# Patient Record
Sex: Female | Born: 1950 | Race: Black or African American | Hispanic: No | Marital: Single | Smoking: Never smoker
Health system: Southern US, Community
[De-identification: ages and names within clinical notes are randomized; demographics above are authoritative.]

## PROBLEM LIST (undated history)

## (undated) DIAGNOSIS — N289 Disorder of kidney and ureter, unspecified: Secondary | ICD-10-CM

## (undated) DIAGNOSIS — J45909 Unspecified asthma, uncomplicated: Secondary | ICD-10-CM

## (undated) DIAGNOSIS — E119 Type 2 diabetes mellitus without complications: Secondary | ICD-10-CM

## (undated) DIAGNOSIS — I10 Essential (primary) hypertension: Secondary | ICD-10-CM

## (undated) DIAGNOSIS — H548 Legal blindness, as defined in USA: Secondary | ICD-10-CM

## (undated) HISTORY — PX: HYSTERECTOMY ABDOMINAL WITH SALPINGECTOMY: SHX6725

## (undated) HISTORY — PX: LEG SURGERY: SHX1003

## (undated) HISTORY — PX: ABDOMINAL HYSTERECTOMY: SHX81

## (undated) SURGERY — A/V SHUNTOGRAM/FISTULAGRAM
Anesthesia: Moderate Sedation | Laterality: Left

---

## 1999-08-12 HISTORY — PX: OTHER SURGICAL HISTORY: SHX169

## 2014-12-21 ENCOUNTER — Other Ambulatory Visit
Admission: RE | Admit: 2014-12-21 | Discharge: 2014-12-21 | Disposition: A | Discharge status: Home / Self Care | Payer: Medicare Other | Attending: Nephrology | Admitting: Nephrology

## 2014-12-21 DIAGNOSIS — D689 Coagulation defect, unspecified: Secondary | ICD-10-CM | POA: Diagnosis present

## 2014-12-21 DIAGNOSIS — N186 End stage renal disease: Secondary | ICD-10-CM | POA: Insufficient documentation

## 2014-12-21 LAB — PROTIME-INR
INR: 2.01
Prothrombin Time: 22.9 seconds — ABNORMAL HIGH (ref 11.4–15.0)

## 2015-01-08 ENCOUNTER — Emergency Department
Admission: EM | Admit: 2015-01-08 | Discharge: 2015-01-08 | Disposition: A | Discharge status: Home / Self Care | Payer: Medicare Other | Attending: Emergency Medicine | Admitting: Emergency Medicine

## 2015-01-08 ENCOUNTER — Encounter: Payer: Self-pay | Admitting: Emergency Medicine

## 2015-01-08 DIAGNOSIS — N186 End stage renal disease: Secondary | ICD-10-CM | POA: Diagnosis not present

## 2015-01-08 DIAGNOSIS — Z992 Dependence on renal dialysis: Secondary | ICD-10-CM | POA: Insufficient documentation

## 2015-01-08 DIAGNOSIS — T82591A Other mechanical complication of surgically created arteriovenous shunt, initial encounter: Secondary | ICD-10-CM

## 2015-01-08 DIAGNOSIS — I120 Hypertensive chronic kidney disease with stage 5 chronic kidney disease or end stage renal disease: Secondary | ICD-10-CM | POA: Diagnosis not present

## 2015-01-08 DIAGNOSIS — Y841 Kidney dialysis as the cause of abnormal reaction of the patient, or of later complication, without mention of misadventure at the time of the procedure: Secondary | ICD-10-CM | POA: Diagnosis not present

## 2015-01-08 DIAGNOSIS — T8242XA Displacement of vascular dialysis catheter, initial encounter: Secondary | ICD-10-CM | POA: Diagnosis present

## 2015-01-08 DIAGNOSIS — E119 Type 2 diabetes mellitus without complications: Secondary | ICD-10-CM | POA: Insufficient documentation

## 2015-01-08 HISTORY — DX: Type 2 diabetes mellitus without complications: E11.9

## 2015-01-08 HISTORY — DX: Legal blindness, as defined in USA: H54.8

## 2015-01-08 HISTORY — DX: Disorder of kidney and ureter, unspecified: N28.9

## 2015-01-08 HISTORY — DX: Essential (primary) hypertension: I10

## 2015-01-08 HISTORY — DX: Unspecified asthma, uncomplicated: J45.909

## 2015-01-08 LAB — CBC WITH DIFFERENTIAL/PLATELET
BASOS ABS: 0 10*3/uL (ref 0–0.1)
BASOS PCT: 1 %
Eosinophils Absolute: 0.3 10*3/uL (ref 0–0.7)
Eosinophils Relative: 5 %
HCT: 36.6 % (ref 35.0–47.0)
Hemoglobin: 11.5 g/dL — ABNORMAL LOW (ref 12.0–16.0)
LYMPHS ABS: 1.1 10*3/uL (ref 1.0–3.6)
LYMPHS PCT: 19 %
MCH: 30.1 pg (ref 26.0–34.0)
MCHC: 31.4 g/dL — ABNORMAL LOW (ref 32.0–36.0)
MCV: 96 fL (ref 80.0–100.0)
Monocytes Absolute: 0.4 10*3/uL (ref 0.2–0.9)
Monocytes Relative: 7 %
Neutro Abs: 4 10*3/uL (ref 1.4–6.5)
Neutrophils Relative %: 68 %
Platelets: 121 10*3/uL — ABNORMAL LOW (ref 150–440)
RBC: 3.82 MIL/uL (ref 3.80–5.20)
RDW: 17.4 % — ABNORMAL HIGH (ref 11.5–14.5)
WBC: 5.8 10*3/uL (ref 3.6–11.0)

## 2015-01-08 LAB — BASIC METABOLIC PANEL
Anion gap: 13 (ref 5–15)
BUN: 64 mg/dL — ABNORMAL HIGH (ref 6–20)
CO2: 22 mmol/L (ref 22–32)
CREATININE: 8.33 mg/dL — AB (ref 0.44–1.00)
Calcium: 7.8 mg/dL — ABNORMAL LOW (ref 8.9–10.3)
Chloride: 91 mmol/L — ABNORMAL LOW (ref 101–111)
GFR calc Af Amer: 5 mL/min — ABNORMAL LOW (ref >60)
GFR calc non Af Amer: 4 mL/min — ABNORMAL LOW (ref >60)
GLUCOSE: 235 mg/dL — AB (ref 65–99)
POTASSIUM: 3.9 mmol/L (ref 3.5–5.1)
SODIUM: 126 mmol/L — AB (ref 135–145)
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
## 2015-01-08 NOTE — ED Notes (Signed)
Respiratory at bedside for blood draw and arterial stick. Family updated.

## 2015-01-08 NOTE — Discharge Instructions (Signed)
Your case was discussed with vascular surgery.  You need to return to the hospital tomorrow so that they can address the issues with the shunt. Tomorrow they can de-clotted the shunt. Return at 8 AM to preprocedure area here at [facility]. If you have any difficulty making at that time you need to call the vascular surgeon's office. The phone number is included in these instructions.  Return to the emergency department if you have any other urgent concerns.  Dialysis Vascular Access Malfunction A vascular access is an entrance to your blood vessels that can be used for dialysis. A vascular access can be made in one of several ways:   Joining an artery to a vein under your skin to make a bigger blood vessel called a fistula.   Joining an artery to a vein under your skin using a soft tube called a graft.   Placing a thin, flexible tube (catheter) in a large vein, usually in your neck.  A vascular access may malfunction or become blocked.  WHAT CAN CAUSE YOUR VASCULAR ACCESS TO MALFUNCTION?  Infection (common).   A blood clot inside a part of the fistula, graft, or catheter. A blood clot can completely or partially block the flow of blood.   A kink in the graft or catheter.   A collection of blood (called a hematoma or bruise) next to the graft or catheter that pushes against it, blocking the flow of blood.  WHAT ARE SIGNS AND SYMPTOMS OF VASCULAR ACCESS MALFUNCTION?  There is a change in the vibration or pulse of your fistula or graft.  The vibration or pulse of your fistula or graft is gone.   There is new or unusual swelling of the area around the access.   There was an unsuccessful puncture of your access by the dialysis team.   The flow of blood through the fistula, graft, or catheter is too slow for effective dialysis.   When routine dialysis is completed and the needle is removed, bleeding lasts for too long a time.  WHAT HAPPENS IF MY VASCULAR ACCESS  MALFUNCTIONS? Your health care provider may order blood work, cultures, or an X-ray test in order to learn what may be wrong with your vascular access. The X-ray test involves the injection of a liquid into the vascular access. The liquid shows up on the X-ray and allows your health care provider to see if there is a blockage in the vascular access.  Treatment varies depending on the cause of the malfunction:   If the vascular access is infected, your health care provider may prescribe antibiotic medicine to control the infection.   If a clot is found in the vascular access, you may need surgery to remove the clot.   If a blockage in the vascular access is due to some other cause (such as a kink in a graft), then you will likely need surgery to unblock or replace the graft.  HOME CARE INSTRUCTIONS: Follow up with your surgeon or other health care provider if you were instructed to do so. This is very important. Any delay in follow-up could cause permanent dysfunction of the vascular access, which may be dangerous.  SEEK MEDICAL CARE IF:   Fever develops.   Swelling and pain around the vascular access gets worse or new pain develops.  Pain, numbness, or an unusual pale skin color develops in the hand on the side of your vascular access. SEEK IMMEDIATE MEDICAL CARE IF: Unusual bleeding develops at the  location of the vascular access. MAKE SURE YOU:  Understand these instructions.  Will watch your condition.  Will get help right away if you are not doing well or get worse. Document Released: 06/30/2006 Document Revised: 12/12/2013 Document Reviewed: 12/30/2012 [facility] Patient Information 2015 [place], [place]. This information is not intended to replace advice given to you by your health care provider. Make sure you discuss any questions you have with your health care provider.

## 2015-01-08 NOTE — ED Provider Notes (Signed)
Recent Results (from the past 2160 hour(s))  Protime-INR     Status: Abnormal   Collection Time: 12/21/14 11:16 AM  Result Value Ref Range   Prothrombin Time 22.9 (H) 11.4 - 15.0 seconds   INR 2.01   Basic metabolic panel     Status: Abnormal   Collection Time: 01/08/15  5:18 PM  Result Value Ref Range   Sodium 126 (L) 135 - 145 mmol/L   Potassium 3.9 3.5 - 5.1 mmol/L   Chloride 91 (L) 101 - 111 mmol/L   CO2 22 22 - 32 mmol/L   Glucose, Bld 235 (H) 65 - 99 mg/dL   BUN 64 (H) 6 - 20 mg/dL   Creatinine, Ser 8.33 (H) 0.44 - 1.00 mg/dL   Calcium 7.8 (L) 8.9 - 10.3 mg/dL   GFR calc non Af Amer 4 (L) >60 mL/min   GFR calc Af Amer 5 (L) >60 mL/min    Comment: (NOTE) The eGFR has been calculated using the CKD EPI equation. This calculation has not been validated in all clinical situations. eGFR's persistently <60 mL/min signify possible Chronic Kidney Disease.    Anion gap 13 5 - 15  Blood gas, arterial     Status: Abnormal (Preliminary result)   Collection Time: 01/08/15  5:40 PM  Result Value Ref Range   FIO2 0.21 %   pH, Arterial 7.44 7.350 - 7.450   pCO2 arterial 37 32.0 - 48.0 mmHg   pO2, Arterial 101 83.0 - 108.0 mmHg   Bicarbonate 25.1 21.0 - 28.0 mEq/L   Acid-Base Excess 1.1 0.0 - 3.0 mmol/L   O2 Saturation 98.0 %   Patient temperature 37.0    Collection site RIGHT RADIAL    Sample type ARTERIAL DRAW    Allens test (pass/fail) POSITIVE (A) PASS   Mechanical Rate PENDING     Patient received in checkout by Dr. Kaminski, labs are stable for her to follow-up in the morning to have her shunt declotted. She is no acute distress and is agreeable with this plan. Vital signs are stable.  Jonathan E Williams, MD 01/08/15 1801 

## 2015-01-09 ENCOUNTER — Encounter: Payer: Self-pay | Admitting: *Deleted

## 2015-01-09 ENCOUNTER — Ambulatory Visit
Admission: RE | Admit: 2015-01-09 | Discharge: 2015-01-09 | Disposition: A | Discharge status: Home / Self Care | Payer: Medicare Other | Source: Ambulatory Visit | Attending: Vascular Surgery | Admitting: Vascular Surgery

## 2015-01-09 DIAGNOSIS — N186 End stage renal disease: Secondary | ICD-10-CM | POA: Insufficient documentation

## 2015-01-09 LAB — BLOOD GAS, ARTERIAL
ACID-BASE EXCESS: 1.1 mmol/L (ref 0.0–3.0)
Allens test (pass/fail): POSITIVE — AB
BICARBONATE: 25.1 meq/L (ref 21.0–28.0)
FIO2: 0.21 %
O2 SAT: 98 %
PATIENT TEMPERATURE: 37
PCO2 ART: 37 mmHg (ref 32.0–48.0)
PH ART: 7.44 (ref 7.350–7.450)
PO2 ART: 101 mmHg (ref 83.0–108.0)

## 2015-01-09 LAB — BASIC METABOLIC PANEL
Anion gap: 15 (ref 5–15)
BUN: 78 mg/dL — ABNORMAL HIGH (ref 6–20)
CALCIUM: 8.6 mg/dL — AB (ref 8.9–10.3)
CO2: 26 mmol/L (ref 22–32)
Chloride: 97 mmol/L — ABNORMAL LOW (ref 101–111)
Creatinine, Ser: 9.66 mg/dL — ABNORMAL HIGH (ref 0.44–1.00)
GFR calc Af Amer: 4 mL/min — ABNORMAL LOW (ref >60)
GFR calc non Af Amer: 4 mL/min — ABNORMAL LOW (ref >60)
Glucose, Bld: 143 mg/dL — ABNORMAL HIGH (ref 65–99)
Potassium: 4.7 mmol/L (ref 3.5–5.1)
Sodium: 138 mmol/L (ref 135–145)

## 2015-01-09 LAB — PROTIME-INR
INR: 1.28
PROTHROMBIN TIME: 16.2 s — AB (ref 11.4–15.0)

## 2015-01-09 MED ORDER — SODIUM CHLORIDE 0.9 % IV SOLN: INTRAVENOUS | Status: DC

## 2015-01-09 MED ORDER — HYDROMORPHONE HCL 1 MG/ML IJ SOLN: 1.0000 mg | INTRAMUSCULAR | Status: DC | PRN

## 2015-01-09 MED ORDER — ONDANSETRON HCL 4 MG/2ML IJ SOLN: 4.0000 mg | INTRAMUSCULAR | Status: DC | PRN

## 2015-01-09 MED ORDER — CEFAZOLIN SODIUM 1-5 GM-% IV SOLN: 1.0000 g | Freq: Once | INTRAVENOUS | Status: DC

## 2015-01-09 MED ORDER — DEXTROSE 50 % IV SOLN: 0.5000 | Freq: Once | INTRAVENOUS | Status: DC | PRN

## 2015-01-10 ENCOUNTER — Encounter: Payer: Self-pay | Admitting: *Deleted

## 2015-01-10 ENCOUNTER — Ambulatory Visit
Admission: RE | Admit: 2015-01-10 | Discharge: 2015-01-10 | Disposition: A | Discharge status: Home / Self Care | Payer: Medicare Other | Source: Ambulatory Visit | Attending: Vascular Surgery | Admitting: Vascular Surgery

## 2015-01-10 ENCOUNTER — Encounter
Admission: RE | Disposition: A | Discharge status: Home / Self Care | Payer: Self-pay | Source: Ambulatory Visit | Attending: Vascular Surgery

## 2015-01-10 DIAGNOSIS — I120 Hypertensive chronic kidney disease with stage 5 chronic kidney disease or end stage renal disease: Secondary | ICD-10-CM | POA: Diagnosis not present

## 2015-01-10 DIAGNOSIS — Z794 Long term (current) use of insulin: Secondary | ICD-10-CM | POA: Diagnosis not present

## 2015-01-10 DIAGNOSIS — Z79899 Other long term (current) drug therapy: Secondary | ICD-10-CM | POA: Diagnosis not present

## 2015-01-10 DIAGNOSIS — T82898A Other specified complication of vascular prosthetic devices, implants and grafts, initial encounter: Secondary | ICD-10-CM | POA: Insufficient documentation

## 2015-01-10 DIAGNOSIS — N186 End stage renal disease: Secondary | ICD-10-CM

## 2015-01-10 DIAGNOSIS — E119 Type 2 diabetes mellitus without complications: Secondary | ICD-10-CM | POA: Diagnosis not present

## 2015-01-10 DIAGNOSIS — H548 Legal blindness, as defined in USA: Secondary | ICD-10-CM | POA: Insufficient documentation

## 2015-01-10 DIAGNOSIS — Z992 Dependence on renal dialysis: Secondary | ICD-10-CM | POA: Insufficient documentation

## 2015-01-10 DIAGNOSIS — J45909 Unspecified asthma, uncomplicated: Secondary | ICD-10-CM | POA: Insufficient documentation

## 2015-01-10 HISTORY — PX: PERIPHERAL VASCULAR CATHETERIZATION: SHX172C

## 2015-01-10 LAB — GLUCOSE, CAPILLARY: Glucose-Capillary: 90 mg/dL (ref 65–99)

## 2015-01-10 LAB — PROTIME-INR
INR: 1.17
Prothrombin Time: 15.1 seconds — ABNORMAL HIGH (ref 11.4–15.0)

## 2015-01-10 LAB — POTASSIUM (ARMC VASCULAR LAB ONLY): Potassium (ARMC vascular lab): 4.9

## 2015-01-10 SURGERY — A/V SHUNTOGRAM/FISTULAGRAM
Anesthesia: Moderate Sedation

## 2015-01-10 MED ORDER — GUAIFENESIN-DM 100-10 MG/5ML PO SYRP: 15.0000 mL | ORAL_SOLUTION | ORAL | Status: DC | PRN

## 2015-01-10 MED ORDER — FENTANYL CITRATE (PF) 100 MCG/2ML IJ SOLN
INTRAMUSCULAR | Status: AC
  Filled 2015-01-10: qty 2

## 2015-01-10 MED ORDER — CLINDAMYCIN PHOSPHATE 300 MG/50ML IV SOLN: 300.0000 mg | Freq: Once | INTRAVENOUS | Status: DC

## 2015-01-10 MED ORDER — ALUM & MAG HYDROXIDE-SIMETH 200-200-20 MG/5ML PO SUSP: 15.0000 mL | ORAL | Status: DC | PRN

## 2015-01-10 MED ORDER — STERILE WATER FOR INJECTION IJ SOLN
INTRAMUSCULAR | Status: AC
  Filled 2015-01-10: qty 10

## 2015-01-10 MED ORDER — ONDANSETRON HCL 4 MG/2ML IJ SOLN: 4.0000 mg | Freq: Four times a day (QID) | INTRAMUSCULAR | Status: DC | PRN

## 2015-01-10 MED ORDER — MIDAZOLAM HCL 5 MG/5ML IJ SOLN
INTRAMUSCULAR | Status: AC
  Filled 2015-01-10: qty 5

## 2015-01-10 MED ORDER — BIVALIRUDIN BOLUS VIA INFUSION - CUPID
INTRAVENOUS | Status: DC | PRN
  Administered 2015-01-10: 136.05 mg via INTRAVENOUS

## 2015-01-10 MED ORDER — SODIUM CHLORIDE 0.9 % IJ SOLN
INTRAMUSCULAR | Status: AC
  Filled 2015-01-10: qty 15

## 2015-01-10 MED ORDER — PANTOPRAZOLE SODIUM 40 MG PO TBEC: 40.0000 mg | DELAYED_RELEASE_TABLET | Freq: Every day | ORAL | Status: DC

## 2015-01-10 MED ORDER — CEFAZOLIN SODIUM 1-5 GM-% IV SOLN
INTRAVENOUS | Status: AC
  Filled 2015-01-10: qty 50

## 2015-01-10 MED ORDER — PHENOL 1.4 % MT LIQD: 1.0000 | OROMUCOSAL | Status: DC | PRN

## 2015-01-10 MED ORDER — CLINDAMYCIN PHOSPHATE 300 MG/50ML IV SOLN
300.0000 mg | Freq: Once | INTRAVENOUS | Status: AC
Start: 2015-01-10 — End: 2015-01-10
  Administered 2015-01-10: 300 mg via INTRAVENOUS

## 2015-01-10 MED ORDER — FENTANYL CITRATE (PF) 100 MCG/2ML IJ SOLN
INTRAMUSCULAR | Status: DC | PRN
  Administered 2015-01-10: 100 ug via INTRAVENOUS

## 2015-01-10 MED ORDER — LIDOCAINE HCL (PF) 1 % IJ SOLN
INTRAMUSCULAR | Status: DC | PRN
  Administered 2015-01-10: 10 mL via INTRADERMAL

## 2015-01-10 MED ORDER — DOCUSATE SODIUM 100 MG PO CAPS: 100.0000 mg | ORAL_CAPSULE | Freq: Every day | ORAL | Status: DC

## 2015-01-10 MED ORDER — IOHEXOL 300 MG/ML  SOLN
INTRAMUSCULAR | Status: DC | PRN
  Administered 2015-01-10: 25 mL via INTRAVENOUS

## 2015-01-10 MED ORDER — ACETAMINOPHEN 325 MG PO TABS: 325.0000 mg | ORAL_TABLET | ORAL | Status: DC | PRN

## 2015-01-10 MED ORDER — LIDOCAINE HCL (PF) 1 % IJ SOLN
INTRAMUSCULAR | Status: AC
  Filled 2015-01-10: qty 10

## 2015-01-10 MED ORDER — METOPROLOL TARTRATE 1 MG/ML IV SOLN: 2.0000 mg | INTRAVENOUS | Status: DC | PRN

## 2015-01-10 MED ORDER — HYDRALAZINE HCL 20 MG/ML IJ SOLN: 5.0000 mg | INTRAMUSCULAR | Status: DC | PRN

## 2015-01-10 MED ORDER — ACETAMINOPHEN 325 MG RE SUPP
325.0000 mg | RECTAL | Status: DC | PRN
  Filled 2015-01-10: qty 2

## 2015-01-10 MED ORDER — CLINDAMYCIN PHOSPHATE 300 MG/50ML IV SOLN
INTRAVENOUS | Status: AC
  Administered 2015-01-10: 300 mg
  Filled 2015-01-10: qty 50

## 2015-01-10 MED ORDER — HEPARIN (PORCINE) IN NACL 2-0.9 UNIT/ML-% IJ SOLN
INTRAMUSCULAR | Status: AC
  Filled 2015-01-10: qty 1000

## 2015-01-10 MED ORDER — LABETALOL HCL 5 MG/ML IV SOLN: 10.0000 mg | INTRAVENOUS | Status: DC | PRN

## 2015-01-10 MED ORDER — MIDAZOLAM HCL 2 MG/2ML IJ SOLN
INTRAMUSCULAR | Status: DC | PRN
  Administered 2015-01-10: 4 mg via INTRAVENOUS

## 2015-01-10 MED ORDER — SODIUM CHLORIDE 0.9 % IV SOLN
INTRAVENOUS | Status: DC
  Administered 2015-01-10: 09:00:00 via INTRAVENOUS

## 2015-01-10 MED ORDER — HEPARIN SODIUM (PORCINE) 1000 UNIT/ML IJ SOLN
INTRAMUSCULAR | Status: AC
  Filled 2015-01-10: qty 1

## 2015-01-10 MED ORDER — BIVALIRUDIN 250 MG IV SOLR
INTRAVENOUS | Status: AC
  Filled 2015-01-10: qty 250

## 2015-01-10 SURGICAL SUPPLY — 11 items
BALLN LUTONIX DCB 7X40X130 (BALLOONS) ×4
BALLN ULTRVRSE 8X40X75C (BALLOONS) ×4
BALLOON LUTONIX DCB 7X40X130 (BALLOONS) ×2 IMPLANT
BALLOON ULTRVRSE 8X40X75C (BALLOONS) ×2 IMPLANT
DEVICE PRESTO INFLATION (MISCELLANEOUS) ×4 IMPLANT
DRAPE BRACHIAL (DRAPES) ×4 IMPLANT
PACK ANGIOGRAPHY (CUSTOM PROCEDURE TRAY) ×4 IMPLANT
SET INTRO CAPELLA COAXIAL (SET/KITS/TRAYS/PACK) ×4 IMPLANT
SHEATH BRITE TIP 6FRX5.5 (SHEATH) ×4 IMPLANT
TOWEL OR 17X26 4PK STRL BLUE (TOWEL DISPOSABLE) ×4 IMPLANT
WIRE MAGIC TOR.035 180C (WIRE) ×4 IMPLANT
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
## 2015-12-28 ENCOUNTER — Ambulatory Visit: Payer: Medicare Other | Attending: Neurology

## 2015-12-28 DIAGNOSIS — G4733 Obstructive sleep apnea (adult) (pediatric): Secondary | ICD-10-CM | POA: Insufficient documentation

## 2016-09-15 ENCOUNTER — Emergency Department
Admission: EM | Admit: 2016-09-15 | Discharge: 2016-09-15 | Disposition: A | Discharge status: Home / Self Care | Payer: Medicare Other | Attending: Emergency Medicine | Admitting: Emergency Medicine

## 2016-09-15 ENCOUNTER — Encounter: Payer: Self-pay | Admitting: Emergency Medicine

## 2016-09-15 DIAGNOSIS — I10 Essential (primary) hypertension: Secondary | ICD-10-CM | POA: Diagnosis not present

## 2016-09-15 DIAGNOSIS — E119 Type 2 diabetes mellitus without complications: Secondary | ICD-10-CM | POA: Diagnosis not present

## 2016-09-15 DIAGNOSIS — T8249XA Other complication of vascular dialysis catheter, initial encounter: Secondary | ICD-10-CM | POA: Diagnosis not present

## 2016-09-15 DIAGNOSIS — Y732 Prosthetic and other implants, materials and accessory gastroenterology and urology devices associated with adverse incidents: Secondary | ICD-10-CM | POA: Diagnosis not present

## 2016-09-15 DIAGNOSIS — Z794 Long term (current) use of insulin: Secondary | ICD-10-CM | POA: Insufficient documentation

## 2016-09-15 DIAGNOSIS — Z79899 Other long term (current) drug therapy: Secondary | ICD-10-CM | POA: Diagnosis not present

## 2016-09-15 DIAGNOSIS — Z7901 Long term (current) use of anticoagulants: Secondary | ICD-10-CM | POA: Insufficient documentation

## 2016-09-15 DIAGNOSIS — J45909 Unspecified asthma, uncomplicated: Secondary | ICD-10-CM | POA: Diagnosis not present

## 2016-09-15 DIAGNOSIS — Z789 Other specified health status: Secondary | ICD-10-CM

## 2016-09-15 LAB — CBC WITH DIFFERENTIAL/PLATELET
Basophils Absolute: 0.1 10*3/uL (ref 0–0.1)
Basophils Relative: 1 %
EOS ABS: 0.2 10*3/uL (ref 0–0.7)
Eosinophils Relative: 2 %
HCT: 34.3 % — ABNORMAL LOW (ref 35.0–47.0)
Hemoglobin: 11.3 g/dL — ABNORMAL LOW (ref 12.0–16.0)
Lymphocytes Relative: 11 %
Lymphs Abs: 0.9 10*3/uL — ABNORMAL LOW (ref 1.0–3.6)
MCH: 34 pg (ref 26.0–34.0)
MCHC: 32.8 g/dL (ref 32.0–36.0)
MCV: 103.5 fL — ABNORMAL HIGH (ref 80.0–100.0)
Monocytes Absolute: 0.6 10*3/uL (ref 0.2–0.9)
Monocytes Relative: 8 %
Neutro Abs: 6.2 10*3/uL (ref 1.4–6.5)
Neutrophils Relative %: 78 %
Platelets: 147 10*3/uL — ABNORMAL LOW (ref 150–440)
RBC: 3.31 MIL/uL — ABNORMAL LOW (ref 3.80–5.20)
RDW: 15.3 % — ABNORMAL HIGH (ref 11.5–14.5)
WBC: 7.9 10*3/uL (ref 3.6–11.0)

## 2016-09-15 LAB — GLUCOSE, CAPILLARY: GLUCOSE-CAPILLARY: 71 mg/dL (ref 65–99)

## 2016-09-15 MED ORDER — CLOPIDOGREL BISULFATE 75 MG PO TABS: 300.0000 mg | ORAL_TABLET | Freq: Once | ORAL | Status: DC

## 2016-09-15 NOTE — ED Notes (Addendum)
Dialysis called at this time to d/c fistula

## 2016-09-15 NOTE — Discharge Instructions (Signed)
Suture removal in 7 days. The sutures were established to tie off for a small arterial bleeder remote from your vascular access site. Please continue with the dialysis as scheduled. You may require evaluation by vascular surgeon.
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
## 2016-09-15 NOTE — ED Triage Notes (Addendum)
Pt to ED via EMS from dialysis, [facility], fistula site has been bleeding aprox 1.5hrs , pt received dialysis treatment. Bleeding is controlled by direct pressure at this time

## 2016-09-15 NOTE — ED Notes (Signed)
Bleeding controlled at this time, dressing is clean and dry. Sandwich tray given at this time per MD

## 2016-09-15 NOTE — ED Notes (Signed)
2nd surgoceal placed on wound at this time, gauze and curlex applied per MD orders

## 2016-09-16 ENCOUNTER — Other Ambulatory Visit
Admission: RE | Admit: 2016-09-16 | Discharge: 2016-09-16 | Disposition: A | Discharge status: Home / Self Care | Payer: Medicare Other | Source: Ambulatory Visit | Attending: Nephrology | Admitting: Nephrology

## 2016-09-16 ENCOUNTER — Emergency Department
Admission: EM | Admit: 2016-09-16 | Discharge: 2016-09-16 | Discharge status: Absent without leave | Payer: Medicare Other

## 2016-09-16 DIAGNOSIS — D689 Coagulation defect, unspecified: Secondary | ICD-10-CM | POA: Diagnosis present

## 2016-09-16 LAB — PROTIME-INR
INR: 3
PROTHROMBIN TIME: 31.8 s — AB (ref 11.4–15.2)

## 2016-09-16 NOTE — ED Notes (Signed)
Pt reports her Dialysis MD wanted her to have PT/INR done prior to dialysis. Pt had outpt order and did not need to be seen in ED.

## 2020-02-28 ENCOUNTER — Encounter (HOSPITAL_COMMUNITY): Payer: Self-pay | Admitting: Emergency Medicine

## 2020-02-28 ENCOUNTER — Inpatient Hospital Stay (HOSPITAL_COMMUNITY)
Admission: EM | Admit: 2020-02-28 | Discharge: 2020-03-09 | DRG: 562 | Disposition: A | Discharge status: Skilled Nursing Facility | Payer: Medicare Other | Attending: Family Medicine | Admitting: Family Medicine

## 2020-02-28 ENCOUNTER — Emergency Department (HOSPITAL_COMMUNITY): Payer: Medicare Other

## 2020-02-28 DIAGNOSIS — H548 Legal blindness, as defined in USA: Secondary | ICD-10-CM | POA: Diagnosis present

## 2020-02-28 DIAGNOSIS — E1140 Type 2 diabetes mellitus with diabetic neuropathy, unspecified: Secondary | ICD-10-CM | POA: Diagnosis not present

## 2020-02-28 DIAGNOSIS — S80211D Abrasion, right knee, subsequent encounter: Secondary | ICD-10-CM | POA: Diagnosis not present

## 2020-02-28 DIAGNOSIS — E875 Hyperkalemia: Secondary | ICD-10-CM

## 2020-02-28 DIAGNOSIS — M8580 Other specified disorders of bone density and structure, unspecified site: Secondary | ICD-10-CM | POA: Diagnosis present

## 2020-02-28 DIAGNOSIS — S82231B Displaced oblique fracture of shaft of right tibia, initial encounter for open fracture type I or II: Secondary | ICD-10-CM | POA: Diagnosis present

## 2020-02-28 DIAGNOSIS — E1122 Type 2 diabetes mellitus with diabetic chronic kidney disease: Secondary | ICD-10-CM | POA: Diagnosis not present

## 2020-02-28 DIAGNOSIS — I9589 Other hypotension: Secondary | ICD-10-CM | POA: Diagnosis present

## 2020-02-28 DIAGNOSIS — N250 Renal osteodystrophy: Secondary | ICD-10-CM | POA: Diagnosis not present

## 2020-02-28 DIAGNOSIS — S82409A Unspecified fracture of shaft of unspecified fibula, initial encounter for closed fracture: Secondary | ICD-10-CM | POA: Diagnosis present

## 2020-02-28 DIAGNOSIS — I120 Hypertensive chronic kidney disease with stage 5 chronic kidney disease or end stage renal disease: Secondary | ICD-10-CM | POA: Diagnosis not present

## 2020-02-28 DIAGNOSIS — Z7901 Long term (current) use of anticoagulants: Secondary | ICD-10-CM

## 2020-02-28 DIAGNOSIS — E662 Morbid (severe) obesity with alveolar hypoventilation: Secondary | ICD-10-CM | POA: Diagnosis not present

## 2020-02-28 DIAGNOSIS — Z6841 Body Mass Index (BMI) 40.0 and over, adult: Secondary | ICD-10-CM | POA: Diagnosis not present

## 2020-02-28 DIAGNOSIS — Z23 Encounter for immunization: Secondary | ICD-10-CM | POA: Diagnosis not present

## 2020-02-28 DIAGNOSIS — K5900 Constipation, unspecified: Secondary | ICD-10-CM | POA: Diagnosis not present

## 2020-02-28 DIAGNOSIS — N2581 Secondary hyperparathyroidism of renal origin: Secondary | ICD-10-CM | POA: Diagnosis present

## 2020-02-28 DIAGNOSIS — J45909 Unspecified asthma, uncomplicated: Secondary | ICD-10-CM | POA: Diagnosis not present

## 2020-02-28 DIAGNOSIS — Z79899 Other long term (current) drug therapy: Secondary | ICD-10-CM

## 2020-02-28 DIAGNOSIS — S82101A Unspecified fracture of upper end of right tibia, initial encounter for closed fracture: Secondary | ICD-10-CM

## 2020-02-28 DIAGNOSIS — Z885 Allergy status to narcotic agent status: Secondary | ICD-10-CM | POA: Diagnosis not present

## 2020-02-28 DIAGNOSIS — Z20822 Contact with and (suspected) exposure to covid-19: Secondary | ICD-10-CM | POA: Diagnosis present

## 2020-02-28 DIAGNOSIS — S82191A Other fracture of upper end of right tibia, initial encounter for closed fracture: Secondary | ICD-10-CM | POA: Diagnosis not present

## 2020-02-28 DIAGNOSIS — R0902 Hypoxemia: Secondary | ICD-10-CM | POA: Diagnosis not present

## 2020-02-28 DIAGNOSIS — E11620 Type 2 diabetes mellitus with diabetic dermatitis: Secondary | ICD-10-CM | POA: Diagnosis not present

## 2020-02-28 DIAGNOSIS — I953 Hypotension of hemodialysis: Secondary | ICD-10-CM | POA: Diagnosis present

## 2020-02-28 DIAGNOSIS — S82401A Unspecified fracture of shaft of right fibula, initial encounter for closed fracture: Secondary | ICD-10-CM | POA: Diagnosis not present

## 2020-02-28 DIAGNOSIS — Y92009 Unspecified place in unspecified non-institutional (private) residence as the place of occurrence of the external cause: Secondary | ICD-10-CM | POA: Diagnosis not present

## 2020-02-28 DIAGNOSIS — I2699 Other pulmonary embolism without acute cor pulmonale: Secondary | ICD-10-CM | POA: Diagnosis present

## 2020-02-28 DIAGNOSIS — D631 Anemia in chronic kidney disease: Secondary | ICD-10-CM | POA: Diagnosis present

## 2020-02-28 DIAGNOSIS — S82831A Other fracture of upper and lower end of right fibula, initial encounter for closed fracture: Secondary | ICD-10-CM | POA: Diagnosis not present

## 2020-02-28 DIAGNOSIS — N186 End stage renal disease: Secondary | ICD-10-CM | POA: Diagnosis not present

## 2020-02-28 DIAGNOSIS — Z794 Long term (current) use of insulin: Secondary | ICD-10-CM

## 2020-02-28 DIAGNOSIS — Z90710 Acquired absence of both cervix and uterus: Secondary | ICD-10-CM

## 2020-02-28 DIAGNOSIS — S31119A Laceration without foreign body of abdominal wall, unspecified quadrant without penetration into peritoneal cavity, initial encounter: Secondary | ICD-10-CM | POA: Diagnosis not present

## 2020-02-28 DIAGNOSIS — Z880 Allergy status to penicillin: Secondary | ICD-10-CM | POA: Diagnosis not present

## 2020-02-28 DIAGNOSIS — S82431A Displaced oblique fracture of shaft of right fibula, initial encounter for closed fracture: Secondary | ICD-10-CM | POA: Diagnosis present

## 2020-02-28 DIAGNOSIS — S80211A Abrasion, right knee, initial encounter: Secondary | ICD-10-CM | POA: Diagnosis not present

## 2020-02-28 DIAGNOSIS — Z992 Dependence on renal dialysis: Secondary | ICD-10-CM

## 2020-02-28 DIAGNOSIS — Z888 Allergy status to other drugs, medicaments and biological substances status: Secondary | ICD-10-CM

## 2020-02-28 DIAGNOSIS — E876 Hypokalemia: Secondary | ICD-10-CM | POA: Diagnosis present

## 2020-02-28 DIAGNOSIS — S82201A Unspecified fracture of shaft of right tibia, initial encounter for closed fracture: Secondary | ICD-10-CM | POA: Diagnosis present

## 2020-02-28 DIAGNOSIS — D696 Thrombocytopenia, unspecified: Secondary | ICD-10-CM | POA: Diagnosis not present

## 2020-02-28 DIAGNOSIS — L299 Pruritus, unspecified: Secondary | ICD-10-CM | POA: Diagnosis present

## 2020-02-28 DIAGNOSIS — I2601 Septic pulmonary embolism with acute cor pulmonale: Secondary | ICD-10-CM | POA: Diagnosis not present

## 2020-02-28 DIAGNOSIS — Z86711 Personal history of pulmonary embolism: Secondary | ICD-10-CM

## 2020-02-28 DIAGNOSIS — D539 Nutritional anemia, unspecified: Secondary | ICD-10-CM

## 2020-02-28 DIAGNOSIS — E1165 Type 2 diabetes mellitus with hyperglycemia: Secondary | ICD-10-CM | POA: Diagnosis not present

## 2020-02-28 DIAGNOSIS — E8889 Other specified metabolic disorders: Secondary | ICD-10-CM | POA: Diagnosis present

## 2020-02-28 DIAGNOSIS — E66813 Obesity, class 3: Secondary | ICD-10-CM

## 2020-02-28 DIAGNOSIS — Z993 Dependence on wheelchair: Secondary | ICD-10-CM

## 2020-02-28 DIAGNOSIS — J4520 Mild intermittent asthma, uncomplicated: Secondary | ICD-10-CM | POA: Diagnosis not present

## 2020-02-28 DIAGNOSIS — M818 Other osteoporosis without current pathological fracture: Secondary | ICD-10-CM | POA: Diagnosis present

## 2020-02-28 LAB — BASIC METABOLIC PANEL
Anion gap: 20 — ABNORMAL HIGH (ref 5–15)
BUN: 65 mg/dL — ABNORMAL HIGH (ref 8–23)
CO2: 24 mmol/L (ref 22–32)
Calcium: 9.3 mg/dL (ref 8.9–10.3)
Chloride: 91 mmol/L — ABNORMAL LOW (ref 98–111)
Creatinine, Ser: 9.26 mg/dL — ABNORMAL HIGH (ref 0.44–1.00)
GFR calc Af Amer: 4 mL/min — ABNORMAL LOW (ref >60)
GFR calc non Af Amer: 4 mL/min — ABNORMAL LOW (ref >60)
Glucose, Bld: 262 mg/dL — ABNORMAL HIGH (ref 70–99)
Potassium: 6.2 mmol/L — ABNORMAL HIGH (ref 3.5–5.1)
Sodium: 135 mmol/L (ref 135–145)

## 2020-02-28 LAB — CBC WITH DIFFERENTIAL/PLATELET
Abs Immature Granulocytes: 0.06 10*3/uL (ref 0.00–0.07)
Basophils Absolute: 0.1 10*3/uL (ref 0.0–0.1)
Basophils Relative: 1 %
Eosinophils Absolute: 0.1 10*3/uL (ref 0.0–0.5)
Eosinophils Relative: 1 %
HCT: 32 % — ABNORMAL LOW (ref 36.0–46.0)
Hemoglobin: 9.8 g/dL — ABNORMAL LOW (ref 12.0–15.0)
Immature Granulocytes: 1 %
Lymphocytes Relative: 6 %
Lymphs Abs: 0.6 10*3/uL — ABNORMAL LOW (ref 0.7–4.0)
MCH: 32.2 pg (ref 26.0–34.0)
MCHC: 30.6 g/dL (ref 30.0–36.0)
MCV: 105.3 fL — ABNORMAL HIGH (ref 80.0–100.0)
Monocytes Absolute: 0.8 10*3/uL (ref 0.1–1.0)
Monocytes Relative: 8 %
Neutro Abs: 8.4 10*3/uL — ABNORMAL HIGH (ref 1.7–7.7)
Neutrophils Relative %: 83 %
Platelets: 148 10*3/uL — ABNORMAL LOW (ref 150–400)
RBC: 3.04 MIL/uL — ABNORMAL LOW (ref 3.87–5.11)
RDW: 16.9 % — ABNORMAL HIGH (ref 11.5–15.5)
WBC: 10 10*3/uL (ref 4.0–10.5)
nRBC: 0 % (ref 0.0–0.2)

## 2020-02-28 LAB — CBG MONITORING, ED: Glucose-Capillary: 303 mg/dL — ABNORMAL HIGH (ref 70–99)

## 2020-02-28 MED ORDER — ACETAMINOPHEN 650 MG RE SUPP: 650.0000 mg | Freq: Four times a day (QID) | RECTAL | Status: DC | PRN

## 2020-02-28 MED ORDER — ACETAMINOPHEN 325 MG PO TABS
650.0000 mg | ORAL_TABLET | Freq: Four times a day (QID) | ORAL | Status: DC | PRN
  Administered 2020-02-29 – 2020-03-02 (×2): 650 mg via ORAL
  Filled 2020-02-28: qty 2

## 2020-02-28 MED ORDER — TETANUS-DIPHTH-ACELL PERTUSSIS 5-2.5-18.5 LF-MCG/0.5 IM SUSP
0.5000 mL | Freq: Once | INTRAMUSCULAR | Status: AC
  Administered 2020-02-28: 0.5 mL via INTRAMUSCULAR
  Filled 2020-02-28: qty 0.5

## 2020-02-28 MED ORDER — TRAMADOL HCL 50 MG PO TABS
50.0000 mg | ORAL_TABLET | Freq: Once | ORAL | Status: DC
  Filled 2020-02-28: qty 1

## 2020-02-28 MED ORDER — CLINDAMYCIN PHOSPHATE 900 MG/50ML IV SOLN
900.0000 mg | Freq: Once | INTRAVENOUS | Status: AC
  Administered 2020-02-28: 900 mg via INTRAVENOUS
  Filled 2020-02-28: qty 50

## 2020-02-28 MED ORDER — FENTANYL CITRATE (PF) 100 MCG/2ML IJ SOLN
50.0000 ug | Freq: Once | INTRAMUSCULAR | Status: AC
  Administered 2020-02-28: 50 ug via INTRAVENOUS
  Filled 2020-02-28: qty 2

## 2020-02-28 NOTE — ED Notes (Signed)
Updated Tammy, the pts NA, regarding the pts care. All questions/concerns addressed at this time.
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
## 2020-02-28 NOTE — ED Notes (Signed)
Pt CBG is 303 mg/dL.
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
## 2020-02-29 ENCOUNTER — Encounter (HOSPITAL_COMMUNITY): Payer: Self-pay | Admitting: Internal Medicine

## 2020-02-29 ENCOUNTER — Other Ambulatory Visit: Payer: Self-pay

## 2020-02-29 DIAGNOSIS — S82191A Other fracture of upper end of right tibia, initial encounter for closed fracture: Secondary | ICD-10-CM

## 2020-02-29 DIAGNOSIS — J45909 Unspecified asthma, uncomplicated: Secondary | ICD-10-CM | POA: Diagnosis present

## 2020-02-29 DIAGNOSIS — S82401A Unspecified fracture of shaft of right fibula, initial encounter for closed fracture: Secondary | ICD-10-CM

## 2020-02-29 DIAGNOSIS — M8580 Other specified disorders of bone density and structure, unspecified site: Secondary | ICD-10-CM | POA: Diagnosis present

## 2020-02-29 DIAGNOSIS — S80211D Abrasion, right knee, subsequent encounter: Secondary | ICD-10-CM | POA: Diagnosis not present

## 2020-02-29 DIAGNOSIS — D539 Nutritional anemia, unspecified: Secondary | ICD-10-CM

## 2020-02-29 DIAGNOSIS — Z992 Dependence on renal dialysis: Secondary | ICD-10-CM

## 2020-02-29 DIAGNOSIS — J4520 Mild intermittent asthma, uncomplicated: Secondary | ICD-10-CM | POA: Diagnosis not present

## 2020-02-29 DIAGNOSIS — S82409A Unspecified fracture of shaft of unspecified fibula, initial encounter for closed fracture: Secondary | ICD-10-CM | POA: Diagnosis present

## 2020-02-29 DIAGNOSIS — I2699 Other pulmonary embolism without acute cor pulmonale: Secondary | ICD-10-CM | POA: Diagnosis present

## 2020-02-29 DIAGNOSIS — N186 End stage renal disease: Secondary | ICD-10-CM

## 2020-02-29 DIAGNOSIS — S82231B Displaced oblique fracture of shaft of right tibia, initial encounter for open fracture type I or II: Secondary | ICD-10-CM | POA: Diagnosis not present

## 2020-02-29 DIAGNOSIS — E1165 Type 2 diabetes mellitus with hyperglycemia: Secondary | ICD-10-CM | POA: Diagnosis present

## 2020-02-29 DIAGNOSIS — S80211A Abrasion, right knee, initial encounter: Secondary | ICD-10-CM | POA: Diagnosis present

## 2020-02-29 DIAGNOSIS — I2601 Septic pulmonary embolism with acute cor pulmonale: Secondary | ICD-10-CM

## 2020-02-29 DIAGNOSIS — E875 Hyperkalemia: Secondary | ICD-10-CM

## 2020-02-29 LAB — COMPREHENSIVE METABOLIC PANEL
ALT: 16 U/L (ref 0–44)
AST: 11 U/L — ABNORMAL LOW (ref 15–41)
Albumin: 2.9 g/dL — ABNORMAL LOW (ref 3.5–5.0)
Alkaline Phosphatase: 100 U/L (ref 38–126)
Anion gap: 17 — ABNORMAL HIGH (ref 5–15)
BUN: 64 mg/dL — ABNORMAL HIGH (ref 8–23)
CO2: 24 mmol/L (ref 22–32)
Calcium: 9.4 mg/dL (ref 8.9–10.3)
Chloride: 93 mmol/L — ABNORMAL LOW (ref 98–111)
Creatinine, Ser: 10.14 mg/dL — ABNORMAL HIGH (ref 0.44–1.00)
GFR calc Af Amer: 4 mL/min — ABNORMAL LOW (ref >60)
GFR calc non Af Amer: 3 mL/min — ABNORMAL LOW (ref >60)
Glucose, Bld: 174 mg/dL — ABNORMAL HIGH (ref 70–99)
Potassium: 5 mmol/L (ref 3.5–5.1)
Sodium: 134 mmol/L — ABNORMAL LOW (ref 135–145)
Total Bilirubin: 1.1 mg/dL (ref 0.3–1.2)
Total Protein: 7.9 g/dL (ref 6.5–8.1)

## 2020-02-29 LAB — CBC WITH DIFFERENTIAL/PLATELET
Abs Immature Granulocytes: 0.07 10*3/uL (ref 0.00–0.07)
Basophils Absolute: 0 10*3/uL (ref 0.0–0.1)
Basophils Relative: 1 %
Eosinophils Absolute: 0.2 10*3/uL (ref 0.0–0.5)
Eosinophils Relative: 2 %
HCT: 29.8 % — ABNORMAL LOW (ref 36.0–46.0)
Hemoglobin: 8.8 g/dL — ABNORMAL LOW (ref 12.0–15.0)
Immature Granulocytes: 1 %
Lymphocytes Relative: 10 %
Lymphs Abs: 0.8 10*3/uL (ref 0.7–4.0)
MCH: 30.7 pg (ref 26.0–34.0)
MCHC: 29.5 g/dL — ABNORMAL LOW (ref 30.0–36.0)
MCV: 103.8 fL — ABNORMAL HIGH (ref 80.0–100.0)
Monocytes Absolute: 0.6 10*3/uL (ref 0.1–1.0)
Monocytes Relative: 8 %
Neutro Abs: 6.2 10*3/uL (ref 1.7–7.7)
Neutrophils Relative %: 78 %
Platelets: 129 10*3/uL — ABNORMAL LOW (ref 150–400)
RBC: 2.87 MIL/uL — ABNORMAL LOW (ref 3.87–5.11)
RDW: 16.8 % — ABNORMAL HIGH (ref 11.5–15.5)
WBC: 7.9 10*3/uL (ref 4.0–10.5)
nRBC: 0 % (ref 0.0–0.2)

## 2020-02-29 LAB — GLUCOSE, CAPILLARY
Glucose-Capillary: 179 mg/dL — ABNORMAL HIGH (ref 70–99)
Glucose-Capillary: 200 mg/dL — ABNORMAL HIGH (ref 70–99)
Glucose-Capillary: 241 mg/dL — ABNORMAL HIGH (ref 70–99)

## 2020-02-29 LAB — APTT
aPTT: 92 seconds — ABNORMAL HIGH (ref 24–36)
aPTT: 87 seconds — ABNORMAL HIGH (ref 24–36)

## 2020-02-29 LAB — FOLATE: Folate: 90.1 ng/mL (ref >5.9)

## 2020-02-29 LAB — PROTIME-INR
INR: 1.4 — ABNORMAL HIGH (ref 0.8–1.2)
INR: 1.6 — ABNORMAL HIGH (ref 0.8–1.2)
Prothrombin Time: 16.6 seconds — ABNORMAL HIGH (ref 11.4–15.2)
Prothrombin Time: 18.4 seconds — ABNORMAL HIGH (ref 11.4–15.2)

## 2020-02-29 LAB — VITAMIN B12: Vitamin B-12: 1497 pg/mL — ABNORMAL HIGH (ref 180–914)

## 2020-02-29 LAB — MAGNESIUM: Magnesium: 2.4 mg/dL (ref 1.7–2.4)

## 2020-02-29 LAB — PHOSPHORUS: Phosphorus: 8.3 mg/dL — ABNORMAL HIGH (ref 2.5–4.6)

## 2020-02-29 LAB — HEPARIN LEVEL (UNFRACTIONATED): Heparin Unfractionated: >2.2 IU/mL — ABNORMAL HIGH (ref 0.30–0.70)

## 2020-02-29 LAB — GLUCOSE, RANDOM: Glucose, Bld: 220 mg/dL — ABNORMAL HIGH (ref 70–99)

## 2020-02-29 LAB — SARS CORONAVIRUS 2 BY RT PCR (HOSPITAL ORDER, PERFORMED IN ~~LOC~~ HOSPITAL LAB): SARS Coronavirus 2: NEGATIVE

## 2020-02-29 LAB — VITAMIN D 25 HYDROXY (VIT D DEFICIENCY, FRACTURES): Vit D, 25-Hydroxy: 36.71 ng/mL (ref 30–100)

## 2020-02-29 MED ORDER — ACETAMINOPHEN 325 MG PO TABS
ORAL_TABLET | ORAL | Status: AC
  Filled 2020-02-29: qty 2

## 2020-02-29 MED ORDER — CALCIUM GLUCONATE 10 % IV SOLN
1.0000 g | Freq: Once | INTRAVENOUS | Status: DC
Start: 2020-02-29 — End: 2020-02-29

## 2020-02-29 MED ORDER — CARVEDILOL 12.5 MG PO TABS
12.5000 mg | ORAL_TABLET | Freq: Two times a day (BID) | ORAL | Status: DC
  Administered 2020-03-02 – 2020-03-09 (×6): 12.5 mg via ORAL
  Filled 2020-02-29 (×16): qty 1

## 2020-02-29 MED ORDER — MIDODRINE HCL 5 MG PO TABS
10.0000 mg | ORAL_TABLET | Freq: Two times a day (BID) | ORAL | Status: DC | PRN
  Administered 2020-02-29 (×2): 10 mg via ORAL
  Filled 2020-02-29: qty 2

## 2020-02-29 MED ORDER — CHLORHEXIDINE GLUCONATE CLOTH 2 % EX PADS
6.0000 | MEDICATED_PAD | Freq: Every day | CUTANEOUS | Status: DC
  Administered 2020-02-29 – 2020-03-07 (×6): 6 via TOPICAL

## 2020-02-29 MED ORDER — PROCHLORPERAZINE EDISYLATE 10 MG/2ML IJ SOLN
5.0000 mg | Freq: Four times a day (QID) | INTRAMUSCULAR | Status: DC | PRN
  Administered 2020-02-29: 5 mg via INTRAVENOUS
  Filled 2020-02-29: qty 2

## 2020-02-29 MED ORDER — FLUTICASONE PROPIONATE 50 MCG/ACT NA SUSP: 1.0000 | Freq: Every day | NASAL | Status: DC | PRN

## 2020-02-29 MED ORDER — SODIUM ZIRCONIUM CYCLOSILICATE 10 G PO PACK
10.0000 g | PACK | Freq: Once | ORAL | Status: AC
  Administered 2020-02-29: 10 g via ORAL
  Filled 2020-02-29: qty 1

## 2020-02-29 MED ORDER — SEVELAMER CARBONATE 800 MG PO TABS
800.0000 mg | ORAL_TABLET | Freq: Three times a day (TID) | ORAL | Status: DC
  Administered 2020-02-29 – 2020-03-09 (×30): 800 mg via ORAL
  Filled 2020-02-29 (×29): qty 1

## 2020-02-29 MED ORDER — MORPHINE SULFATE (PF) 2 MG/ML IV SOLN
2.0000 mg | Freq: Once | INTRAVENOUS | Status: AC
  Administered 2020-02-29: 4 mg via INTRAVENOUS
  Filled 2020-02-29: qty 2

## 2020-02-29 MED ORDER — CALCIUM GLUCONATE-NACL 1-0.675 GM/50ML-% IV SOLN
1.0000 g | Freq: Once | INTRAVENOUS | Status: AC
  Administered 2020-02-29: 1000 mg via INTRAVENOUS
  Filled 2020-02-29: qty 50

## 2020-02-29 MED ORDER — DOXERCALCIFEROL 4 MCG/2ML IV SOLN
2.0000 ug | INTRAVENOUS | Status: DC
  Administered 2020-03-05 – 2020-03-09 (×2): 2 ug via INTRAVENOUS
  Filled 2020-02-29 (×3): qty 2

## 2020-02-29 MED ORDER — MIDODRINE HCL 5 MG PO TABS
ORAL_TABLET | ORAL | Status: AC
  Filled 2020-02-29: qty 10

## 2020-02-29 MED ORDER — MONTELUKAST SODIUM 10 MG PO TABS
10.0000 mg | ORAL_TABLET | Freq: Every day | ORAL | Status: DC
  Administered 2020-03-01 – 2020-03-09 (×10): 10 mg via ORAL
  Filled 2020-02-29 (×10): qty 1

## 2020-02-29 MED ORDER — CALCIUM CARBONATE 1250 (500 CA) MG PO TABS
1250.0000 mg | ORAL_TABLET | Freq: Every day | ORAL | Status: DC
  Administered 2020-02-29 – 2020-03-09 (×10): 1250 mg via ORAL
  Filled 2020-02-29 (×11): qty 1

## 2020-02-29 MED ORDER — DOXERCALCIFEROL 4 MCG/2ML IV SOLN
INTRAVENOUS | Status: AC
  Administered 2020-02-29: 2 ug
  Filled 2020-02-29: qty 2

## 2020-02-29 MED ORDER — GABAPENTIN 100 MG PO CAPS
100.0000 mg | ORAL_CAPSULE | Freq: Three times a day (TID) | ORAL | Status: DC
  Administered 2020-02-29 – 2020-03-01 (×6): 100 mg via ORAL
  Filled 2020-02-29 (×6): qty 1

## 2020-02-29 MED ORDER — DIPHENHYDRAMINE HCL 50 MG/ML IJ SOLN
12.5000 mg | Freq: Once | INTRAMUSCULAR | Status: AC | PRN
  Administered 2020-02-29: 12.5 mg via INTRAVENOUS
  Filled 2020-02-29: qty 1

## 2020-02-29 MED ORDER — FENTANYL CITRATE (PF) 100 MCG/2ML IJ SOLN
100.0000 ug | Freq: Once | INTRAMUSCULAR | Status: AC
  Administered 2020-02-29: 100 ug via INTRAVENOUS
  Filled 2020-02-29: qty 2

## 2020-02-29 MED ORDER — FENTANYL CITRATE (PF) 100 MCG/2ML IJ SOLN
INTRAMUSCULAR | Status: AC
  Filled 2020-02-29: qty 2

## 2020-02-29 MED ORDER — INSULIN ASPART 100 UNIT/ML IV SOLN
5.0000 [IU] | Freq: Once | INTRAVENOUS | Status: AC
  Administered 2020-02-29: 5 [IU] via INTRAVENOUS
  Filled 2020-02-29: qty 0.05

## 2020-02-29 MED ORDER — HEPARIN (PORCINE) 25000 UT/250ML-% IV SOLN
1200.0000 [IU]/h | INTRAVENOUS | Status: DC
  Administered 2020-02-29: 1200 [IU]/h via INTRAVENOUS
  Filled 2020-02-29 (×2): qty 250

## 2020-02-29 MED ORDER — FENTANYL CITRATE (PF) 100 MCG/2ML IJ SOLN
12.5000 ug | Freq: Four times a day (QID) | INTRAMUSCULAR | Status: DC | PRN
  Administered 2020-02-29 (×2): 12.5 ug via INTRAVENOUS
  Filled 2020-02-29 (×2): qty 2

## 2020-02-29 MED ORDER — ALBUTEROL SULFATE (2.5 MG/3ML) 0.083% IN NEBU: 2.5000 mg | INHALATION_SOLUTION | RESPIRATORY_TRACT | Status: DC | PRN

## 2020-02-29 MED ORDER — MIDODRINE HCL 5 MG PO TABS
ORAL_TABLET | ORAL | Status: AC
  Filled 2020-02-29: qty 2

## 2020-02-29 MED ORDER — FENTANYL CITRATE (PF) 100 MCG/2ML IJ SOLN
25.0000 ug | Freq: Four times a day (QID) | INTRAMUSCULAR | Status: DC | PRN
  Administered 2020-02-29 – 2020-03-01 (×3): 25 ug via INTRAVENOUS
  Filled 2020-02-29 (×2): qty 2

## 2020-02-29 NOTE — Progress Notes (Signed)
[place] for Heparin Indication: hx PE (on apixaban), hx ESRD/dialysis, possible ortho surgery  Allergies  Allergen Reactions  . Enoxaparin Other (See Comments)    unknown  . 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 Other Other (See Comments)    Uncoded Allergy. Allergen: BLOOD THINNERS, Other Reaction: eye hemorrhage, seafood, (can eat salmon), acid drinks like orange juice,grape fruitt juice  . Ace Inhibitors Other (See Comments)    ESRD ESRD   . Aspirin Other (See Comments)    Eye hemorrhage  . Buprenorphine Hcl Other (See Comments)    unknown  . Codeine Itching  . Duloxetine Other (See Comments)    unknown  . Gentamicin Other (See Comments)    unknown  . Heparin     "depletes something in my system"  . Hydrocodone Itching  . Morphine And Related Other (See Comments)    unknown  . Oxycodone Itching  . Penicillins Other (See Comments)    unknown  . Vancomycin Other (See Comments)    unknown  . Cephalexin Rash  . Cephalosporins Rash  . Tomato Rash   Patient Measurements: Height: 5\' 11"  (180.3 cm) Weight: (!) 154.2 kg (340 lb) IBW/kg (Calculated) : 70.8 Heparin Dosing Weight: 108kg  Vital Signs: Temp: 97.7 F (36.5 C) (07/21 0435) Temp Source: Oral (07/21 0435) BP: 117/52 (07/21 0435) Pulse Rate: 63 (07/21 0435)  Labs: Recent Labs    02/28/20 2138 02/29/20 0210  HGB 9.8*  --   HCT 32.0*  --   PLT 148*  --   LABPROT  --  16.6*  INR  --  1.4*  CREATININE 9.26*  --    Estimated Creatinine Clearance: 9.3 mL/min (A) (by C-G formula based on SCr of 9.26 mg/dL (H)).  Medical History: Past Medical History:  Diagnosis Date  . Asthma   . Diabetes mellitus without complication ([place])   . Hypertension   . Legally blind   . Renal disorder    Medications:  Scheduled:  . calcium carbonate  1,250 mg Oral Q breakfast  . carvedilol  12.5  mg Oral BID WC  . gabapentin  100 mg Oral TID  . montelukast  10 mg Oral QHS  . sevelamer carbonate  800 mg Oral TID WC   Infusions:  . heparin 1,200 Units/hr (02/29/20 0518)    Assessment: 44 yoF with tibial fx, to [facility] ED > transfer to [facility], possible ortho surgery, Dialysis MWF. Pt on apixaban 5mg  po BID PTA for h/o PE - pt has been on this since Feb 2021 (prior to this, she was on coumadin). Pharmacy consulted for heparin dosing while apixaban on hold.  Apixaban likely affecting heparin levels so will utilize PTT monitoring until levels correlating.  Goal of Therapy:  Heparin level 0.3-0.7 units/ml; PTT 66-102 sec Monitor platelets by anticoagulation protocol: Yes   Plan:  Heparin infusion at 1200 units/hr, no bolus (as ordered at [facility]) Check 8 hr Heparin level and PTT Daily CBC, heparin level, and PTT   Sherlon Handing, PharmD, BCPS Please see amion for complete clinical pharmacist phone list 02/29/2020,5:43 AM

## 2020-02-29 NOTE — ED Notes (Signed)
[facility] called for transport to [facility]

## 2020-02-29 NOTE — Evaluation (Addendum)
Physical Therapy Evaluation Patient Details Name: Suzanne Levy MRN: [id number] DOB: 1951/04/12 Today's Date: 02/29/2020   History of Present Illness  Pt is a 69 y.o. F with significant PMH of ESRD on HD, DM2, PE, morbid obesity, asthma, hypertension, osteopneia, who presents to ED with right knee pain after striking it against a railing. Admitted with acute oblique slightly comminuted and slightly displaced fracture extending through the proximal tibial shaft. Plan for conservative treatment.  Clinical Impression  Prior to admission, pt lives alone in an apartment, has a caregiver 7 days/wk, 5 hours/day who assists with bed level ADL's and hoyer lift transfers to her power wheelchair. Pt presents with decreased functional mobility secondary to RLE pain, gross weakness, obesity, decreased ROM. Evaluation limited as pt unable to even perform rolling in bed without sharp increase in pain (RN notified). Placed bed in chair position to promote upright and pt able to perform ADL of brushing teeth with set up assist. RLE elevated at end of session. Education provided regarding IS use (pt achieving ~750) and requested MD order an air mattress bed for additional pressure relief. Recommending SNF at discharge.     Follow Up Recommendations SNF    Equipment Recommendations  None recommended by PT    Recommendations for Other Services       Precautions / Restrictions Precautions Precautions: Fall Precaution Comments: Legally blind Restrictions Weight Bearing Restrictions: Yes RLE Weight Bearing: Non weight bearing      Mobility  Bed Mobility Overal bed mobility: Needs Assistance Bed Mobility: Rolling Rolling: Max assist         General bed mobility comments: Pt requiring maxA for partial roll towards left with cues for use of UE's to reach for rail. Unable to completely go into sidelying position due to sharp increase in pain. Bed adjusted into chair position to allow for participation in  ADL's i.e. brushing teeth.   Transfers                 General transfer comment: unable to hoyer pt secondary to pain.   Ambulation/Gait                Stairs            Wheelchair Mobility    Modified Rankin (Stroke Patients Only)       Balance                                             Pertinent Vitals/Pain Pain Assessment: 0-10 Pain Score: 10-Worst pain ever Pain Location: RLE Pain Descriptors / Indicators: Aching;Stabbing Pain Intervention(s): Limited activity within patient's tolerance;Monitored during session;Patient requesting pain meds-RN notified;Repositioned    Home Living Family/patient expects to be discharged to:: Private residence Living Arrangements: Alone Available Help at Discharge: Available PRN/intermittently;Personal care attendant Type of Home: Apartment Home Access: Level entry     Home Layout: One level Home Equipment: Hospital bed;Wheelchair - power;Other (comment) (hoyer) Additional Comments: Bed level for toileting and bathing.     Prior Function Level of Independence: Needs assistance   Gait / Transfers Assistance Needed: Harrel Lemon for transfers. ModI with mobility in power chair was not amb.   ADL's / Homemaking Assistance Needed: bed level for ADLs. Pt. could assist wtih UE adls and dep for LE adls.   Comments: aide would hoyer her up for hd. Pt. would stay in bed on non  hd days.      Hand Dominance   Dominant Hand: Right    Extremity/Trunk Assessment   Upper Extremity Assessment Upper Extremity Assessment: Generalized weakness    Lower Extremity Assessment Lower Extremity Assessment: RLE deficits/detail;LLE deficits/detail RLE Deficits / Details: Tibia fracture, able to wiggle toes LLE Deficits / Details: Grossly 2/5       Communication   Communication: No difficulties  Cognition Arousal/Alertness: Awake/alert Behavior During Therapy: WFL for tasks assessed/performed Overall  Cognitive Status: Within Functional Limits for tasks assessed                                 General Comments: Pt initially lethargic but able to be aroused and interactive throughout rest of session      General Comments      Exercises     Assessment/Plan    PT Assessment Patient needs continued PT services  PT Problem List Decreased strength;Decreased range of motion;Decreased activity tolerance;Decreased balance;Decreased mobility;Obesity;Pain       PT Treatment Interventions DME instruction;Functional mobility training;Therapeutic activities;Therapeutic exercise;Balance training;Patient/family education    PT Goals (Current goals can be found in the Care Plan section)  Acute Rehab PT Goals Patient Stated Goal: to have less pain PT Goal Formulation: With patient Time For Goal Achievement: 03/14/20 Potential to Achieve Goals: Fair    Frequency Min 2X/week   Barriers to discharge        Co-evaluation PT/OT/SLP Co-Evaluation/Treatment: Yes Reason for Co-Treatment: Complexity of the patient's impairments (multi-system involvement);For patient/therapist safety;To address functional/ADL transfers PT goals addressed during session: Mobility/safety with mobility OT goals addressed during session: ADL's and self-care       AM-PAC PT "6 Clicks" Mobility  Outcome Measure Help needed turning from your back to your side while in a flat bed without using bedrails?: Total Help needed moving from lying on your back to sitting on the side of a flat bed without using bedrails?: Total Help needed moving to and from a bed to a chair (including a wheelchair)?: Total Help needed standing up from a chair using your arms (e.g., wheelchair or bedside chair)?: Total Help needed to walk in hospital room?: Total Help needed climbing 3-5 steps with a railing? : Total 6 Click Score: 6    End of Session   Activity Tolerance: Patient limited by pain Patient left: in bed;with  call bell/phone within reach;with bed alarm set Nurse Communication: Mobility status PT Visit Diagnosis: Pain;Other abnormalities of gait and mobility (R26.89) Pain - Right/Left: Right Pain - part of body: Leg    Time: 9147-8295 PT Time Calculation (min) (ACUTE ONLY): 38 min   Charges:   PT Evaluation $PT Eval Moderate Complexity: 1 Mod PT Treatments $Therapeutic Activity: 8-22 mins          Wyona Almas, PT, DPT Acute Rehabilitation Services Pager [phone] Office [phone]   Deno Etienne 02/29/2020, 10:39 AM

## 2020-02-29 NOTE — Progress Notes (Addendum)
Inpatient Diabetes Program Recommendations  AACE/ADA: New Consensus Statement on Inpatient Glycemic Control (2015)  Target Ranges:  Prepandial:   less than 140 mg/dL      Peak postprandial:   less than 180 mg/dL (1-2 hours)      Critically ill patients:  140 - 180 mg/dL   Lab Results  Component Value Date   GLUCAP 179 (H) 02/29/2020    Review of Glycemic Control Results for Suzanne Levy, Suzanne Levy (MRN [id number]) as of 02/29/2020 10:06  Ref. Range 02/28/2020 19:53 02/29/2020 06:40  Glucose-Capillary Latest Ref Range: 70 - 99 mg/dL 303 (H) 179 (H)   Diabetes history: type 2 DM Outpatient Diabetes medications: Novolog 14 units BID, Lantus 28 units BID Current orders for Inpatient glycemic control: Novolog 5 units x 1, none currently  Inpatient Diabetes Program Recommendations:    Consider adding: -Novolog 0-6 units TID -Lantus 10 units QD   Thanks, Bronson Curb, MSN, RNC-OB Diabetes Coordinator [phone] (8a-5p)

## 2020-02-29 NOTE — Progress Notes (Signed)
[place] for Heparin Indication: hx PE (on apixaban), hx ESRD/dialysis, possible ortho surgery  Allergies  Allergen Reactions  . Enoxaparin Other (See Comments)    unknown  . 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 Other Other (See Comments)    Uncoded Allergy. Allergen: BLOOD THINNERS, Other Reaction: eye hemorrhage, seafood, (can eat salmon), acid drinks like orange juice,grape fruitt juice  . Ace Inhibitors Other (See Comments)    ESRD   . Aspirin Other (See Comments)    Eye hemorrhage  . Buprenorphine Hcl Other (See Comments)    unknown  . Codeine Itching  . Duloxetine Other (See Comments)    unknown  . Gentamicin Other (See Comments)    unknown  . Heparin Other (See Comments)    "depletes something in my system"  . Hydrocodone Itching  . Morphine And Related Other (See Comments)    unknown  . Oxycodone Itching  . Penicillins Other (See Comments)    unknown  . Vancomycin Other (See Comments)    unknown  . Cephalexin Rash  . Cephalosporins Rash  . Tomato Rash   Patient Measurements: Height: 5\' 11"  (180.3 cm) Weight: (!) 165.6 kg (365 lb 1.3 oz) IBW/kg (Calculated) : 70.8 Heparin Dosing Weight: 108kg  Vital Signs: Temp: 98.3 F (36.8 C) (07/21 1916) Temp Source: Oral (07/21 1916) BP: 96/50 (07/21 2300) Pulse Rate: 70 (07/21 2300)  Labs: Recent Labs    02/28/20 2138 02/29/20 0210 02/29/20 0931 02/29/20 1219 02/29/20 2200  HGB 9.8*  --  8.8*  --   --   HCT 32.0*  --  29.8*  --   --   PLT 148*  --  129*  --   --   APTT  --   --   --  92* 87*  LABPROT  --  16.6* 18.4*  --   --   INR  --  1.4* 1.6*  --   --   HEPARINUNFRC  --   --   --  >2.20*  --   CREATININE 9.26*  --  10.14*  --   --    Estimated Creatinine Clearance: 8.9 mL/min (A) (by C-G formula based on SCr of 10.14 mg/dL (H)).  Infusions:  . heparin 1,200 Units/hr  (02/29/20 1513)    Assessment: 8 yoF with tibial fx, to [facility] ED > transfer to [name], possible ortho surgery, Dialysis MWF. Pt on apixaban 5mg  po BID PTA for h/o PE - pt has been on this since Feb 2021 (prior to this, she was on warfarin) - Last PTA dose 7/20 afternoon. Pharmacy consulted for heparin dosing while apixaban on hold.  PTT 87 sec (therapeutic) on gtt at 1200 units/hr. Apixaban still affecting heparin levels so will continue to utilize aPTT monitoring until levels correlate.  Goal of Therapy:  Heparin level 0.3-0.7 units/ml  aPTT 66-102 sec Monitor platelets by anticoagulation protocol: Yes   Plan:  Continue heparin infusion at 1200 units/hr Daily CBC, heparin level, and aPTT (until levels correlate)  Sherlon Handing, PharmD, BCPS Please see amion for complete clinical pharmacist phone list  02/29/2020   11:28 PM

## 2020-02-29 NOTE — Progress Notes (Addendum)
[place] for Heparin Indication: hx PE on Warfarin, hx ESRD/dialysis, possible ortho surgery  Allergies  Allergen Reactions  . Enoxaparin Other (See Comments)    unknown  . 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 Other Other (See Comments)    Uncoded Allergy. Allergen: BLOOD THINNERS, Other Reaction: eye hemorrhage, seafood, (can eat salmon), acid drinks like orange juice,grape fruitt juice  . Ace Inhibitors Other (See Comments)    ESRD ESRD   . Aspirin Other (See Comments)    Eye hemorrhage  . Buprenorphine Hcl Other (See Comments)    unknown  . Codeine Itching  . Duloxetine Other (See Comments)    unknown  . Gentamicin Other (See Comments)    unknown  . Heparin     "depletes something in my system"  . Hydrocodone Itching  . Morphine And Related Other (See Comments)    unknown  . Oxycodone Itching  . Penicillins Other (See Comments)    unknown  . Vancomycin Other (See Comments)    unknown  . Cephalexin Rash  . Cephalosporins Rash  . Tomato Rash   Patient Measurements: Height: 5\' 11"  (180.3 cm) Weight: (!) 154.2 kg (340 lb) IBW/kg (Calculated) : 70.8 Heparin Dosing Weight: 108kg  Vital Signs: Temp: 98.2 F (36.8 C) (07/20 1940) Temp Source: Oral (07/20 1940) BP: 163/66 (07/21 0230) Pulse Rate: 63 (07/21 0230)  Labs: Recent Labs    02/28/20 2138 02/29/20 0210  HGB 9.8*  --   HCT 32.0*  --   PLT 148*  --   LABPROT  --  16.6*  INR  --  1.4*  CREATININE 9.26*  --    Estimated Creatinine Clearance: 9.3 mL/min (A) (by C-G formula based on SCr of 9.26 mg/dL (H)).  Medical History: Past Medical History:  Diagnosis Date  . Asthma   . Diabetes mellitus without complication ([place])   . Hypertension   . Legally blind   . Renal disorder    Medications:  Scheduled:  . calcium carbonate  1,250 mg Oral Q breakfast   Infusions:     Assessment: 56 yoF with tibial fx, to [facility] ED > transfer to [facility], possible ortho surgery, Dialysis MWF PMH: ESRD, DM2, neuropathy, hx PE on Warfarin: 5mg  Tu,Th,Sat, 4mg  other days - last dose unknown  7/21: Baseline INR 1.4  Goal of Therapy:  Heparin level 0.3-0.7 units/ml Monitor platelets by anticoagulation protocol: Yes   Plan:  Begin Heparin infusion at 1200 units/hr, no bolus Check 8 hr Heparin level  Daily CBC, order daily Heparin level at steady state INR on 7/22  Minda Ditto PharmD 02/29/2020,2:52 AM

## 2020-02-29 NOTE — Plan of Care (Signed)
  Problem: Education: Goal: Knowledge of General Education information will improve Description: Including pain rating scale, medication(s)/side effects and non-pharmacologic comfort measures Outcome: Progressing   Problem: Clinical Measurements: Goal: Respiratory complications will improve Outcome: Progressing Note: On room air   Problem: Nutrition: Goal: Adequate nutrition will be maintained Outcome: Progressing   Problem: Coping: Goal: Level of anxiety will decrease Outcome: Progressing   Problem: Safety: Goal: Ability to remain free from injury will improve Outcome: Progressing
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
## 2020-02-29 NOTE — ED Notes (Signed)
[facility] arrives at bedside. Transferred to [facility] via stretcher. RLE splint intact. +PMS. Skin integrity maint. Belongings with pt.

## 2020-02-29 NOTE — ED Notes (Signed)
Attempted to call report to [facility]. Receiving RN unable to take report at this time and states she will call back.
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
## 2020-02-29 NOTE — Progress Notes (Signed)
No surgery planned. Needs foot elevated above heart. Plan bledsoe brace in 2 days if able to fit her leg, if not then she will need long leg cast. Diet ordered. Needs foot above heart to decrease bleeding into compartment from fractures. Full consult to follow  My cell [phone]
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
## 2020-02-29 NOTE — Progress Notes (Signed)
PROGRESS NOTE    Suzanne Levy  WUJ:[id number] DOB: Apr 10, 1951 DOA: 02/28/2020 PCP: System, Pcp Not In     Brief Narrative:  Suzanne Levy is a 69 y.o. 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  Patient was on her motorized wheelchair yesterday when she accidentally struck her right lower leg against the railing near the stairs outside of her apartment building.  She complained of sudden onset of severe right mid shin pain after the incident, with the pain extending to right ankle and right knee.  Pain was constant and worsens with movement.  Patient states that she went to dialysis today (though, dialysis days are MWF as indicated above) because she was told that more fluid needed to be taken off her.  She denies fever, chills, chest pain, shortness of breath, headache.  ED Course:  In the emergency department, she was hemodynamically stable.  Work-up in the ED showed macrocytic anemia, hypokalemia, BUN/creatinine 65/9.26 (no recent labs for comparison with last lab being 5 years ago at 9.66).  Right tibia and fibula x-ray showed acute obliquely slightly comminuted and slightly displaced fracture extending through the proximal tibial shaft.  Additional possible subtle cortical irregularity at the right fibular head/neck, which could reflect an additional subtle nondisplaced fracture was noted.  IV fentanyl was given in the ED due to pain.  Splint was placed on affected area of the leg.  Orthopedic surgeon (Dr. [name]) was consulted and will see patient in the morning once admitted by hospitalist team.  Hospitalist was asked to admit patient for further evaluation  management.  Subjective: Afebrile overnight A/O x4, negative N/V, positive RIGHT lower extremity pain secondary to splint digging into her RIGHT upper thigh secondary to her body habitus    Assessment & Plan: Covid vaccination; positive Covid vaccination   Principal Problem:   Right tibial fracture Active Problems:   Hyperkalemia   Obesity, Class III, BMI 40-49.9 (morbid obesity) (HCC)   Hyperglycemia due to diabetes mellitus (HCC)   Osteopenia   Macrocytic anemia   Abrasion of right knee   Asthma   ESRD (end stage renal disease) ([place])   Pulmonary embolism (HCC)   Fibula fracture   RIGHT Tibial fracture/Possible nondisplaced right fibular head/neck fracture -Per orthopedic surgery note 7/21 NO surgery contemplated -Pain currently uncontrolled, will slowly titrate up pain medication.  Given patient has ESRD will need to be mindful of buildup of narcotic medication. -7/21 PT OT eval; permanent placement to long-term care facility, since surgery is not planning on fixing fracture in this morbidly obese, legally blind patient.  Right knee abrasion -Minimal laceration  ESRD on HD M/W/F -BUN/creatinine 65/9.26 (no recent labs for comparison with last lab being 5 years ago at 9.66).  -7/21 spoke with Dr. Royce Macadamia nephrology patient will be dialyzed today   Hyperkalemia  -K+ 6.2, no peaked T waves noted on ECG -Calcium gluconate and Lokelma given in the ED -Continue telemetry -7/21 potassium now down to 5.0, see ESRD  Macrocytic anemia? -H/H 9.8/32 MCV 105.3 -Anemia panel pending  Osteopenia? -Vitamin D, 25-hydroxy WNL  -Calcium was normal at 9.3  Hx pulmonary embolism -Patient has history of PE  -Continue heparin drip.  Will discuss what agent  to restart patient on as do not believe  ESRD patients can be on DOAC, and patient was on Eliquis.     Asthma -  Obesity, class III (BMI 47.42) -This was determined by height 5\' 11"  and weight 154.2 kg -Per EMR patient  was counseled about diet and exercise when more stable.  However given that patient will be mostly bedbound this is unrealistic  Obstructive sleep apnea not on CPAP Stable    DVT prophylaxis: Heparin drip Code Status: Full Family Communication:  Status is: Inpatient    Dispo: The patient is from: Home              Anticipated d/c is to: Long-term care facility              Anticipated d/c date is: 8/1              Patient currently unstable      Consultants:  Orthopedic surgery  Procedures/Significant Events:    I have personally reviewed and interpreted all radiology studies and my findings are as above.  VENTILATOR SETTINGS:    Cultures   Antimicrobials:    Devices    LINES / TUBES:      Continuous Infusions: . heparin 1,200 Units/hr (02/29/20 0518)     Objective: Vitals:   02/29/20 0100 02/29/20 0230 02/29/20 0435 02/29/20 0742  BP: (!) 180/99 (!) 163/66 (!) 117/52 (!) 107/46  Pulse: 68 63 63 65  Resp: 13 12 16 17   Temp:   97.7 F (36.5 C) (!) 97.5 F (36.4 C)  TempSrc:   Oral Oral  SpO2: 99% 98% 98% 100%  Weight:      Height:        Intake/Output Summary (Last 24 hours) at 02/29/2020 0908 Last data filed at 02/29/2020 0127 Gross per 24 hour  Intake 150 ml  Output --  Net 150 ml   Filed Weights   02/29/20 0012  Weight: (!) 154.2 kg    Examination:  General: A/O x4, No acute respiratory distress Eyes: negative scleral hemorrhage, negative anisocoria, negative icterus.  Legally blind ENT: Negative Runny nose, negative gingival bleeding, Neck:  Negative scars, masses, torticollis, lymphadenopathy, JVD Lungs: Clear to auscultation bilaterally without wheezes or crackles Cardiovascular: Regular rate and rhythm without murmur gallop or rub normal S1 and S2 Abdomen: MORBIDLY OBESE, negative abdominal pain, nondistended, positive soft, bowel sounds, no rebound, no ascites, no appreciable mass Extremities: RIGHT lower extremity in  splint which is digging into the posterior aspect of her RIGHT thigh Skin: Negative rashes, lesions, ulcers Psychiatric:  Negative depression, negative anxiety, negative fatigue, negative mania  Central nervous system:  Cranial nerves II through XII intact, tongue/uvula midline, all extremities muscle strength 5/5, sensation intact throughout,  negative dysarthria, negative expressive aphasia, negative receptive aphasia.  .     Data Reviewed: Care during the described time interval was provided by me .  I have reviewed this patient's available data, including medical history, events of note, physical examination, and all test results as part of my evaluation.  CBC: Recent Labs  Lab 02/28/20 2138  WBC 10.0  NEUTROABS 8.4*  HGB 9.8*  HCT 32.0*  MCV 105.3*  PLT 923*   Basic Metabolic Panel: Recent Labs  Lab 02/28/20 2138 02/29/20 0118  NA 135  --   K 6.2*  --   CL 91*  --   CO2 24  --   GLUCOSE 262* 220*  BUN 65*  --   CREATININE 9.26*  --  CALCIUM 9.3  --    GFR: Estimated Creatinine Clearance: 9.3 mL/min (A) (by C-G formula based on SCr of 9.26 mg/dL (H)). Liver Function Tests: No results for input(s): AST, ALT, ALKPHOS, BILITOT, PROT, ALBUMIN in the last 168 hours. No results for input(s): LIPASE, AMYLASE in the last 168 hours. No results for input(s): AMMONIA in the last 168 hours. Coagulation Profile: Recent Labs  Lab 02/29/20 0210  INR 1.4*   Cardiac Enzymes: No results for input(s): CKTOTAL, CKMB, CKMBINDEX, TROPONINI in the last 168 hours. BNP (last 3 results) No results for input(s): PROBNP in the last 8760 hours. HbA1C: No results for input(s): HGBA1C in the last 72 hours. CBG: Recent Labs  Lab 02/28/20 1953 02/29/20 0640  GLUCAP 303* 179*   Lipid Profile: No results for input(s): CHOL, HDL, LDLCALC, TRIG, CHOLHDL, LDLDIRECT in the last 72 hours. Thyroid Function Tests: No results for input(s): TSH, T4TOTAL, FREET4, T3FREE, THYROIDAB in the last  72 hours. Anemia Panel: Recent Labs    02/29/20 0141  VITAMINB12 1,497*  FOLATE 90.1   Sepsis Labs: No results for input(s): PROCALCITON, LATICACIDVEN in the last 168 hours.  Recent Results (from the past 240 hour(s))  SARS Coronavirus 2 by RT PCR (hospital order, performed in [facility] hospital lab) Nasopharyngeal Nasopharyngeal Swab     Status: None   Collection Time: 02/28/20 11:23 PM   Specimen: Nasopharyngeal Swab  Result Value Ref Range Status   SARS Coronavirus 2 NEGATIVE NEGATIVE Final    Comment: (NOTE) SARS-CoV-2 target nucleic acids are NOT DETECTED.  The SARS-CoV-2 RNA is generally detectable in upper and lower respiratory specimens during the acute phase of infection. The lowest concentration of SARS-CoV-2 viral copies this assay can detect is 250 copies / mL. A negative result does not preclude SARS-CoV-2 infection and should not be used as the sole basis for treatment or other patient management decisions.  A negative result may occur with improper specimen collection / handling, submission of specimen other than nasopharyngeal swab, presence of viral mutation(s) within the areas targeted by this assay, and inadequate number of viral copies (<250 copies / mL). A negative result must be combined with clinical observations, patient history, and epidemiological information.  Fact Sheet for Patients:   StrictlyIdeas.no  Fact Sheet for Healthcare Providers: BankingDealers.co.za  This test is not yet approved or  cleared by the [place] FDA and has been authorized for detection and/or diagnosis of SARS-CoV-2 by FDA under an Emergency Use Authorization (EUA).  This EUA will remain in effect (meaning this test can be used) for the duration of the COVID-19 declaration under Section 564(b)(1) of the Act, 21 U.S.C. section 360bbb-3(b)(1), unless the authorization is terminated or revoked sooner.  Performed at  [facility], [place] [address], [place], [place] [address]          Radiology Studies: DG Tibia/Fibula Right  Result Date: 02/28/2020 CLINICAL DATA:  Initial evaluation for acute pain status post injury. EXAM: RIGHT TIBIA AND FIBULA - 2 VIEW COMPARISON:  None. FINDINGS: Examination technically limited by positioning and underlying severe osteopenia. Single fixation screw traverses the proximal tibial shaft. Just inferiorly, there is an acute oblique slightly comminuted and slightly displaced fracture extending through the proximal tibial shaft. Additional possible subtle cortical irregularity at the right fibular head/neck, which could reflect an additional subtle nondisplaced fracture. No other definite acute osseous abnormality. Advanced osteoarthritic changes noted about the knee and ankle. No visible soft tissue injury. Extensive vascular calcifications noted. IMPRESSION:  1. Acute oblique slightly comminuted and slightly displaced fracture extending through the proximal tibial shaft. 2. Additional possible subtle cortical irregularity at the right fibular head/neck, which could reflect an additional subtle nondisplaced fracture. Correlation with physical exam recommended. 3. Underlying severe osteopenia. Electronically Signed   By: Jeannine Boga M.D.   On: 02/28/2020 21:22   DG Ankle Complete Right  Result Date: 02/28/2020 CLINICAL DATA:  Initial evaluation for acute pain status post injury. EXAM: RIGHT ANKLE - COMPLETE 3+ VIEW COMPARISON:  None. FINDINGS: Examination is technically limited by patient positioning. Additionally, severe osteopenia limits evaluation for possible subtle acute nondisplaced fractures. No definite acute fracture or dislocation. Ankle mortise approximated. No definite abnormality about the visualized foot. No visible soft tissue injury. Extensive vascular calcifications noted. IMPRESSION: Technically limited study due to patient positioning and  severe osteopenia. No definite acute osseous abnormality about the right ankle. Electronically Signed   By: Jeannine Boga M.D.   On: 02/28/2020 21:11   DG Knee Complete 4 Views Right  Result Date: 02/28/2020 CLINICAL DATA:  Initial evaluation for acute pain status post injury. EXAM: RIGHT KNEE - COMPLETE 4+ VIEW COMPARISON:  None. FINDINGS: Examination technically limited by patient positioning and severe osteopenia. Single fixation screw traverses the proximal tibia. There is an acute oblique slightly comminuted and displaced fracture extending through the proximal right tibial shaft. Question additional subtle cortical irregularity at the right fibular head/neck, which could reflect an additional nondisplaced fracture. No other definite acute osseous abnormality. Visualized distal femur intact. Patella not well visualized. Severe osteoarthritic changes present about the knee. No obvious or visible joint effusion. Extensive vascular calcifications seen throughout the visualized soft tissues. IMPRESSION: 1. Acute oblique slightly comminuted and displaced fracture extending through the proximal right tibial shaft. 2. Question additional subtle cortical irregularity at the right fibular head/neck, which could reflect an additional nondisplaced fracture. Correlation with physical exam recommended. 3. Underlying severe osteopenia with advanced osteoarthritic changes about the right knee. Electronically Signed   By: Jeannine Boga M.D.   On: 02/28/2020 21:19        Scheduled Meds: . calcium carbonate  1,250 mg Oral Q breakfast  . carvedilol  12.5 mg Oral BID WC  . gabapentin  100 mg Oral TID  . montelukast  10 mg Oral QHS  . sevelamer carbonate  800 mg Oral TID WC   Continuous Infusions: . heparin 1,200 Units/hr (02/29/20 0518)     LOS: 1 day    Time spent:40 min    Neddie Steedman, Geraldo Docker, MD [facility] Pager [phone]  If 7PM-7AM, please contact  night-coverage [email or web address] Password [email or web address] 02/29/2020, 9:08 AM

## 2020-02-29 NOTE — Progress Notes (Addendum)
[place] for Heparin Indication: hx PE (on apixaban), hx ESRD/dialysis, possible ortho surgery  Allergies  Allergen Reactions  . Enoxaparin Other (See Comments)    unknown  . 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 Other Other (See Comments)    Uncoded Allergy. Allergen: BLOOD THINNERS, Other Reaction: eye hemorrhage, seafood, (can eat salmon), acid drinks like orange juice,grape fruitt juice  . Ace Inhibitors Other (See Comments)    ESRD ESRD   . Aspirin Other (See Comments)    Eye hemorrhage  . Buprenorphine Hcl Other (See Comments)    unknown  . Codeine Itching  . Duloxetine Other (See Comments)    unknown  . Gentamicin Other (See Comments)    unknown  . Heparin     "depletes something in my system"  . Hydrocodone Itching  . Morphine And Related Other (See Comments)    unknown  . Oxycodone Itching  . Penicillins Other (See Comments)    unknown  . Vancomycin Other (See Comments)    unknown  . Cephalexin Rash  . Cephalosporins Rash  . Tomato Rash   Patient Measurements: Height: 5\' 11"  (180.3 cm) Weight: (!) 154.2 kg (340 lb) IBW/kg (Calculated) : 70.8 Heparin Dosing Weight: 108kg  Vital Signs: Temp: 98 F (36.7 C) (07/21 1315) Temp Source: Oral (07/21 1315) BP: 96/43 (07/21 1315) Pulse Rate: 69 (07/21 1315)  Labs: Recent Labs    02/28/20 2138 02/29/20 0210 02/29/20 0931 02/29/20 1219  HGB 9.8*  --  8.8*  --   HCT 32.0*  --  29.8*  --   PLT 148*  --  129*  --   APTT  --   --   --  92*  LABPROT  --  16.6* 18.4*  --   INR  --  1.4* 1.6*  --   HEPARINUNFRC  --   --   --  >2.20*  CREATININE 9.26*  --  10.14*  --    Estimated Creatinine Clearance: 8.5 mL/min (A) (by C-G formula based on SCr of 10.14 mg/dL (H)).  Medical History: Past Medical History:  Diagnosis Date  . Asthma   . Diabetes mellitus without complication ([place])    . Hypertension   . Legally blind   . Renal disorder    Medications:  Scheduled:  . calcium carbonate  1,250 mg Oral Q breakfast  . carvedilol  12.5 mg Oral BID WC  . Chlorhexidine Gluconate Cloth  6 each Topical Q0600  . gabapentin  100 mg Oral TID  . montelukast  10 mg Oral QHS  . sevelamer carbonate  800 mg Oral TID WC   Infusions:  . heparin 1,200 Units/hr (02/29/20 0518)    Assessment: 75 yoF with tibial fx, to [facility] ED > transfer to [facility], possible ortho surgery, Dialysis MWF. Pt on apixaban 5mg  po BID PTA for h/o PE - pt has been on this since Feb 2021 (prior to this, she was on warfarin) - Last PTA dose 7/20 afternoon. Pharmacy consulted for heparin dosing while apixaban on hold.  Today, patient's Hgb and platelets trending down from previous, but no bleeding reported. No issues with the infusion per RN. Apixaban still affecting heparin levels as aPTT within therapeutic range at 92 but heparin level appears falsely elevated. Will continue to utilize aPTT monitoring until levels correlate.  Goal of Therapy:  Heparin level 0.3-0.7 units/ml  aPTT  66-102 sec Monitor platelets by anticoagulation protocol: Yes   Plan:  Continue heparin infusion at 1200 units/hr 8h aPTT Daily CBC, heparin level, and aPTT (until levels correlate) Continue to monitor for s/sx bleeding F/u transition back to oral anticoagulant    Brendolyn Patty, PharmD Clinical Pharmacist  02/29/2020   1:32 PM   Please check AMION for all [place] phone numbers After 10:00 PM, call the [place] [phone]

## 2020-02-29 NOTE — ED Notes (Signed)
Attempted to call report to [facility]. Staff requesting RN to call back in 10-15 minutes.

## 2020-03-01 ENCOUNTER — Encounter (HOSPITAL_COMMUNITY): Payer: Self-pay | Admitting: Internal Medicine

## 2020-03-01 DIAGNOSIS — S82231B Displaced oblique fracture of shaft of right tibia, initial encounter for open fracture type I or II: Secondary | ICD-10-CM | POA: Diagnosis not present

## 2020-03-01 DIAGNOSIS — S80211D Abrasion, right knee, subsequent encounter: Secondary | ICD-10-CM | POA: Diagnosis not present

## 2020-03-01 DIAGNOSIS — S82401A Unspecified fracture of shaft of right fibula, initial encounter for closed fracture: Secondary | ICD-10-CM | POA: Diagnosis not present

## 2020-03-01 DIAGNOSIS — J4520 Mild intermittent asthma, uncomplicated: Secondary | ICD-10-CM | POA: Diagnosis not present

## 2020-03-01 LAB — HEMOGLOBIN A1C
Hgb A1c MFr Bld: 7.1 % — ABNORMAL HIGH (ref 4.8–5.6)
Mean Plasma Glucose: 157.07 mg/dL

## 2020-03-01 LAB — CBC WITH DIFFERENTIAL/PLATELET
Abs Immature Granulocytes: 0.07 10*3/uL (ref 0.00–0.07)
Basophils Absolute: 0.1 10*3/uL (ref 0.0–0.1)
Basophils Relative: 1 %
Eosinophils Absolute: 0.2 10*3/uL (ref 0.0–0.5)
Eosinophils Relative: 2 %
HCT: 28 % — ABNORMAL LOW (ref 36.0–46.0)
Hemoglobin: 8.4 g/dL — ABNORMAL LOW (ref 12.0–15.0)
Immature Granulocytes: 1 %
Lymphocytes Relative: 9 %
Lymphs Abs: 0.7 10*3/uL (ref 0.7–4.0)
MCH: 31.2 pg (ref 26.0–34.0)
MCHC: 30 g/dL (ref 30.0–36.0)
MCV: 104.1 fL — ABNORMAL HIGH (ref 80.0–100.0)
Monocytes Absolute: 0.7 10*3/uL (ref 0.1–1.0)
Monocytes Relative: 9 %
Neutro Abs: 6.1 10*3/uL (ref 1.7–7.7)
Neutrophils Relative %: 78 %
Platelets: 120 10*3/uL — ABNORMAL LOW (ref 150–400)
RBC: 2.69 MIL/uL — ABNORMAL LOW (ref 3.87–5.11)
RDW: 17.2 % — ABNORMAL HIGH (ref 11.5–15.5)
WBC: 7.7 10*3/uL (ref 4.0–10.5)
nRBC: 0.4 % — ABNORMAL HIGH (ref 0.0–0.2)

## 2020-03-01 LAB — COMPREHENSIVE METABOLIC PANEL
ALT: 14 U/L (ref 0–44)
AST: 17 U/L (ref 15–41)
Albumin: 2.8 g/dL — ABNORMAL LOW (ref 3.5–5.0)
Alkaline Phosphatase: 90 U/L (ref 38–126)
Anion gap: 18 — ABNORMAL HIGH (ref 5–15)
BUN: 35 mg/dL — ABNORMAL HIGH (ref 8–23)
CO2: 21 mmol/L — ABNORMAL LOW (ref 22–32)
Calcium: 8.5 mg/dL — ABNORMAL LOW (ref 8.9–10.3)
Chloride: 94 mmol/L — ABNORMAL LOW (ref 98–111)
Creatinine, Ser: 6.24 mg/dL — ABNORMAL HIGH (ref 0.44–1.00)
GFR calc Af Amer: 7 mL/min — ABNORMAL LOW (ref >60)
GFR calc non Af Amer: 6 mL/min — ABNORMAL LOW (ref >60)
Glucose, Bld: 240 mg/dL — ABNORMAL HIGH (ref 70–99)
Potassium: 4.3 mmol/L (ref 3.5–5.1)
Sodium: 133 mmol/L — ABNORMAL LOW (ref 135–145)
Total Bilirubin: 0.8 mg/dL (ref 0.3–1.2)
Total Protein: 7.1 g/dL (ref 6.5–8.1)

## 2020-03-01 LAB — IRON AND TIBC
Iron: 59 ug/dL (ref 28–170)
Saturation Ratios: 22 % (ref 10.4–31.8)
TIBC: 270 ug/dL (ref 250–450)
UIBC: 211 ug/dL

## 2020-03-01 LAB — RETICULOCYTES
Immature Retic Fract: 39 % — ABNORMAL HIGH (ref 2.3–15.9)
RBC.: 2.71 MIL/uL — ABNORMAL LOW (ref 3.87–5.11)
Retic Count, Absolute: 81.3 10*3/uL (ref 19.0–186.0)
Retic Ct Pct: 3 % (ref 0.4–3.1)

## 2020-03-01 LAB — GLUCOSE, CAPILLARY
Glucose-Capillary: 230 mg/dL — ABNORMAL HIGH (ref 70–99)
Glucose-Capillary: 259 mg/dL — ABNORMAL HIGH (ref 70–99)
Glucose-Capillary: 284 mg/dL — ABNORMAL HIGH (ref 70–99)
Glucose-Capillary: 223 mg/dL — ABNORMAL HIGH (ref 70–99)

## 2020-03-01 LAB — FERRITIN: Ferritin: >7500 ng/mL — ABNORMAL HIGH (ref 11–307)

## 2020-03-01 LAB — APTT: aPTT: 89 seconds — ABNORMAL HIGH (ref 24–36)

## 2020-03-01 LAB — PHOSPHORUS: Phosphorus: 5.4 mg/dL — ABNORMAL HIGH (ref 2.5–4.6)

## 2020-03-01 LAB — HEPARIN LEVEL (UNFRACTIONATED): Heparin Unfractionated: 1.44 IU/mL — ABNORMAL HIGH (ref 0.30–0.70)

## 2020-03-01 LAB — VITAMIN B12: Vitamin B-12: 1322 pg/mL — ABNORMAL HIGH (ref 180–914)

## 2020-03-01 LAB — MAGNESIUM: Magnesium: 2 mg/dL (ref 1.7–2.4)

## 2020-03-01 LAB — FOLATE: Folate: 29.8 ng/mL (ref >5.9)

## 2020-03-01 MED ORDER — INSULIN ASPART 100 UNIT/ML ~~LOC~~ SOLN
0.0000 [IU] | Freq: Three times a day (TID) | SUBCUTANEOUS | Status: DC
  Administered 2020-03-01 (×2): 8 [IU] via SUBCUTANEOUS
  Administered 2020-03-02: 3 [IU] via SUBCUTANEOUS
  Administered 2020-03-03: 2 [IU] via SUBCUTANEOUS
  Administered 2020-03-04 (×2): 5 [IU] via SUBCUTANEOUS
  Administered 2020-03-05 – 2020-03-08 (×5): 3 [IU] via SUBCUTANEOUS
  Administered 2020-03-08: 8 [IU] via SUBCUTANEOUS
  Administered 2020-03-09: 3 [IU] via SUBCUTANEOUS

## 2020-03-01 MED ORDER — APIXABAN 5 MG PO TABS
5.0000 mg | ORAL_TABLET | Freq: Two times a day (BID) | ORAL | Status: DC
  Administered 2020-03-01 – 2020-03-09 (×18): 5 mg via ORAL
  Filled 2020-03-01 (×18): qty 1

## 2020-03-01 MED ORDER — INSULIN NPH (HUMAN) (ISOPHANE) 100 UNIT/ML ~~LOC~~ SUSP
20.0000 [IU] | Freq: Every day | SUBCUTANEOUS | Status: DC
  Administered 2020-03-01 – 2020-03-09 (×7): 20 [IU] via SUBCUTANEOUS
  Filled 2020-03-01 (×2): qty 10

## 2020-03-01 MED ORDER — INSULIN NPH (HUMAN) (ISOPHANE) 100 UNIT/ML ~~LOC~~ SUSP
36.0000 [IU] | Freq: Every day | SUBCUTANEOUS | Status: DC
  Administered 2020-03-01 – 2020-03-09 (×9): 36 [IU] via SUBCUTANEOUS
  Filled 2020-03-01: qty 10

## 2020-03-01 MED ORDER — DIPHENHYDRAMINE HCL 50 MG/ML IJ SOLN
25.0000 mg | Freq: Once | INTRAMUSCULAR | Status: AC
  Administered 2020-03-01: 25 mg via INTRAVENOUS
  Filled 2020-03-01: qty 1

## 2020-03-01 MED ORDER — INSULIN ASPART 100 UNIT/ML ~~LOC~~ SOLN
0.0000 [IU] | Freq: Every day | SUBCUTANEOUS | Status: DC
  Administered 2020-03-01 – 2020-03-08 (×4): 2 [IU] via SUBCUTANEOUS

## 2020-03-01 MED ORDER — FENTANYL CITRATE (PF) 100 MCG/2ML IJ SOLN
37.5000 ug | INTRAMUSCULAR | Status: DC | PRN
  Administered 2020-03-01 – 2020-03-05 (×10): 37.5 ug via INTRAVENOUS
  Filled 2020-03-01 (×12): qty 2

## 2020-03-01 NOTE — NC FL2 (Signed)
[place] MEDICAID FL2 LEVEL OF CARE SCREENING TOOL     IDENTIFICATION  Patient Name: Suzanne Levy Birthdate: 1950-09-22 Sex: female Admission Date (Current Location): 02/28/2020  [facility] and [place] Number:  Herbalist and Address:  [facility], [place] [address] [place], [place] [address]      Provider Number: [id number]  Attending Physician Name and Address:  Allie Bossier, MD  Relative Name and Phone Number:  Lucita Lora - [phone]    Current Level of Care: Hospital Recommended Level of Care: [place] Prior Approval Number:    Date Approved/Denied:   PASRR Number: [id number]  Discharge Plan: SNF    Current Diagnoses: Patient Active Problem List   Diagnosis Date Noted  . Hyperkalemia 02/29/2020  . Obesity, Class III, BMI 40-49.9 (morbid obesity) ([place]) 02/29/2020  . Hyperglycemia due to diabetes mellitus ([place]) 02/29/2020  . Osteopenia 02/29/2020  . Macrocytic anemia 02/29/2020  . Abrasion of right knee 02/29/2020  . Asthma 02/29/2020  . ESRD (end stage renal disease) ([place]) 02/29/2020  . Pulmonary embolism ([place]) 02/29/2020  . Fibula fracture 02/29/2020  . Right tibial fracture 02/28/2020    Orientation RESPIRATION BLADDER Height & Weight     Self, Time, Situation, Place  O2, Other (Comment) (No use of CPAP, current Home O2 at baseline) Continent ([place] - HD patient MWF at [place]) Weight: (!) 165.6 kg Height:  5\' 11"  (180.3 cm)  BEHAVIORAL SYMPTOMS/MOOD NEUROLOGICAL BOWEL NUTRITION STATUS      Continent Diet (See Discharge Summary)  AMBULATORY STATUS COMMUNICATION OF NEEDS Skin   Total Care   Skin abrasions, Other (Comment) (Ace wrap/splint to Right lower lef, abrasions to L leg, abdomen, breast, thigh)                       Personal Care Assistance Level of Assistance  Bathing, Dressing Bathing Assistance: Maximum assistance   Dressing Assistance: Maximum assistance     Functional  Limitations Info  Sight, Hearing Sight Info: Impaired (Legally blind) Hearing Info: Adequate Speech Info: Adequate    SPECIAL CARE FACTORS FREQUENCY  PT (By licensed PT), OT (By licensed OT) (HD patient at [facility] - MWF)     PT Frequency: 5 x per week OT Frequency: 5 x per week            Contractures Contractures Info: Not present    Additional Factors Info  Code Status, Allergies, Insulin Sliding Scale Code Status Info: Full code Allergies Info: Enoxaparin, Lovenox, blood thinners, Ace Inhibitors, Aspirin, Buprenorphine, codeine, duloxetine, Gentamycin, Heparin, Hydrocodone, morphine, oxycodone, penicillin,  vancomycin, cephalexin, cephalosporin, tomato   Insulin Sliding Scale Info: See Discharge summary       Current Medications (03/01/2020):  This is the current hospital active medication list Current Facility-Administered Medications  Medication Dose Route Frequency Provider Last Rate Last Admin  . acetaminophen (TYLENOL) 325 MG tablet           . acetaminophen (TYLENOL) tablet 650 mg  650 mg Oral Q6H PRN Adefeso, Oladapo, DO   650 mg at 02/29/20 2142   Or  . acetaminophen (TYLENOL) suppository 650 mg  650 mg Rectal Q6H PRN Adefeso, Oladapo, DO      . albuterol (PROVENTIL) (2.5 MG/3ML) 0.083% nebulizer solution 2.5 mg  2.5 mg Inhalation Q4H PRN Adefeso, Oladapo, DO      . calcium carbonate (OS-CAL - dosed in mg of elemental calcium) tablet 1,250 mg  1,250  mg Oral Q breakfast Adefeso, Oladapo, DO   1,250 mg at 03/01/20 9169  . carvedilol (COREG) tablet 12.5 mg  12.5 mg Oral BID WC Adefeso, Oladapo, DO      . Chlorhexidine Gluconate Cloth 2 % PADS 6 each  6 each Topical Q0600 Claudia Desanctis, MD   6 each at 03/01/20 [id number]  . [START ON 03/02/2020] doxercalciferol (HECTOROL) injection 2 mcg  2 mcg Intravenous Q M,W,F-HD Claudia Desanctis, MD      . fentaNYL (SUBLIMAZE) injection 25 mcg  25 mcg Intravenous Q6H PRN Allie Bossier, MD   25 mcg at 03/01/20 8882  . fluticasone  (FLONASE) 50 MCG/ACT nasal spray 1 spray  1 spray Each Nare Daily PRN Adefeso, Oladapo, DO      . gabapentin (NEURONTIN) capsule 100 mg  100 mg Oral TID Adefeso, Oladapo, DO   100 mg at 03/01/20 0832  . heparin ADULT infusion 100 units/mL (25000 units/275mL sodium chloride 0.45%)  1,200 Units/hr Intravenous Continuous Minda Ditto, RPH 12 mL/hr at 02/29/20 1513 1,200 Units/hr at 02/29/20 1513  . midodrine (PROAMATINE) 5 MG tablet           . midodrine (PROAMATINE) tablet 10 mg  10 mg Oral BID PRN Elmarie Shiley, MD   10 mg at 02/29/20 2143  . montelukast (SINGULAIR) tablet 10 mg  10 mg Oral QHS Adefeso, Oladapo, DO   10 mg at 03/01/20 0111  . prochlorperazine (COMPAZINE) injection 5 mg  5 mg Intravenous Q6H PRN Adefeso, Oladapo, DO   5 mg at 02/29/20 0141  . sevelamer carbonate (RENVELA) tablet 800 mg  800 mg Oral TID WC Adefeso, Oladapo, DO   800 mg at 03/01/20 8003     Discharge Medications: Please see discharge summary for a list of discharge medications.  Relevant Imaging Results:  Relevant Lab Results:   Additional Information SS# [id number]  Curlene Labrum, RN

## 2020-03-01 NOTE — Progress Notes (Signed)
Occupational Therapy Treatment Patient Details Name: Suzanne Levy MRN: [id number] DOB: October 09, 1950 Today's Date: 03/01/2020    History of present illness Pt is a 69 y.o. F with significant PMH of ESRD on HD, DM2, PE, morbid obesity, asthma, hypertension, osteopneia, who presents to ED with right knee pain after striking it against a railing. Admitted with acute oblique slightly comminuted and slightly displaced fracture extending through the proximal tibial shaft. Plan for conservative treatment.   OT comments  Pt agreed to ADL activity in bed with HOB raised.  Vision challenging with self feeding  Follow Up Recommendations  SNF    Equipment Recommendations  None recommended by OT    Recommendations for Other Services      Precautions / Restrictions Precautions Precautions: Fall;Other (comment) Precaution Comments: Legally blind              ADL either performed or assessed with clinical judgement   ADL Overall ADL's : Needs assistance/impaired Eating/Feeding: Minimal assistance;Bed level (HOB raised) Eating/Feeding Details (indicate cue type and reason): legally blind Grooming: Oral care;Wash/dry face;Wash/dry hands;Minimal assistance;Bed level Grooming Details (indicate cue type and reason): HOB raised                               General ADL Comments: Focused on ADL activity from bed level.  R UE with limitations.     Vision Baseline Vision/History: Legally blind Patient Visual Report: No change from baseline            Cognition Arousal/Alertness: Awake/alert Behavior During Therapy: WFL for tasks assessed/performed Overall Cognitive Status: Within Functional Limits for tasks assessed                                                     Pertinent Vitals/ Pain       Pain Score: 8  Pain Location: RLE Pain Descriptors / Indicators: Aching;Stabbing         Frequency  Min 2X/week        Progress Toward Goals  OT  Goals(current goals can now be found in the care plan section)  Progress towards OT goals: Progressing toward goals;OT to reassess next treatment     Plan Discharge plan remains appropriate       AM-PAC OT "6 Clicks" Daily Activity     Outcome Measure   Help from another person eating meals?: A Little Help from another person taking care of personal grooming?: A Lot Help from another person toileting, which includes using toliet, bedpan, or urinal?: Total Help from another person bathing (including washing, rinsing, drying)?: Total Help from another person to put on and taking off regular upper body clothing?: Total Help from another person to put on and taking off regular lower body clothing?: Total 6 Click Score: 9    End of Session    OT Visit Diagnosis: Muscle weakness (generalized) (M62.81);Low vision, both eyes (H54.2);Pain   Activity Tolerance Patient tolerated treatment well   Patient Left in bed;with call bell/phone within reach;with bed alarm set   Nurse Communication Patient requests pain meds        Time: 6073-7106 OT Time Calculation (min): 13 min  Charges: OT General Charges $OT Visit: 1 Visit OT Treatments $Self Care/Home Management : 8-22 mins  Kari Baars, OT Acute Rehabilitation  Services [phone] Office- [phone], Thereasa Parkin 03/01/2020, 4:50 PM

## 2020-03-01 NOTE — Progress Notes (Deleted)
Notified Md that patient went to MRI without IV access, MD stated that it was ok at this moment to do so.

## 2020-03-01 NOTE — TOC Initial Note (Signed)
Transition of Care [facility]) - Initial/Assessment Note    Patient Details  Name: Suzanne Levy MRN: [id number] Date of Birth: 1950-10-04  [facility]) CM/SW Contact:    Curlene Labrum, RN Phone Number: 03/01/2020, 9:12 AM  Clinical Narrative:                 Case Management completed SNF workup for placement - Will offer Medicare choice to patient for SNF bed availability with HD support - Patient receives HD on MWF at [facility].  Will continue to follow for placement once medically stable.  Expected Discharge Plan: Skilled Nursing Facility Barriers to Discharge: Continued Medical Work up   Patient Goals and CMS Choice Patient states their goals for this hospitalization and ongoing recovery are:: To skilled nursing facility with HD transportation and support CMS Medicare.gov Compare Post Acute Care list provided to:: Patient Choice offered to / list presented to : Patient  Expected Discharge Plan and Services Expected Discharge Plan: [place]   Discharge Planning Services: CM Consult Post Acute Care Choice: [place] Living arrangements for the past 2 months: Apartment                                      Prior Living Arrangements/Services Living arrangements for the past 2 months: Apartment Lives with:: Self (Has caregiver present in home 5 hrs/day - 7 days per week) Patient language and need for interpreter reviewed:: Yes Do you feel safe going back to the place where you live?: Yes      Need for Family Participation in Patient Care: Yes (Comment) Care giver support system in place?: Yes (comment) Current home services: Homehealth aide Criminal Activity/Legal Involvement Pertinent to Current Situation/Hospitalization: No - Comment as needed  Activities of Daily Living Home Assistive Devices/Equipment: Wheelchair, [employer] fast streamer ADL Screening (condition at time of admission) Patient's cognitive ability  adequate to safely complete daily activities?: Yes Is the patient deaf or have difficulty hearing?: No Does the patient have difficulty seeing, even when wearing glasses/contacts?: Yes Does the patient have difficulty concentrating, remembering, or making decisions?: No Patient able to express need for assistance with ADLs?: Yes Does the patient have difficulty dressing or bathing?: Yes Independently performs ADLs?: No Communication: Independent Dressing (OT): Needs assistance Is this a change from baseline?: Pre-admission baseline Grooming: Needs assistance Is this a change from baseline?: Pre-admission baseline Feeding: Needs assistance Is this a change from baseline?: Pre-admission baseline Bathing: Needs assistance Is this a change from baseline?: Pre-admission baseline Toileting: Needs assistance Is this a change from baseline?: Pre-admission baseline In/Out Bed: Dependent Is this a change from baseline?: Pre-admission baseline Walks in Home: Dependent Is this a change from baseline?: Pre-admission baseline Does the patient have difficulty walking or climbing stairs?: Yes Weakness of Legs: Both Weakness of Arms/Hands: None  Permission Sought/Granted Permission sought to share information with : Case Manager Permission granted to share information with : Yes, Verbal Permission Granted     Permission granted to share info w AGENCY: SNF facility        Emotional Assessment Appearance:: Appears stated age     Orientation: : Oriented to Self, Oriented to Place, Oriented to  Time, Oriented to Situation Alcohol / Substance Use: Never Used Psych Involvement: No (comment)  Admission diagnosis:  Hyperkalemia [E87.5] Right tibial fracture [S82.201A] Type I or II open displaced oblique fracture of shaft of  right tibia, initial encounter [S82.231B] Patient Active Problem List   Diagnosis Date Noted  . Hyperkalemia 02/29/2020  . Obesity, Class III, BMI 40-49.9 (morbid obesity)  ([place]) 02/29/2020  . Hyperglycemia due to diabetes mellitus ([place]) 02/29/2020  . Osteopenia 02/29/2020  . Macrocytic anemia 02/29/2020  . Abrasion of right knee 02/29/2020  . Asthma 02/29/2020  . ESRD (end stage renal disease) ([place]) 02/29/2020  . Pulmonary embolism ([place]) 02/29/2020  . Fibula fracture 02/29/2020  . Right tibial fracture 02/28/2020   PCP:  [name], MD Pharmacy:   [id number] - [place], [place] [address] [place] [address] Phone: [phone] Fax: [phone]     Social Determinants of Health (SDOH) Interventions    Readmission Risk Interventions Readmission Risk Prevention Plan 03/01/2020  Transportation Screening Complete  Medication Review ([place]) Complete  PCP or Specialist appointment within 3-5 days of discharge Complete  HRI or Home Care Consult Complete  SW Recovery Care/Counseling Consult Complete  Palliative Care Screening Complete  Skilled Nursing Facility Complete  Some recent data might be hidden

## 2020-03-01 NOTE — Progress Notes (Addendum)
[place] for Heparin >> apixaban Indication: hx PE (on apixaban), hx ESRD/dialysis, possible ortho surgery  Allergies  Allergen Reactions  . Enoxaparin Other (See Comments)    unknown  . 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 Other Other (See Comments)    Uncoded Allergy. Allergen: BLOOD THINNERS, Other Reaction: eye hemorrhage, seafood, (can eat salmon), acid drinks like orange juice,grape fruitt juice  . Ace Inhibitors Other (See Comments)    ESRD   . Aspirin Other (See Comments)    Eye hemorrhage  . Buprenorphine Hcl Other (See Comments)    unknown  . Codeine Itching  . Duloxetine Other (See Comments)    unknown  . Gentamicin Other (See Comments)    unknown  . Heparin Other (See Comments)    "depletes something in my system"  . Hydrocodone Itching  . Morphine And Related Other (See Comments)    unknown  . Oxycodone Itching  . Penicillins Other (See Comments)    unknown  . Vancomycin Other (See Comments)    unknown  . Cephalexin Rash  . Cephalosporins Rash  . Tomato Rash   Patient Measurements: Height: 5\' 11"  (180.3 cm) Weight: (!) 165.6 kg (365 lb 1.3 oz) IBW/kg (Calculated) : 70.8 Heparin Dosing Weight: 108kg  Vital Signs: Temp: 98.6 F (37 C) (07/22 0725) Temp Source: Oral (07/22 0725) BP: 104/43 (07/22 0725) Pulse Rate: 65 (07/22 0725)  Labs: Recent Labs    02/28/20 2138 02/28/20 2138 02/29/20 0210 02/29/20 0931 02/29/20 1219 02/29/20 2200 03/01/20 0305  HGB 9.8*   < >  --  8.8*  --   --  8.4*  HCT 32.0*  --   --  29.8*  --   --  28.0*  PLT 148*  --   --  129*  --   --  120*  APTT  --   --   --   --  92* 87* 89*  LABPROT  --   --  16.6* 18.4*  --   --   --   INR  --   --  1.4* 1.6*  --   --   --   HEPARINUNFRC  --   --   --   --  >2.20*  --  1.44*  CREATININE 9.26*  --   --  10.14*  --   --  6.24*   < > = values in this  interval not displayed.   Estimated Creatinine Clearance: 14.4 mL/min (A) (by C-G formula based on SCr of 6.24 mg/dL (H)).  Infusions:  . heparin 1,200 Units/hr (02/29/20 1513)    Assessment: 44 yoF with tibial fx, to [facility] ED > transfer to [facility], possible ortho surgery, Dialysis MWF. Pt on apixaban 5mg  po BID PTA for h/o PE - pt has been on this since Feb 2021 (prior to this, she was on warfarin) - Last PTA dose 7/20 afternoon. Pharmacy consulted for heparin dosing while apixaban on hold. As no surgery planned, transition back to apixaban.  PTT 89 sec (therapeutic) on gtt at 1200 units/hr. Apixaban still affecting heparin levels so will continue to utilize aPTT monitoring until levels correlate.  Discussed with MD. Faythe Ghee to transition back to apixaban given no planned surgery.  Goal of Therapy:  Monitor platelets by anticoagulation protocol: Yes   Plan:  Discontinue heparin infusion  Initiate apixaban 5mg  BID Monitor s/s bleeding  Alfonse Spruce, PharmD PGY2 ID Pharmacy Resident [phone]  Please see amion for complete clinical pharmacist phone list  03/01/2020   9:05 AM

## 2020-03-01 NOTE — Progress Notes (Signed)
PROGRESS NOTE    Suzanne Levy  VQQ:[id number] DOB: 1951-05-28 DOA: 02/28/2020 PCP: [name], MD     Brief Narrative:  Suzanne Levy is a 69 y.o. BF PMHx ESRD on HD (MWF), DM type II uncontrolled with neuropathy, h/o PE on Eliquis anticoagulation (managed by HD center), morbid obesity, asthma, HTN, and OSA, limited mobility with wheelchair dependence   who presented to the emergency department due to acute onset of persistent and worsening right knee pain which started today.  Patient was on her motorized wheelchair yesterday when she accidentally struck her right lower leg against the railing near the stairs outside of her apartment building.  She complained of sudden onset of severe right mid shin pain after the incident, with the pain extending to right ankle and right knee.  Pain was constant and worsens with movement.  Patient states that she went to dialysis today (though, dialysis days are MWF as indicated above) because she was told that more fluid needed to be taken off her.  She denies fever, chills, chest pain, shortness of breath, headache.  ED Course:  In the emergency department, she was hemodynamically stable.  Work-up in the ED showed macrocytic anemia, hypokalemia, BUN/creatinine 65/9.26 (no recent labs for comparison with last lab being 5 years ago at 9.66).  Right tibia and fibula x-ray showed acute obliquely slightly comminuted and slightly displaced fracture extending through the proximal tibial shaft.  Additional possible subtle cortical irregularity at the right fibular head/neck, which could reflect an additional subtle nondisplaced fracture was noted.  IV fentanyl was given in the ED due to pain.  Splint was placed on affected area of the leg.  Orthopedic surgeon (Dr. [name]) was consulted and will see patient in the morning once admitted by hospitalist team.  Hospitalist was asked to admit patient for further evaluation management.  Subjective: 7/22 afebrile  overnight, negative CP, negative abdominal pain.  States does not feel well, patient currently desatting secondary to pain medication.  Patient has continued to complain of RIGHT lower extremity pain all day and as pain medication has been titrated up sats have decreased.   Assessment & Plan: Covid vaccination; positive Covid vaccination   Principal Problem:   Right tibial fracture Active Problems:   Hyperkalemia   Obesity, Class III, BMI 40-49.9 (morbid obesity) (HCC)   Hyperglycemia due to diabetes mellitus (HCC)   Osteopenia   Macrocytic anemia   Abrasion of right knee   Asthma   ESRD (end stage renal disease) ([place])   Pulmonary embolism (HCC)   Fibula fracture   RIGHT Tibial fracture/Possible nondisplaced right fibular head/neck fracture -Per orthopedic surgery note 7/21 NO surgery contemplated -7/21 PT OT eval; permanent placement to long-term care facility, since surgery is not planning on fixing fracture in this morbidly obese, legally blind patient. -7/22 pain currently uncontrolled, will slowly titrate up pain medication.  Patient fentanyl 37.5 mcg q 4 hr PRN  -7/22 placed on telemetry and continuous pulse ox monitor  Given patient has ESRD will need to be mindful of buildup of narcotic medication.   Right knee abrasion -Minimal laceration  ESRD on HD M/W/F -BUN/creatinine 65/9.26 (no recent labs for comparison with last lab being 5 years ago at 9.66).  -7/23 patient should be dialyzed    Hyperkalemia  -K+ 6.2, no peaked T waves noted on ECG -Calcium gluconate and Lokelma given in the ED -Continue telemetry -7/21 potassium now down to 5.0, see ESRD  Macrocytic anemia? -H/H 9.8/32 MCV 105.3 -Anemia panel  consistent with acute inflammatory process such as her acute fracture.  Osteopenia? -Vitamin D, 25-hydroxy WNL  -Calcium was normal at 9.3  DM type II uncontrolled with complication -0/96 hemoglobin A1c= 7.1 -NPH 36 units q breakfast -NPH 20 units q  bedtime -Moderate SSI  Hx pulmonary embolism -Patient has history of PE  -Continue heparin drip.  Will discuss what agent to restart patient on as do not believe  ESRD patients can be on DOAC, and patient was on Eliquis.     Asthma -  Obesity, class III (BMI 47.42) -This was determined by height 5\' 11"  and weight 154.2 kg -Per EMR patient was counseled about diet and exercise when more stable.  However given that patient will be mostly bedbound this is unrealistic  Obstructive sleep apnea not on CPAP -CPAP per respiratory    DVT prophylaxis: Eliquis Code Status: Full Family Communication:  Status is: Inpatient    Dispo: The patient is from: Home              Anticipated d/c is to: Long-term care facility              Anticipated d/c date is: 8/1              Patient currently unstable      Consultants:  Orthopedic surgery  Procedures/Significant Events:    I have personally reviewed and interpreted all radiology studies and my findings are as above.  VENTILATOR SETTINGS:    Cultures   Antimicrobials:    Devices    LINES / TUBES:      Continuous Infusions:    Objective: Vitals:   02/29/20 2312 03/01/20 0028 03/01/20 0319 03/01/20 0725  BP: (!) 96/55 (!) 102/52 (!) 109/54 (!) 104/43  Pulse: 68 67 71 65  Resp: 16 17 20 17   Temp: 98 F (36.7 C) 98.7 F (37.1 C) 98.2 F (36.8 C) 98.6 F (37 C)  TempSrc: Oral Oral Oral Oral  SpO2: 98% 96% 93% 97%  Weight:      Height:        Intake/Output Summary (Last 24 hours) at 03/01/2020 1414 Last data filed at 03/01/2020 1100 Gross per 24 hour  Intake 835.26 ml  Output 1900 ml  Net -1064.74 ml   Filed Weights   02/29/20 0012 02/29/20 1916  Weight: (!) 154.2 kg (!) 165.6 kg   Physical Exam:  General: A/O x4, positive acute respiratory distress (iatrogenic secondary to pain medication) Eyes: negative scleral hemorrhage, negative anisocoria, negative icterus ENT: Negative Runny nose,  negative gingival bleeding, Neck:  Negative scars, masses, torticollis, lymphadenopathy, JVD Lungs: Clear to auscultation bilaterally without wheezes or crackles Cardiovascular: Regular rate and rhythm without murmur gallop or rub normal S1 and S2 Abdomen: MORBIDLY OBESE abdominal pain, nondistended, positive soft, bowel sounds, no rebound, no ascites, no appreciable mass Extremities: RIGHT lower extremity in splint and brace elevated at 40 degrees Skin: Negative rashes, lesions, ulcers Psychiatric:  Negative depression, negative anxiety, negative fatigue, negative mania  Central nervous system:  Cranial nerves II through XII intact, tongue/uvula midline, all extremities muscle strength 5/5, sensation intact throughout, negative dysarthria, negative expressive aphasia, negative receptive aphasia.  .     Data Reviewed: Care during the described time interval was provided by me .  I have reviewed this patient's available data, including medical history, events of note, physical examination, and all test results as part of my evaluation.  CBC: Recent Labs  Lab 02/28/20 2138 02/29/20 0931 03/01/20 0305  WBC 10.0 7.9 7.7  NEUTROABS 8.4* 6.2 6.1  HGB 9.8* 8.8* 8.4*  HCT 32.0* 29.8* 28.0*  MCV 105.3* 103.8* 104.1*  PLT 148* 129* 924*   Basic Metabolic Panel: Recent Labs  Lab 02/28/20 2138 02/29/20 0118 02/29/20 0931 03/01/20 0305  NA 135  --  134* 133*  K 6.2*  --  5.0 4.3  CL 91*  --  93* 94*  CO2 24  --  24 21*  GLUCOSE 262* 220* 174* 240*  BUN 65*  --  64* 35*  CREATININE 9.26*  --  10.14* 6.24*  CALCIUM 9.3  --  9.4 8.5*  MG  --   --  2.4 2.0  PHOS  --   --  8.3* 5.4*   GFR: Estimated Creatinine Clearance: 14.4 mL/min (A) (by C-G formula based on SCr of 6.24 mg/dL (H)). Liver Function Tests: Recent Labs  Lab 02/29/20 0931 03/01/20 0305  AST 11* 17  ALT 16 14  ALKPHOS 100 90  BILITOT 1.1 0.8  PROT 7.9 7.1  ALBUMIN 2.9* 2.8*   No results for input(s): LIPASE,  AMYLASE in the last 168 hours. No results for input(s): AMMONIA in the last 168 hours. Coagulation Profile: Recent Labs  Lab 02/29/20 0210 02/29/20 0931  INR 1.4* 1.6*   Cardiac Enzymes: No results for input(s): CKTOTAL, CKMB, CKMBINDEX, TROPONINI in the last 168 hours. BNP (last 3 results) No results for input(s): PROBNP in the last 8760 hours. HbA1C: No results for input(s): HGBA1C in the last 72 hours. CBG: Recent Labs  Lab 02/29/20 0640 02/29/20 1210 02/29/20 1647 03/01/20 0635 03/01/20 1151  GLUCAP 179* 200* 241* 230* 259*   Lipid Profile: No results for input(s): CHOL, HDL, LDLCALC, TRIG, CHOLHDL, LDLDIRECT in the last 72 hours. Thyroid Function Tests: No results for input(s): TSH, T4TOTAL, FREET4, T3FREE, THYROIDAB in the last 72 hours. Anemia Panel: Recent Labs    02/29/20 0141 03/01/20 0305  VITAMINB12 1,497* 1,322*  FOLATE 90.1 29.8  FERRITIN  --  >7,500*  TIBC  --  270  IRON  --  59  RETICCTPCT  --  3.0   Sepsis Labs: No results for input(s): PROCALCITON, LATICACIDVEN in the last 168 hours.  Recent Results (from the past 240 hour(s))  SARS Coronavirus 2 by RT PCR (hospital order, performed in [facility] hospital lab) Nasopharyngeal Nasopharyngeal Swab     Status: None   Collection Time: 02/28/20 11:23 PM   Specimen: Nasopharyngeal Swab  Result Value Ref Range Status   SARS Coronavirus 2 NEGATIVE NEGATIVE Final    Comment: (NOTE) SARS-CoV-2 target nucleic acids are NOT DETECTED.  The SARS-CoV-2 RNA is generally detectable in upper and lower respiratory specimens during the acute phase of infection. The lowest concentration of SARS-CoV-2 viral copies this assay can detect is 250 copies / mL. A negative result does not preclude SARS-CoV-2 infection and should not be used as the sole basis for treatment or other patient management decisions.  A negative result may occur with improper specimen collection / handling, submission of specimen other than  nasopharyngeal swab, presence of viral mutation(s) within the areas targeted by this assay, and inadequate number of viral copies (<250 copies / mL). A negative result must be combined with clinical observations, patient history, and epidemiological information.  Fact Sheet for Patients:   StrictlyIdeas.no  Fact Sheet for Healthcare Providers: BankingDealers.co.za  This test is not yet approved or  cleared by the [place] FDA and has been authorized for detection and/or diagnosis  of SARS-CoV-2 by FDA under an Emergency Use Authorization (EUA).  This EUA will remain in effect (meaning this test can be used) for the duration of the COVID-19 declaration under Section 564(b)(1) of the Act, 21 U.S.C. section 360bbb-3(b)(1), unless the authorization is terminated or revoked sooner.  Performed at [facility], [place] [address] [place], [place] [address]          Radiology Studies: DG Tibia/Fibula Right  Result Date: 02/28/2020 CLINICAL DATA:  Initial evaluation for acute pain status post injury. EXAM: RIGHT TIBIA AND FIBULA - 2 VIEW COMPARISON:  None. FINDINGS: Examination technically limited by positioning and underlying severe osteopenia. Single fixation screw traverses the proximal tibial shaft. Just inferiorly, there is an acute oblique slightly comminuted and slightly displaced fracture extending through the proximal tibial shaft. Additional possible subtle cortical irregularity at the right fibular head/neck, which could reflect an additional subtle nondisplaced fracture. No other definite acute osseous abnormality. Advanced osteoarthritic changes noted about the knee and ankle. No visible soft tissue injury. Extensive vascular calcifications noted. IMPRESSION: 1. Acute oblique slightly comminuted and slightly displaced fracture extending through the proximal tibial shaft. 2. Additional possible subtle cortical  irregularity at the right fibular head/neck, which could reflect an additional subtle nondisplaced fracture. Correlation with physical exam recommended. 3. Underlying severe osteopenia. Electronically Signed   By: Jeannine Boga M.D.   On: 02/28/2020 21:22   DG Ankle Complete Right  Result Date: 02/28/2020 CLINICAL DATA:  Initial evaluation for acute pain status post injury. EXAM: RIGHT ANKLE - COMPLETE 3+ VIEW COMPARISON:  None. FINDINGS: Examination is technically limited by patient positioning. Additionally, severe osteopenia limits evaluation for possible subtle acute nondisplaced fractures. No definite acute fracture or dislocation. Ankle mortise approximated. No definite abnormality about the visualized foot. No visible soft tissue injury. Extensive vascular calcifications noted. IMPRESSION: Technically limited study due to patient positioning and severe osteopenia. No definite acute osseous abnormality about the right ankle. Electronically Signed   By: Jeannine Boga M.D.   On: 02/28/2020 21:11   DG Knee Complete 4 Views Right  Result Date: 02/28/2020 CLINICAL DATA:  Initial evaluation for acute pain status post injury. EXAM: RIGHT KNEE - COMPLETE 4+ VIEW COMPARISON:  None. FINDINGS: Examination technically limited by patient positioning and severe osteopenia. Single fixation screw traverses the proximal tibia. There is an acute oblique slightly comminuted and displaced fracture extending through the proximal right tibial shaft. Question additional subtle cortical irregularity at the right fibular head/neck, which could reflect an additional nondisplaced fracture. No other definite acute osseous abnormality. Visualized distal femur intact. Patella not well visualized. Severe osteoarthritic changes present about the knee. No obvious or visible joint effusion. Extensive vascular calcifications seen throughout the visualized soft tissues. IMPRESSION: 1. Acute oblique slightly comminuted and  displaced fracture extending through the proximal right tibial shaft. 2. Question additional subtle cortical irregularity at the right fibular head/neck, which could reflect an additional nondisplaced fracture. Correlation with physical exam recommended. 3. Underlying severe osteopenia with advanced osteoarthritic changes about the right knee. Electronically Signed   By: Jeannine Boga M.D.   On: 02/28/2020 21:19        Scheduled Meds: . apixaban  5 mg Oral BID  . calcium carbonate  1,250 mg Oral Q breakfast  . carvedilol  12.5 mg Oral BID WC  . Chlorhexidine Gluconate Cloth  6 each Topical Q0600  . [START ON 03/02/2020] doxercalciferol  2 mcg Intravenous Q M,W,F-HD  . gabapentin  100 mg Oral TID  .  insulin aspart  0-15 Units Subcutaneous TID WC  . insulin aspart  0-5 Units Subcutaneous QHS  . insulin NPH Human  20 Units Subcutaneous QHS  . insulin NPH Human  36 Units Subcutaneous QAC breakfast  . montelukast  10 mg Oral QHS  . sevelamer carbonate  800 mg Oral TID WC   Continuous Infusions:    LOS: 2 days    Time spent:40 min    Daleigh Pollinger, Geraldo Docker, MD [facility] Pager [phone]  If 7PM-7AM, please contact night-coverage [email or web address] Password [facility] 03/01/2020, 2:14 PM

## 2020-03-01 NOTE — Progress Notes (Signed)
[facility] Progress Note   Subjective:  Completed dialysis yesterday with 1.9L UF. Some cramping during treatment. Seen in room. Pain level "ok" Denies cp, sob,n/v.   Objective Vitals:   02/29/20 2312 03/01/20 0028 03/01/20 0319 03/01/20 0725  BP: (!) 96/55 (!) 102/52 (!) 109/54 (!) 104/43  Pulse: 68 67 71 65  Resp: 16 17 20 17   Temp: 98 F (36.7 C) 98.7 F (37.1 C) 98.2 F (36.8 C) 98.6 F (37 C)  TempSrc: Oral Oral Oral Oral  SpO2: 98% 96% 93% 97%  Weight:      Height:         Physical Exam General: Obese woman, lying in bed nad  Heart: RRR Lungs: Clear anteriorly  Abdomen: soft nontender  Extremities: R LE bandaged, R pedal edema  Dialysis Access: LUE AVF +bruit   Additional Objective Labs: Basic Metabolic Panel: Recent Labs  Lab 02/28/20 2138 02/28/20 2138 02/29/20 0118 02/29/20 0931 03/01/20 0305  NA 135  --   --  134* 133*  K 6.2*  --   --  5.0 4.3  CL 91*  --   --  93* 94*  CO2 24  --   --  24 21*  GLUCOSE 262*   < > 220* 174* 240*  BUN 65*  --   --  64* 35*  CREATININE 9.26*  --   --  10.14* 6.24*  CALCIUM 9.3  --   --  9.4 8.5*  PHOS  --   --   --  8.3* 5.4*   < > = values in this interval not displayed.   CBC: Recent Labs  Lab 02/28/20 2138 02/29/20 0931 03/01/20 0305  WBC 10.0 7.9 7.7  NEUTROABS 8.4* 6.2 6.1  HGB 9.8* 8.8* 8.4*  HCT 32.0* 29.8* 28.0*  MCV 105.3* 103.8* 104.1*  PLT 148* 129* 120*   Blood Culture No results found for: SDES, SPECREQUEST, CULT, REPTSTATUS    Medications: . heparin 1,200 Units/hr (02/29/20 1513)   . acetaminophen      . calcium carbonate  1,250 mg Oral Q breakfast  . carvedilol  12.5 mg Oral BID WC  . Chlorhexidine Gluconate Cloth  6 each Topical Q0600  . [START ON 03/02/2020] doxercalciferol  2 mcg Intravenous Q M,W,F-HD  . gabapentin  100 mg Oral TID  . midodrine      . montelukast  10 mg Oral QHS  . sevelamer carbonate  800 mg Oral TID WC    Dialysis Orders:  AF MWF 4  hours and 15 minutes  BF 500, DF 800  EDW 156 kg  2K /2.25 Ca  AV graft   Not on heparin  Note that Coumadin is not on her outpatient medication list  Meds: mircera 150 mcg every 2 weeks; last given on 7/19  hectorol 2 mcg three times a week  On 7/19 left at 165.9 kg and on 7/20 left at 161.5 kg   Assessment/Plan: 1. Mechanical fall/R tibial fx - Management per orthopedics. No plans for surgery per notes.  2. ESRD - HD MWF. Continue on schedule. Next HD 7/23.  3. HTN/volume-  Soft BPs. Midodrine w HD for  BP support. Above EDW by weights here. UF as tolerated to ED.  4. Anemia-  Recent ESA dose as outpatient. Follow trends. Tsat 22% Ferritin >7500 likely inflammatory response.  5. MBD of CKD -  Continue Hectorol/binders  6. DM - per primary team. Rena/carb modified diet.   Lynnda Child PA-C [facility] 03/01/2020,9:17 AM

## 2020-03-01 NOTE — Progress Notes (Signed)
° °  Subjective:    Patient reports pain as moderate and severe.  BED FLAT NOT ELEVATED AS ORDERED.   Objective: Vital signs in last 24 hours: Temp:  [97.5 F (36.4 C)-98.7 F (37.1 C)] 98.6 F (37 C) (07/22 0725) Pulse Rate:  [65-76] 65 (07/22 0725) Resp:  [14-20] 17 (07/22 0725) BP: (66-109)/(26-82) 104/43 (07/22 0725) SpO2:  [93 %-100 %] 97 % (07/22 0725) Weight:  [165.6 kg] 165.6 kg (07/21 1916)  Intake/Output from previous day: 07/21 0701 - 07/22 0700 In: 835.3 [P.O.:720; I.V.:115.3] Out: 1900  Intake/Output this shift: No intake/output data recorded.  Recent Labs    02/28/20 2138 02/29/20 0931 03/01/20 0305  HGB 9.8* 8.8* 8.4*   Recent Labs    02/29/20 0931 03/01/20 0305  WBC 7.9 7.7  RBC 2.87* 2.69*   2.71*  HCT 29.8* 28.0*  PLT 129* 120*   Recent Labs    02/29/20 0931 03/01/20 0305  NA 134* 133*  K 5.0 4.3  CL 93* 94*  CO2 24 21*  BUN 64* 35*  CREATININE 10.14* 6.24*  GLUCOSE 174* 240*  CALCIUM 9.4 8.5*   Recent Labs    02/29/20 0210 02/29/20 0931  INR 1.4* 1.6*    BLEDSOE BRACE AND ACE WRAP ON. COMPARTMENTS ARE NOT TENSE No results found.  Assessment/Plan:    PLAN: NEEDS FOOT/LEG ABOVE HEART TO DECREASE BLEEDING IN THE LEG AND DECREASE RISKS OF COMPARTMENT SYNDOME.     SEEN HER 3 TIMES IN 24 HRS AND 3 TIME BED FLAT. LEG ELEVATED WITH HELP OF NURSES.   Marybelle Killings 03/01/2020, 7:40 AM

## 2020-03-02 DIAGNOSIS — S80211D Abrasion, right knee, subsequent encounter: Secondary | ICD-10-CM | POA: Diagnosis not present

## 2020-03-02 DIAGNOSIS — S82231B Displaced oblique fracture of shaft of right tibia, initial encounter for open fracture type I or II: Secondary | ICD-10-CM | POA: Diagnosis not present

## 2020-03-02 DIAGNOSIS — S82401A Unspecified fracture of shaft of right fibula, initial encounter for closed fracture: Secondary | ICD-10-CM | POA: Diagnosis not present

## 2020-03-02 DIAGNOSIS — J4520 Mild intermittent asthma, uncomplicated: Secondary | ICD-10-CM | POA: Diagnosis not present

## 2020-03-02 LAB — COMPREHENSIVE METABOLIC PANEL
ALT: 19 U/L (ref 0–44)
AST: 13 U/L — ABNORMAL LOW (ref 15–41)
Albumin: 2.8 g/dL — ABNORMAL LOW (ref 3.5–5.0)
Alkaline Phosphatase: 75 U/L (ref 38–126)
Anion gap: 15 (ref 5–15)
BUN: 50 mg/dL — ABNORMAL HIGH (ref 8–23)
CO2: 25 mmol/L (ref 22–32)
Calcium: 9.2 mg/dL (ref 8.9–10.3)
Chloride: 94 mmol/L — ABNORMAL LOW (ref 98–111)
Creatinine, Ser: 8.42 mg/dL — ABNORMAL HIGH (ref 0.44–1.00)
GFR calc Af Amer: 5 mL/min — ABNORMAL LOW (ref >60)
GFR calc non Af Amer: 4 mL/min — ABNORMAL LOW (ref >60)
Glucose, Bld: 102 mg/dL — ABNORMAL HIGH (ref 70–99)
Potassium: 4.2 mmol/L (ref 3.5–5.1)
Sodium: 134 mmol/L — ABNORMAL LOW (ref 135–145)
Total Bilirubin: 0.8 mg/dL (ref 0.3–1.2)
Total Protein: 7.3 g/dL (ref 6.5–8.1)

## 2020-03-02 LAB — CBC WITH DIFFERENTIAL/PLATELET
Abs Immature Granulocytes: 0.04 10*3/uL (ref 0.00–0.07)
Basophils Absolute: 0 10*3/uL (ref 0.0–0.1)
Basophils Relative: 0 %
Eosinophils Absolute: 0.2 10*3/uL (ref 0.0–0.5)
Eosinophils Relative: 3 %
HCT: 28.4 % — ABNORMAL LOW (ref 36.0–46.0)
Hemoglobin: 8.7 g/dL — ABNORMAL LOW (ref 12.0–15.0)
Immature Granulocytes: 1 %
Lymphocytes Relative: 9 %
Lymphs Abs: 0.7 10*3/uL (ref 0.7–4.0)
MCH: 31.4 pg (ref 26.0–34.0)
MCHC: 30.6 g/dL (ref 30.0–36.0)
MCV: 102.5 fL — ABNORMAL HIGH (ref 80.0–100.0)
Monocytes Absolute: 0.9 10*3/uL (ref 0.1–1.0)
Monocytes Relative: 12 %
Neutro Abs: 5.7 10*3/uL (ref 1.7–7.7)
Neutrophils Relative %: 75 %
Platelets: 153 10*3/uL (ref 150–400)
RBC: 2.77 MIL/uL — ABNORMAL LOW (ref 3.87–5.11)
RDW: 17.6 % — ABNORMAL HIGH (ref 11.5–15.5)
WBC: 7.6 10*3/uL (ref 4.0–10.5)
nRBC: 0.4 % — ABNORMAL HIGH (ref 0.0–0.2)

## 2020-03-02 LAB — GLUCOSE, CAPILLARY
Glucose-Capillary: 91 mg/dL (ref 70–99)
Glucose-Capillary: 76 mg/dL (ref 70–99)
Glucose-Capillary: 173 mg/dL — ABNORMAL HIGH (ref 70–99)
Glucose-Capillary: 173 mg/dL — ABNORMAL HIGH (ref 70–99)

## 2020-03-02 LAB — PHOSPHORUS: Phosphorus: 7 mg/dL — ABNORMAL HIGH (ref 2.5–4.6)

## 2020-03-02 LAB — MAGNESIUM: Magnesium: 2.3 mg/dL (ref 1.7–2.4)

## 2020-03-02 MED ORDER — DOXERCALCIFEROL 4 MCG/2ML IV SOLN
INTRAVENOUS | Status: AC
  Administered 2020-03-02: 2 ug via INTRAVENOUS
  Filled 2020-03-02: qty 2

## 2020-03-02 MED ORDER — FENTANYL CITRATE (PF) 100 MCG/2ML IJ SOLN
INTRAMUSCULAR | Status: AC
  Administered 2020-03-02: 37.5 ug via INTRAVENOUS
  Filled 2020-03-02: qty 2

## 2020-03-02 MED ORDER — DIPHENHYDRAMINE HCL 25 MG PO CAPS
ORAL_CAPSULE | ORAL | Status: AC
  Administered 2020-03-02: 25 mg
  Filled 2020-03-02: qty 1

## 2020-03-02 MED ORDER — ACETAMINOPHEN 500 MG PO TABS
1000.0000 mg | ORAL_TABLET | Freq: Three times a day (TID) | ORAL | Status: DC
  Administered 2020-03-02 – 2020-03-09 (×19): 1000 mg via ORAL
  Filled 2020-03-02 (×21): qty 2

## 2020-03-02 MED ORDER — HYDROXYZINE HCL 25 MG PO TABS
25.0000 mg | ORAL_TABLET | Freq: Three times a day (TID) | ORAL | Status: DC | PRN
  Administered 2020-03-02 – 2020-03-07 (×8): 25 mg via ORAL
  Filled 2020-03-02 (×8): qty 1

## 2020-03-02 MED ORDER — MIDODRINE HCL 5 MG PO TABS
ORAL_TABLET | ORAL | Status: AC
  Administered 2020-03-02: 10 mg via ORAL
  Filled 2020-03-02: qty 2

## 2020-03-02 MED ORDER — GABAPENTIN 100 MG PO CAPS
200.0000 mg | ORAL_CAPSULE | Freq: Three times a day (TID) | ORAL | Status: DC
  Administered 2020-03-02 – 2020-03-05 (×9): 200 mg via ORAL
  Filled 2020-03-02 (×10): qty 2

## 2020-03-02 MED ORDER — HYDROXYZINE HCL 25 MG PO TABS
ORAL_TABLET | ORAL | Status: AC
  Filled 2020-03-02: qty 1

## 2020-03-02 NOTE — Progress Notes (Signed)
RT placed pt on CPAP dream station for the night on auto titrate of max 10 min 5 w/4 Lpm bled into the system. Pt respiratory status is stable at this time w/ no distress noted. RT will continue to monitor.

## 2020-03-02 NOTE — Discharge Instructions (Signed)
Keep Bledsoe brace on right leg.  Keep elevated to decrease pain and swelling. See Dr. [name] in 2 wks.

## 2020-03-02 NOTE — Progress Notes (Signed)
PT Cancellation Note  Patient Details Name: Suzanne Levy MRN: [id number] DOB: 1951-02-20   Cancelled Treatment:    Reason Eval/Treat Not Completed: Pain limiting ability to participate Pt declining therapy session due to pain. RN aware; pt unable to receive more pain medication currently.    Wyona Almas, PT, DPT Acute Rehabilitation Services Pager [phone] Office [phone]    Deno Etienne 03/02/2020, 3:05 PM

## 2020-03-02 NOTE — Progress Notes (Signed)
Patient will be NWB with Bledsoe brace that stays on. Office 2 wks follow up.

## 2020-03-02 NOTE — Progress Notes (Signed)
[facility] Progress Note   Subjective:  Seen on HD today - UFG 2.5L C/o pruritis o/w ok.   Objective Vitals:   03/02/20 0830 03/02/20 0900 03/02/20 0930 03/02/20 1000  BP: (!) 108/29 (!) 122/25 (!) 87/36 (!) 90/44  Pulse: 69 71 68 64  Resp:      Temp:      TempSrc:      SpO2:      Weight:      Height:         Physical Exam General: Obese woman, lying in bed nad  Heart: RRR Lungs: Clear anteriorly  Abdomen: soft nontender  Extremities: R LE bandaged, R pedal edema  Dialysis Access: LUE AVF +bruit - 2 needles in no issues  Additional Objective Labs: Basic Metabolic Panel: Recent Labs  Lab 02/29/20 0931 03/01/20 0305 03/02/20 0459  NA 134* 133* 134*  K 5.0 4.3 4.2  CL 93* 94* 94*  CO2 24 21* 25  GLUCOSE 174* 240* 102*  BUN 64* 35* 50*  CREATININE 10.14* 6.24* 8.42*  CALCIUM 9.4 8.5* 9.2  PHOS 8.3* 5.4* 7.0*   CBC: Recent Labs  Lab 02/28/20 2138 02/28/20 2138 02/29/20 0931 03/01/20 0305 03/02/20 0459  WBC 10.0   < > 7.9 7.7 7.6  NEUTROABS 8.4*   < > 6.2 6.1 5.7  HGB 9.8*   < > 8.8* 8.4* 8.7*  HCT 32.0*   < > 29.8* 28.0* 28.4*  MCV 105.3*  --  103.8* 104.1* 102.5*  PLT 148*   < > 129* 120* 153   < > = values in this interval not displayed.   Blood Culture No results found for: SDES, SPECREQUEST, CULT, REPTSTATUS    Medications:  . hydrOXYzine      . apixaban  5 mg Oral BID  . calcium carbonate  1,250 mg Oral Q breakfast  . carvedilol  12.5 mg Oral BID WC  . Chlorhexidine Gluconate Cloth  6 each Topical Q0600  . doxercalciferol  2 mcg Intravenous Q M,W,F-HD  . gabapentin  100 mg Oral TID  . insulin aspart  0-15 Units Subcutaneous TID WC  . insulin aspart  0-5 Units Subcutaneous QHS  . insulin NPH Human  20 Units Subcutaneous QHS  . insulin NPH Human  36 Units Subcutaneous QAC breakfast  . montelukast  10 mg Oral QHS  . sevelamer carbonate  800 mg Oral TID WC    Dialysis Orders:  AF MWF 4 hours and 15 minutes  BF 500, DF  800  EDW 156 kg  2K /2.25 Ca  AV graft   Not on heparin  Note that Coumadin is not on her outpatient medication list  Meds: mircera 150 mcg every 2 weeks; last given on 7/19  hectorol 2 mcg three times a week  On 7/19 left at 165.9 kg and on 7/20 left at 161.5 kg   Assessment/Plan: 1. Mechanical fall/R tibial fx - Management per orthopedics. No plans for surgery per notes. NWB to follow up.   2. ESRD - HD MWF. Continue on schedule. HD today.  3. HTN/volume-  Soft BPs. Midodrine w HD for  BP support. Above EDW by weights here but they are bed weights. UF as tolerated to EDW.  4. Anemia-  Recent ESA dose as outpatient. Follow trends. Tsat 22% Ferritin >7500 likely inflammatory response. Hb 8.7 5. MBD of CKD -  Continue Hectorol/binders  6. DM - per primary team. Rena/carb modified diet.   Jannifer Hick MD [facility] Pager [phone]

## 2020-03-02 NOTE — Progress Notes (Signed)
1500 Old wound noted to right flank area assessed by Dr Sherral Hammers and me. Cleansed and wet to dry dressing applied. Will change dressing daily.

## 2020-03-02 NOTE — Plan of Care (Signed)

## 2020-03-02 NOTE — Progress Notes (Signed)
PROGRESS NOTE    Suzanne Levy  GBT:[id number] DOB: Jan 20, 1951 DOA: 02/28/2020 PCP: [name], MD     Brief Narrative:  Suzanne Levy is a 70 y.o. BF PMHx ESRD on HD (MWF), DM type II uncontrolled with neuropathy, h/o PE on Eliquis anticoagulation (managed by HD center), morbid obesity, asthma, HTN, and OSA, limited mobility with wheelchair dependence   who presented to the emergency department due to acute onset of persistent and worsening right knee pain which started today.  Patient was on her motorized wheelchair yesterday when she accidentally struck her right lower leg against the railing near the stairs outside of her apartment building.  She complained of sudden onset of severe right mid shin pain after the incident, with the pain extending to right ankle and right knee.  Pain was constant and worsens with movement.  Patient states that she went to dialysis today (though, dialysis days are MWF as indicated above) because she was told that more fluid needed to be taken off her.  She denies fever, chills, chest pain, shortness of breath, headache.  ED Course:  In the emergency department, she was hemodynamically stable.  Work-up in the ED showed macrocytic anemia, hypokalemia, BUN/creatinine 65/9.26 (no recent labs for comparison with last lab being 5 years ago at 9.66).  Right tibia and fibula x-ray showed acute obliquely slightly comminuted and slightly displaced fracture extending through the proximal tibial shaft.  Additional possible subtle cortical irregularity at the right fibular head/neck, which could reflect an additional subtle nondisplaced fracture was noted.  IV fentanyl was given in the ED due to pain.  Splint was placed on affected area of the leg.  Orthopedic surgeon (Dr. [name]) was consulted and will see patient in the morning once admitted by hospitalist team.  Hospitalist was asked to admit patient for further evaluation management.  Subjective: 7/23 A/O x 4,  negative CP, negative abdominal pain.  Patient has caps right lateral flank pain where sutures have been removed in January (skin biopsy taken from area)   Assessment & Plan: Covid vaccination; positive Covid vaccination   Principal Problem:   Right tibial fracture Active Problems:   Hyperkalemia   Obesity, Class III, BMI 40-49.9 (morbid obesity) (HCC)   Hyperglycemia due to diabetes mellitus (HCC)   Osteopenia   Macrocytic anemia   Abrasion of right knee   Asthma   ESRD (end stage renal disease) ([place])   Pulmonary embolism (HCC)   Fibula fracture   RIGHT Tibial fracture/Possible nondisplaced right fibular head/neck fracture -Per orthopedic surgery note 7/21 NO surgery contemplated -7/21 PT OT eval; permanent placement to long-term care facility, since surgery is not planning on fixing fracture in this morbidly obese, legally blind patient.  Pain control -7/22 pain currently uncontrolled, will slowly titrate up pain medication.   -Patient fentanyl 37.5 mcg q 4 hr PRN  -7/22 placed on telemetry and continuous pulse ox monitor  Given patient has ESRD will need to be mindful of buildup of narcotic medication. -7/23 Tylenol 1000 mg TID -7/23 Gabapentin 200 mg TID  Right knee abrasion -Minimal laceration  ESRD on HD M/W/F -BUN/creatinine 65/9.26 (no recent labs for comparison with last lab being 5 years ago at 9.66).  -7/23 patient should be dialyzed    Hyperkalemia  -Resolved  Macrocytic anemia? -H/H 9.8/32 MCV 105.3 -Anemia panel consistent with acute inflammatory process such as her acute fracture.  Osteopenia? -Vitamin D, 25-hydroxy WNL  -Calcium was normal at 9.3  DM type II uncontrolled with complication -  7/22 hemoglobin A1c= 7.1 -NPH 36 units q breakfast -NPH 20 units q bedtime -Moderate SSI  Hx pulmonary embolism -Patient has history of PE  -Continue heparin drip.  Will discuss what agent to restart patient on as do not believe  ESRD patients can be  on DOAC, and patient was on Eliquis.     Asthma -  Obesity, class III (BMI 47.42) -This was determined by height 5\' 11"  and weight 154.2 kg -Per EMR patient was counseled about diet and exercise when more stable.  However given that patient will be mostly bedbound this is unrealistic  Obstructive sleep apnea not on CPAP -CPAP per respiratory  Right flank skin laceration -On inspection of area that patient pointed out where sutures have been removed there was an old Band-Aid when removed frank pus emanated. -Notified RN who observed area and flushed area will begin daily wet-to-dry dressing change    DVT prophylaxis: Eliquis Code Status: Full Family Communication: 7/23 Sister at bedside for discussion of plan of care answered all questions Status is: Inpatient    Dispo: The patient is from: Home              Anticipated d/c is to: Long-term care facility              Anticipated d/c date is: 8/1              Patient currently unstable      Consultants:  Orthopedic surgery  Procedures/Significant Events:    I have personally reviewed and interpreted all radiology studies and my findings are as above.  VENTILATOR SETTINGS:    Cultures   Antimicrobials:    Devices    LINES / TUBES:      Continuous Infusions:    Objective: Vitals:   03/02/20 1100 03/02/20 1130 03/02/20 1152 03/02/20 1322  BP: (!) 118/25 (!) 80/22 (!) 118/45 121/80  Pulse: 63 69 66   Resp:   17 17  Temp:   98.2 F (36.8 C) 98.7 F (37.1 C)  TempSrc:   Oral Oral  SpO2:   100% 94%  Weight:      Height:        Intake/Output Summary (Last 24 hours) at 03/02/2020 1521 Last data filed at 03/02/2020 1152 Gross per 24 hour  Intake --  Output 1600 ml  Net -1600 ml   Filed Weights   02/29/20 0012 02/29/20 1916  Weight: (!) 154.2 kg (!) 165.6 kg   Physical Exam:  General: A/O x4, positive acute respiratory distress (iatrogenic secondary to pain medication) Eyes: negative  scleral hemorrhage, negative anisocoria, negative icterus ENT: Negative Runny nose, negative gingival bleeding, Neck:  Negative scars, masses, torticollis, lymphadenopathy, JVD Lungs: Clear to auscultation bilaterally without wheezes or crackles Cardiovascular: Regular rate and rhythm without murmur gallop or rub normal S1 and S2 Abdomen: MORBIDLY OBESE abdominal pain, nondistended, positive soft, bowel sounds, no rebound, no ascites, no appreciable mass Extremities: RIGHT lower extremity in splint and brace elevated at 40 degrees Skin: RIGHT flank surgical incision/laceration with frank pus emanating from under Band-Aid. Psychiatric:  Negative depression, negative anxiety, negative fatigue, negative mania  Central nervous system:  Cranial nerves II through XII intact, tongue/uvula midline, all extremities muscle strength 5/5, sensation intact throughout, negative dysarthria, negative expressive aphasia, negative receptive aphasia.  .     Data Reviewed: Care during the described time interval was provided by me .  I have reviewed this patient's available data, including medical history, events of note,  physical examination, and all test results as part of my evaluation.  CBC: Recent Labs  Lab 02/28/20 2138 02/29/20 0931 03/01/20 0305 03/02/20 0459  WBC 10.0 7.9 7.7 7.6  NEUTROABS 8.4* 6.2 6.1 5.7  HGB 9.8* 8.8* 8.4* 8.7*  HCT 32.0* 29.8* 28.0* 28.4*  MCV 105.3* 103.8* 104.1* 102.5*  PLT 148* 129* 120* 193   Basic Metabolic Panel: Recent Labs  Lab 02/28/20 2138 02/29/20 0118 02/29/20 0931 03/01/20 0305 03/02/20 0459  NA 135  --  134* 133* 134*  K 6.2*  --  5.0 4.3 4.2  CL 91*  --  93* 94* 94*  CO2 24  --  24 21* 25  GLUCOSE 262* 220* 174* 240* 102*  BUN 65*  --  64* 35* 50*  CREATININE 9.26*  --  10.14* 6.24* 8.42*  CALCIUM 9.3  --  9.4 8.5* 9.2  MG  --   --  2.4 2.0 2.3  PHOS  --   --  8.3* 5.4* 7.0*   GFR: Estimated Creatinine Clearance: 10.7 mL/min (A) (by C-G  formula based on SCr of 8.42 mg/dL (H)). Liver Function Tests: Recent Labs  Lab 02/29/20 0931 03/01/20 0305 03/02/20 0459  AST 11* 17 13*  ALT 16 14 19   ALKPHOS 100 90 75  BILITOT 1.1 0.8 0.8  PROT 7.9 7.1 7.3  ALBUMIN 2.9* 2.8* 2.8*   No results for input(s): LIPASE, AMYLASE in the last 168 hours. No results for input(s): AMMONIA in the last 168 hours. Coagulation Profile: Recent Labs  Lab 02/29/20 0210 02/29/20 0931  INR 1.4* 1.6*   Cardiac Enzymes: No results for input(s): CKTOTAL, CKMB, CKMBINDEX, TROPONINI in the last 168 hours. BNP (last 3 results) No results for input(s): PROBNP in the last 8760 hours. HbA1C: Recent Labs    03/01/20 1134  HGBA1C 7.1*   CBG: Recent Labs  Lab 03/01/20 1151 03/01/20 1628 03/01/20 2045 03/02/20 0639 03/02/20 1301  GLUCAP 259* 284* 223* 91 76   Lipid Profile: No results for input(s): CHOL, HDL, LDLCALC, TRIG, CHOLHDL, LDLDIRECT in the last 72 hours. Thyroid Function Tests: No results for input(s): TSH, T4TOTAL, FREET4, T3FREE, THYROIDAB in the last 72 hours. Anemia Panel: Recent Labs    02/29/20 0141 03/01/20 0305  VITAMINB12 1,497* 1,322*  FOLATE 90.1 29.8  FERRITIN  --  >7,500*  TIBC  --  270  IRON  --  59  RETICCTPCT  --  3.0   Sepsis Labs: No results for input(s): PROCALCITON, LATICACIDVEN in the last 168 hours.  Recent Results (from the past 240 hour(s))  SARS Coronavirus 2 by RT PCR (hospital order, performed in [facility] hospital lab) Nasopharyngeal Nasopharyngeal Swab     Status: None   Collection Time: 02/28/20 11:23 PM   Specimen: Nasopharyngeal Swab  Result Value Ref Range Status   SARS Coronavirus 2 NEGATIVE NEGATIVE Final    Comment: (NOTE) SARS-CoV-2 target nucleic acids are NOT DETECTED.  The SARS-CoV-2 RNA is generally detectable in upper and lower respiratory specimens during the acute phase of infection. The lowest concentration of SARS-CoV-2 viral copies this assay can detect is  250 copies / mL. A negative result does not preclude SARS-CoV-2 infection and should not be used as the sole basis for treatment or other patient management decisions.  A negative result may occur with improper specimen collection / handling, submission of specimen other than nasopharyngeal swab, presence of viral mutation(s) within the areas targeted by this assay, and inadequate number of viral copies (<250 copies /  mL). A negative result must be combined with clinical observations, patient history, and epidemiological information.  Fact Sheet for Patients:   StrictlyIdeas.no  Fact Sheet for Healthcare Providers: BankingDealers.co.za  This test is not yet approved or  cleared by the [place] FDA and has been authorized for detection and/or diagnosis of SARS-CoV-2 by FDA under an Emergency Use Authorization (EUA).  This EUA will remain in effect (meaning this test can be used) for the duration of the COVID-19 declaration under Section 564(b)(1) of the Act, 21 U.S.C. section 360bbb-3(b)(1), unless the authorization is terminated or revoked sooner.  Performed at [facility], [address] [place], [place] [address]          Radiology Studies: No results found.      Scheduled Meds: . acetaminophen  1,000 mg Oral Q8H  . apixaban  5 mg Oral BID  . calcium carbonate  1,250 mg Oral Q breakfast  . carvedilol  12.5 mg Oral BID WC  . Chlorhexidine Gluconate Cloth  6 each Topical Q0600  . doxercalciferol  2 mcg Intravenous Q M,W,F-HD  . gabapentin  200 mg Oral TID  . hydrOXYzine      . insulin aspart  0-15 Units Subcutaneous TID WC  . insulin aspart  0-5 Units Subcutaneous QHS  . insulin NPH Human  20 Units Subcutaneous QHS  . insulin NPH Human  36 Units Subcutaneous QAC breakfast  . montelukast  10 mg Oral QHS  . sevelamer carbonate  800 mg Oral TID WC   Continuous Infusions:    LOS: 3 days     Time spent:40 min    Mesha Schamberger, Geraldo Docker, MD [facility] Pager [phone]  If 7PM-7AM, please contact night-coverage [email or web address] Password [email or web address] 03/02/2020, 3:21 PM

## 2020-03-03 DIAGNOSIS — S80211D Abrasion, right knee, subsequent encounter: Secondary | ICD-10-CM | POA: Diagnosis not present

## 2020-03-03 DIAGNOSIS — S82401A Unspecified fracture of shaft of right fibula, initial encounter for closed fracture: Secondary | ICD-10-CM | POA: Diagnosis not present

## 2020-03-03 DIAGNOSIS — J4520 Mild intermittent asthma, uncomplicated: Secondary | ICD-10-CM | POA: Diagnosis not present

## 2020-03-03 DIAGNOSIS — S82231B Displaced oblique fracture of shaft of right tibia, initial encounter for open fracture type I or II: Secondary | ICD-10-CM | POA: Diagnosis not present

## 2020-03-03 LAB — COMPREHENSIVE METABOLIC PANEL
ALT: 16 U/L (ref 0–44)
AST: 23 U/L (ref 15–41)
Albumin: 2.5 g/dL — ABNORMAL LOW (ref 3.5–5.0)
Alkaline Phosphatase: 80 U/L (ref 38–126)
Anion gap: 12 (ref 5–15)
BUN: 29 mg/dL — ABNORMAL HIGH (ref 8–23)
CO2: 27 mmol/L (ref 22–32)
Calcium: 8.9 mg/dL (ref 8.9–10.3)
Chloride: 94 mmol/L — ABNORMAL LOW (ref 98–111)
Creatinine, Ser: 5.86 mg/dL — ABNORMAL HIGH (ref 0.44–1.00)
GFR calc Af Amer: 8 mL/min — ABNORMAL LOW (ref >60)
GFR calc non Af Amer: 7 mL/min — ABNORMAL LOW (ref >60)
Glucose, Bld: 174 mg/dL — ABNORMAL HIGH (ref 70–99)
Potassium: 4.1 mmol/L (ref 3.5–5.1)
Sodium: 133 mmol/L — ABNORMAL LOW (ref 135–145)
Total Bilirubin: 0.6 mg/dL (ref 0.3–1.2)
Total Protein: 6.6 g/dL (ref 6.5–8.1)

## 2020-03-03 LAB — GLUCOSE, CAPILLARY
Glucose-Capillary: 90 mg/dL (ref 70–99)
Glucose-Capillary: 133 mg/dL — ABNORMAL HIGH (ref 70–99)
Glucose-Capillary: 105 mg/dL — ABNORMAL HIGH (ref 70–99)
Glucose-Capillary: 159 mg/dL — ABNORMAL HIGH (ref 70–99)

## 2020-03-03 LAB — MAGNESIUM: Magnesium: 2.1 mg/dL (ref 1.7–2.4)

## 2020-03-03 LAB — PHOSPHORUS: Phosphorus: 5.4 mg/dL — ABNORMAL HIGH (ref 2.5–4.6)

## 2020-03-03 MED ORDER — ALBUMIN HUMAN 25 % IV SOLN
25.0000 g | Freq: Once | INTRAVENOUS | Status: AC
  Administered 2020-03-03: 25 g via INTRAVENOUS
  Filled 2020-03-03: qty 100

## 2020-03-03 MED ORDER — MIDODRINE HCL 5 MG PO TABS: 10.0000 mg | ORAL_TABLET | ORAL | Status: DC

## 2020-03-03 MED ORDER — MIDODRINE HCL 5 MG PO TABS
10.0000 mg | ORAL_TABLET | ORAL | Status: DC
  Administered 2020-03-03 – 2020-03-04 (×2): 10 mg via ORAL
  Filled 2020-03-03 (×2): qty 2

## 2020-03-03 NOTE — Progress Notes (Signed)
[facility] Progress Note   Subjective:  Told 1.6L UF with HD yesterday. C/o poorly controlled pain today - asked she d/w primary re: pain control to allow therapy participation.   Objective Vitals:   03/03/20 0057 03/03/20 0300 03/03/20 0859 03/03/20 0908  BP:  (!) 129/84 (!) 92/45 (!) 92/45  Pulse: 64 64 72 68  Resp: 17 19    Temp:  98.7 F (37.1 C)  97.9 F (36.6 C)  TempSrc:  Axillary  Oral  SpO2: 93% 97%  95%  Weight:      Height:         Physical Exam General: Obese woman, lying in bed nad  Heart: RRR Lungs: Clear anteriorly  Abdomen: soft nontender  Extremities: R LE bandaged, R pedal edema  Dialysis Access: LUE AVG +bruit  Additional Objective Labs: Basic Metabolic Panel: Recent Labs  Lab 03/01/20 0305 03/02/20 0459 03/03/20 0238  NA 133* 134* 133*  K 4.3 4.2 4.1  CL 94* 94* 94*  CO2 21* 25 27  GLUCOSE 240* 102* 174*  BUN 35* 50* 29*  CREATININE 6.24* 8.42* 5.86*  CALCIUM 8.5* 9.2 8.9  PHOS 5.4* 7.0* 5.4*   CBC: Recent Labs  Lab 02/28/20 2138 02/28/20 2138 02/29/20 0931 03/01/20 0305 03/02/20 0459  WBC 10.0   < > 7.9 7.7 7.6  NEUTROABS 8.4*   < > 6.2 6.1 5.7  HGB 9.8*   < > 8.8* 8.4* 8.7*  HCT 32.0*   < > 29.8* 28.0* 28.4*  MCV 105.3*  --  103.8* 104.1* 102.5*  PLT 148*   < > 129* 120* 153   < > = values in this interval not displayed.   Blood Culture No results found for: SDES, SPECREQUEST, CULT, REPTSTATUS    Medications:  . acetaminophen  1,000 mg Oral Q8H  . apixaban  5 mg Oral BID  . calcium carbonate  1,250 mg Oral Q breakfast  . carvedilol  12.5 mg Oral BID WC  . Chlorhexidine Gluconate Cloth  6 each Topical Q0600  . doxercalciferol  2 mcg Intravenous Q M,W,F-HD  . gabapentin  200 mg Oral TID  . insulin aspart  0-15 Units Subcutaneous TID WC  . insulin aspart  0-5 Units Subcutaneous QHS  . insulin NPH Human  20 Units Subcutaneous QHS  . insulin NPH Human  36 Units Subcutaneous QAC breakfast  . midodrine   10 mg Oral Daily  . montelukast  10 mg Oral QHS  . sevelamer carbonate  800 mg Oral TID WC    Dialysis Orders:  AF MWF 4 hours and 15 minutes  BF 500, DF 800  EDW 156 kg  2K /2.25 Ca  AV graft   Not on heparin  Note that Coumadin is not on her outpatient medication list  Meds: mircera 150 mcg every 2 weeks; last given on 7/19  hectorol 2 mcg three times a week  On 7/19 left at 165.9 kg and on 7/20 left at 161.5 kg   Assessment/Plan: 1. Mechanical fall/R tibial fx - Management per orthopedics. No plans for surgery per notes. NWB to follow up.  Pain control per primary 2. ESRD - HD MWF. Continue on schedule. HD yest. 3. HTN/volume-  Soft BPs. Midodrine w HD for  BP support - says she takes midodrine daily on non HD days and BID on HD days - changed order. Above EDW by weights here but they are bed weights. UF as tolerated to EDW.  4. Anemia-  Recent  ESA dose as outpatient. Follow trends. Tsat 22% Ferritin >7500 likely inflammatory response. Hb 8.7 7/23 5. MBD of CKD -  Continue Hectorol/binders  6. DM - per primary team. Rena/carb modified diet.   Jannifer Hick MD [facility] Pager [phone]

## 2020-03-03 NOTE — Plan of Care (Signed)
  Problem: Activity: Goal: Risk for activity intolerance will decrease Outcome: Not Progressing   Problem: Activity: Goal: Risk for activity intolerance will decrease Outcome: Not Progressing

## 2020-03-03 NOTE — Progress Notes (Signed)
RT placed pt on CPAP dream station on auto titrate of max 10 min 5 w/4 Lpm blended into the system. Pt respiratory status is stable at this time, no distress noted. RT will continue to monitor.

## 2020-03-03 NOTE — Progress Notes (Signed)
PROGRESS NOTE    Suzanne Levy  EHU:[id number] DOB: 11/27/1950 DOA: 02/28/2020 PCP: [name], MD     Brief Narrative:  Suzanne Levy is a 69 y.o. BF PMHx ESRD on HD (MWF), DM type II uncontrolled with neuropathy, h/o PE on Eliquis anticoagulation (managed by HD center), morbid obesity, asthma, HTN, and OSA, limited mobility with wheelchair dependence   who presented to the emergency department due to acute onset of persistent and worsening right knee pain which started today.  Patient was on her motorized wheelchair yesterday when she accidentally struck her right lower leg against the railing near the stairs outside of her apartment building.  She complained of sudden onset of severe right mid shin pain after the incident, with the pain extending to right ankle and right knee.  Pain was constant and worsens with movement.  Patient states that she went to dialysis today (though, dialysis days are MWF as indicated above) because she was told that more fluid needed to be taken off her.  She denies fever, chills, chest pain, shortness of breath, headache.  ED Course:  In the emergency department, she was hemodynamically stable.  Work-up in the ED showed macrocytic anemia, hypokalemia, BUN/creatinine 65/9.26 (no recent labs for comparison with last lab being 5 years ago at 9.66).  Right tibia and fibula x-ray showed acute obliquely slightly comminuted and slightly displaced fracture extending through the proximal tibial shaft.  Additional possible subtle cortical irregularity at the right fibular head/neck, which could reflect an additional subtle nondisplaced fracture was noted.  IV fentanyl was given in the ED due to pain.  Splint was placed on affected area of the leg.  Orthopedic surgeon (Dr. [name]) was consulted and will see patient in the morning once admitted by hospitalist team.  Hospitalist was asked to admit patient for further evaluation management.  Subjective: 7/24 A/O x4,  negative CP, negative abdominal pain.  Positive increased RLE pain with bed in rotational mode.    Assessment & Plan: Covid vaccination; positive Covid vaccination   Principal Problem:   Right tibial fracture Active Problems:   Hyperkalemia   Obesity, Class III, BMI 40-49.9 (morbid obesity) (HCC)   Hyperglycemia due to diabetes mellitus (HCC)   Osteopenia   Macrocytic anemia   Abrasion of right knee   Asthma   ESRD (end stage renal disease) ([facility])   Pulmonary embolism (HCC)   Fibula fracture   RIGHT Tibial fracture/Possible nondisplaced right fibular head/neck fracture -Per orthopedic surgery note 7/21 NO surgery contemplated -7/21 PT OT eval; permanent placement to long-term care facility, since surgery is not planning on fixing fracture in this morbidly obese, legally blind patient. -7/24 sent secure chat to [name] orthopedic surgeon requesting that we reconsider surgical fix to patient's lower extremity.  Over the last several days patient has not done well with her brace.  Pain control -7/22 pain currently uncontrolled, will slowly titrate up pain medication.   -Patient fentanyl 37.5 mcg q 4 hr PRN  -7/22 placed on telemetry and continuous pulse ox monitor  Given patient has ESRD will need to be mindful of buildup of narcotic medication. -7/23 Tylenol 1000 mg TID -7/23 Gabapentin 200 mg TID -7/24 counseled patient that if we can get her blood pressure increased and stabilized will attempt to increase her pain medication but currently unable to.  Right knee abrasion -Minimal laceration  ESRD on HD M/W/F -BUN/creatinine 65/9.26 (no recent labs for comparison with last lab being 5 years ago at 9.66).  -  7/23 patient should be dialyzed    Hypotension -7/24 Midodrine 10 mg SUN/T/TH/SAT -7/24 Albumin 25 g x 1  Hyperkalemia  -Resolved  Macrocytic anemia? -H/H 9.8/32 MCV 105.3 -Anemia panel consistent with acute inflammatory process such as her acute  fracture.  Osteopenia? -Vitamin D, 25-hydroxy WNL  -Calcium was normal at 9.3  DM type II uncontrolled with complication -4/69 hemoglobin A1c= 7.1 -NPH 36 units q breakfast -NPH 20 units q bedtime -Moderate SSI  Hx pulmonary embolism -Patient has history of PE  -Continue heparin drip.  Will discuss what agent to restart patient on as do not believe  ESRD patients can be on DOAC, and patient was on Eliquis.     Asthma -  Obesity, class III (BMI 47.42) -This was determined by height 5\' 11"  and weight 154.2 kg -Per EMR patient was counseled about diet and exercise when more stable.  However given that patient will be mostly bedbound this is unrealistic  Obstructive sleep apnea not on CPAP -CPAP per respiratory  Right flank skin laceration -On inspection of area that patient pointed out where sutures have been removed there was an old Band-Aid when removed frank pus emanated. -Notified RN who observed area and flushed area will begin daily wet-to-dry dressing change    DVT prophylaxis: Eliquis Code Status: Full Family Communication: 7/23 Sister at bedside for discussion of plan of care answered all questions Status is: Inpatient    Dispo: The patient is from: Home              Anticipated d/c is to: Long-term care facility              Anticipated d/c date is: 8/1              Patient currently unstable      Consultants:  Orthopedic surgery  Procedures/Significant Events:    I have personally reviewed and interpreted all radiology studies and my findings are as above.  VENTILATOR SETTINGS:    Cultures   Antimicrobials:    Devices    LINES / TUBES:      Continuous Infusions:    Objective: Vitals:   03/03/20 0057 03/03/20 0300 03/03/20 0859 03/03/20 0908  BP:  (!) 129/84 (!) 92/45 (!) 92/45  Pulse: 64 64 72 68  Resp: 17 19    Temp:  98.7 F (37.1 C)  97.9 F (36.6 C)  TempSrc:  Axillary  Oral  SpO2: 93% 97%  95%  Weight:       Height:        Intake/Output Summary (Last 24 hours) at 03/03/2020 1521 Last data filed at 03/03/2020 1300 Gross per 24 hour  Intake 360 ml  Output --  Net 360 ml   Filed Weights   02/29/20 0012 02/29/20 1916  Weight: (!) 154.2 kg (!) 165.6 kg   Physical Exam:  General: A/O x4, positive acute respiratory distress (iatrogenic secondary to pain medication) Eyes: negative scleral hemorrhage, negative anisocoria, negative icterus ENT: Negative Runny nose, negative gingival bleeding, Neck:  Negative scars, masses, torticollis, lymphadenopathy, JVD Lungs: Clear to auscultation bilaterally without wheezes or crackles Cardiovascular: Regular rate and rhythm without murmur gallop or rub normal S1 and S2 Abdomen: MORBIDLY OBESE abdominal pain, nondistended, positive soft, bowel sounds, no rebound, no ascites, no appreciable mass Extremities: RIGHT lower extremity in splint and brace elevated at 40 degrees Skin: RIGHT flank surgical incision/laceration with frank pus emanating from under Band-Aid. Psychiatric:  Negative depression, negative anxiety, negative fatigue, negative mania  Central nervous system:  Cranial nerves II through XII intact, tongue/uvula midline, all extremities muscle strength 5/5, sensation intact throughout, negative dysarthria, negative expressive aphasia, negative receptive aphasia.  .     Data Reviewed: Care during the described time interval was provided by me .  I have reviewed this patient's available data, including medical history, events of note, physical examination, and all test results as part of my evaluation.  CBC: Recent Labs  Lab 02/28/20 2138 02/29/20 0931 03/01/20 0305 03/02/20 0459  WBC 10.0 7.9 7.7 7.6  NEUTROABS 8.4* 6.2 6.1 5.7  HGB 9.8* 8.8* 8.4* 8.7*  HCT 32.0* 29.8* 28.0* 28.4*  MCV 105.3* 103.8* 104.1* 102.5*  PLT 148* 129* 120* 235   Basic Metabolic Panel: Recent Labs  Lab 02/28/20 2138 02/28/20 2138 02/29/20 0118  02/29/20 0931 03/01/20 0305 03/02/20 0459 03/03/20 0238  NA 135  --   --  134* 133* 134* 133*  K 6.2*  --   --  5.0 4.3 4.2 4.1  CL 91*  --   --  93* 94* 94* 94*  CO2 24  --   --  24 21* 25 27  GLUCOSE 262*   < > 220* 174* 240* 102* 174*  BUN 65*  --   --  64* 35* 50* 29*  CREATININE 9.26*  --   --  10.14* 6.24* 8.42* 5.86*  CALCIUM 9.3  --   --  9.4 8.5* 9.2 8.9  MG  --   --   --  2.4 2.0 2.3 2.1  PHOS  --   --   --  8.3* 5.4* 7.0* 5.4*   < > = values in this interval not displayed.   GFR: Estimated Creatinine Clearance: 15.3 mL/min (A) (by C-G formula based on SCr of 5.86 mg/dL (H)). Liver Function Tests: Recent Labs  Lab 02/29/20 0931 03/01/20 0305 03/02/20 0459 03/03/20 0238  AST 11* 17 13* 23  ALT 16 14 19 16   ALKPHOS 100 90 75 80  BILITOT 1.1 0.8 0.8 0.6  PROT 7.9 7.1 7.3 6.6  ALBUMIN 2.9* 2.8* 2.8* 2.5*   No results for input(s): LIPASE, AMYLASE in the last 168 hours. No results for input(s): AMMONIA in the last 168 hours. Coagulation Profile: Recent Labs  Lab 02/29/20 0210 02/29/20 0931  INR 1.4* 1.6*   Cardiac Enzymes: No results for input(s): CKTOTAL, CKMB, CKMBINDEX, TROPONINI in the last 168 hours. BNP (last 3 results) No results for input(s): PROBNP in the last 8760 hours. HbA1C: Recent Labs    03/01/20 1134  HGBA1C 7.1*   CBG: Recent Labs  Lab 03/02/20 1301 03/02/20 1648 03/02/20 2050 03/03/20 0736 03/03/20 1204  GLUCAP 76 173* 173* 90 133*   Lipid Profile: No results for input(s): CHOL, HDL, LDLCALC, TRIG, CHOLHDL, LDLDIRECT in the last 72 hours. Thyroid Function Tests: No results for input(s): TSH, T4TOTAL, FREET4, T3FREE, THYROIDAB in the last 72 hours. Anemia Panel: Recent Labs    03/01/20 0305  VITAMINB12 1,322*  FOLATE 29.8  FERRITIN >7,500*  TIBC 270  IRON 59  RETICCTPCT 3.0   Sepsis Labs: No results for input(s): PROCALCITON, LATICACIDVEN in the last 168 hours.  Recent Results (from the past 240 hour(s))  SARS  Coronavirus 2 by RT PCR (hospital order, performed in [facility] hospital lab) Nasopharyngeal Nasopharyngeal Swab     Status: None   Collection Time: 02/28/20 11:23 PM   Specimen: Nasopharyngeal Swab  Result Value Ref Range Status   SARS Coronavirus 2 NEGATIVE NEGATIVE Final  Comment: (NOTE) SARS-CoV-2 target nucleic acids are NOT DETECTED.  The SARS-CoV-2 RNA is generally detectable in upper and lower respiratory specimens during the acute phase of infection. The lowest concentration of SARS-CoV-2 viral copies this assay can detect is 250 copies / mL. A negative result does not preclude SARS-CoV-2 infection and should not be used as the sole basis for treatment or other patient management decisions.  A negative result may occur with improper specimen collection / handling, submission of specimen other than nasopharyngeal swab, presence of viral mutation(s) within the areas targeted by this assay, and inadequate number of viral copies (<250 copies / mL). A negative result must be combined with clinical observations, patient history, and epidemiological information.  Fact Sheet for Patients:   StrictlyIdeas.no  Fact Sheet for Healthcare Providers: BankingDealers.co.za  This test is not yet approved or  cleared by the [place] FDA and has been authorized for detection and/or diagnosis of SARS-CoV-2 by FDA under an Emergency Use Authorization (EUA).  This EUA will remain in effect (meaning this test can be used) for the duration of the COVID-19 declaration under Section 564(b)(1) of the Act, 21 U.S.C. section 360bbb-3(b)(1), unless the authorization is terminated or revoked sooner.  Performed at [facility], [place] [address], [place], [place] [address]          Radiology Studies: No results found.      Scheduled Meds:  acetaminophen  1,000 mg Oral Q8H   apixaban  5 mg Oral BID   calcium  carbonate  1,250 mg Oral Q breakfast   carvedilol  12.5 mg Oral BID WC   Chlorhexidine Gluconate Cloth  6 each Topical Q0600   doxercalciferol  2 mcg Intravenous Q M,W,F-HD   gabapentin  200 mg Oral TID   insulin aspart  0-15 Units Subcutaneous TID WC   insulin aspart  0-5 Units Subcutaneous QHS   insulin NPH Human  20 Units Subcutaneous QHS   insulin NPH Human  36 Units Subcutaneous QAC breakfast   midodrine  10 mg Oral Once per day on Sun Tue Thu Sat   [START ON 03/05/2020] midodrine  10 mg Oral 2 times per day on Mon Wed Fri   montelukast  10 mg Oral QHS   sevelamer carbonate  800 mg Oral TID WC   Continuous Infusions:    LOS: 4 days    Time spent:40 min    Clinton Wahlberg, Geraldo Docker, MD [facility] Pager [phone]  If 7PM-7AM, please contact night-coverage [email or web address] Password [email or web address] 03/03/2020, 3:21 PM

## 2020-03-04 DIAGNOSIS — S80211D Abrasion, right knee, subsequent encounter: Secondary | ICD-10-CM | POA: Diagnosis not present

## 2020-03-04 DIAGNOSIS — S82401A Unspecified fracture of shaft of right fibula, initial encounter for closed fracture: Secondary | ICD-10-CM | POA: Diagnosis not present

## 2020-03-04 DIAGNOSIS — E11620 Type 2 diabetes mellitus with diabetic dermatitis: Secondary | ICD-10-CM

## 2020-03-04 DIAGNOSIS — J4520 Mild intermittent asthma, uncomplicated: Secondary | ICD-10-CM | POA: Diagnosis not present

## 2020-03-04 DIAGNOSIS — S82231B Displaced oblique fracture of shaft of right tibia, initial encounter for open fracture type I or II: Secondary | ICD-10-CM | POA: Diagnosis not present

## 2020-03-04 LAB — GLUCOSE, CAPILLARY
Glucose-Capillary: 106 mg/dL — ABNORMAL HIGH (ref 70–99)
Glucose-Capillary: 120 mg/dL — ABNORMAL HIGH (ref 70–99)
Glucose-Capillary: 207 mg/dL — ABNORMAL HIGH (ref 70–99)
Glucose-Capillary: 210 mg/dL — ABNORMAL HIGH (ref 70–99)
Glucose-Capillary: 220 mg/dL — ABNORMAL HIGH (ref 70–99)
Glucose-Capillary: 225 mg/dL — ABNORMAL HIGH (ref 70–99)

## 2020-03-04 LAB — MAGNESIUM: Magnesium: 2.1 mg/dL (ref 1.7–2.4)

## 2020-03-04 LAB — COMPREHENSIVE METABOLIC PANEL
ALT: 17 U/L (ref 0–44)
AST: 13 U/L — ABNORMAL LOW (ref 15–41)
Albumin: 2.6 g/dL — ABNORMAL LOW (ref 3.5–5.0)
Alkaline Phosphatase: 84 U/L (ref 38–126)
Anion gap: 12 (ref 5–15)
BUN: 42 mg/dL — ABNORMAL HIGH (ref 8–23)
CO2: 26 mmol/L (ref 22–32)
Calcium: 8.7 mg/dL — ABNORMAL LOW (ref 8.9–10.3)
Chloride: 93 mmol/L — ABNORMAL LOW (ref 98–111)
Creatinine, Ser: 7.08 mg/dL — ABNORMAL HIGH (ref 0.44–1.00)
GFR calc Af Amer: 6 mL/min — ABNORMAL LOW (ref >60)
GFR calc non Af Amer: 5 mL/min — ABNORMAL LOW (ref >60)
Glucose, Bld: 142 mg/dL — ABNORMAL HIGH (ref 70–99)
Potassium: 3.7 mmol/L (ref 3.5–5.1)
Sodium: 131 mmol/L — ABNORMAL LOW (ref 135–145)
Total Bilirubin: 0.5 mg/dL (ref 0.3–1.2)
Total Protein: 6.7 g/dL (ref 6.5–8.1)

## 2020-03-04 LAB — PHOSPHORUS: Phosphorus: 6.2 mg/dL — ABNORMAL HIGH (ref 2.5–4.6)

## 2020-03-04 MED ORDER — ALBUMIN HUMAN 25 % IV SOLN
50.0000 g | Freq: Once | INTRAVENOUS | Status: AC
  Administered 2020-03-04: 50 g via INTRAVENOUS
  Filled 2020-03-04: qty 200

## 2020-03-04 MED ORDER — POLYETHYLENE GLYCOL 3350 17 G PO PACK
17.0000 g | PACK | Freq: Once | ORAL | Status: AC
  Administered 2020-03-04: 17 g via ORAL
  Filled 2020-03-04: qty 1

## 2020-03-04 MED ORDER — MIDODRINE HCL 5 MG PO TABS
10.0000 mg | ORAL_TABLET | Freq: Two times a day (BID) | ORAL | Status: DC
  Administered 2020-03-04 – 2020-03-09 (×11): 10 mg via ORAL
  Filled 2020-03-04 (×11): qty 2

## 2020-03-04 NOTE — Progress Notes (Signed)
PROGRESS NOTE    Darlen Levy  IEP:[id number] DOB: 1951/03/04 DOA: 02/28/2020 PCP: [name], MD     Brief Narrative:  Suzanne Levy is a 69 y.o. BF PMHx ESRD on HD (MWF), DM type II uncontrolled with neuropathy, h/o PE on Eliquis anticoagulation (managed by HD center), morbid obesity, asthma, HTN, and OSA, limited mobility with wheelchair dependence   who presented to the emergency department due to acute onset of persistent and worsening right knee pain which started today.  Patient was on her motorized wheelchair yesterday when she accidentally struck her right lower leg against the railing near the stairs outside of her apartment building.  She complained of sudden onset of severe right mid shin pain after the incident, with the pain extending to right ankle and right knee.  Pain was constant and worsens with movement.  Patient states that she went to dialysis today (though, dialysis days are MWF as indicated above) because she was told that more fluid needed to be taken off her.  She denies fever, chills, chest pain, shortness of breath, headache.  ED Course:  In the emergency department, she was hemodynamically stable.  Work-up in the ED showed macrocytic anemia, hypokalemia, BUN/creatinine 65/9.26 (no recent labs for comparison with last lab being 5 years ago at 9.66).  Right tibia and fibula x-ray showed acute obliquely slightly comminuted and slightly displaced fracture extending through the proximal tibial shaft.  Additional possible subtle cortical irregularity at the right fibular head/neck, which could reflect an additional subtle nondisplaced fracture was noted.  IV fentanyl was given in the ED due to pain.  Splint was placed on affected area of the leg.  Orthopedic surgeon (Dr. [name]) was consulted and will see patient in the morning once admitted by hospitalist team.  Hospitalist was asked to admit patient for further evaluation management.  Subjective: 7/25 afebrile  overnight A/O x4, negative CP, negative abdominal pain.  Feels constipated.  Still has waxing and waning RLE pain    Assessment & Plan: Covid vaccination; positive Covid vaccination   Principal Problem:   Right tibial fracture Active Problems:   Hyperkalemia   Obesity, Class III, BMI 40-49.9 (morbid obesity) (HCC)   Hyperglycemia due to diabetes mellitus (HCC)   Osteopenia   Macrocytic anemia   Abrasion of right knee   Asthma   ESRD (end stage renal disease) ([place])   Pulmonary embolism (HCC)   Fibula fracture   RIGHT Tibial fracture/Possible nondisplaced right fibular head/neck fracture -Per orthopedic surgery note 7/21 NO surgery contemplated -7/21 PT OT eval; permanent placement to long-term care facility, since surgery is not planning on fixing fracture in this morbidly obese, legally blind patient. -7/24 sent secure chat to [name] orthopedic surgeon requesting that we reconsider surgical fix to patient's lower extremity.  Over the last several days patient has not done well with her brace. -7/25 again attempted to contact orthopedic surgery unsuccessfully, to discuss possible reconsideration of operative plan.  We will try again in the morning when full team is available.  Pain control -7/22 pain currently uncontrolled, will slowly titrate up pain medication.   -Patient fentanyl 37.5 mcg q 4 hr PRN  -7/22 placed on telemetry and continuous pulse ox monitor  Given patient has ESRD will need to be mindful of buildup of narcotic medication. -7/23 Tylenol 1000 mg TID -7/23 Gabapentin 200 mg TID -7/24 counseled patient that if we can get her blood pressure increased and stabilized will attempt to increase her pain medication but  currently unable to.  Right knee abrasion -Minimal laceration  ESRD on HD M/W/F -BUN/creatinine 65/9.26 (no recent labs for comparison with last lab being 5 years ago at 9.66).  -7/23 patient should be dialyzed    Hypotension -7/25 Albumin  50 g x 1 -7/25 Midodrine 10 mg BID  Hyperkalemia  -Resolved  Macrocytic anemia? -H/H 9.8/32 MCV 105.3 -Anemia panel consistent with acute inflammatory process such as her acute fracture.  Osteopenia? -Vitamin D, 25-hydroxy WNL  -Calcium was normal at 9.3  DM type II uncontrolled with complication -2/72 hemoglobin A1c= 7.1 -NPH 36 units q breakfast -NPH 20 units q bedtime -Moderate SSI  Hx pulmonary embolism -Patient has history of PE  -Restarted Eliquis.     Asthma -  Obesity, class III (BMI 47.42) -This was determined by height 5\' 11"  and weight 154.2 kg -Per EMR patient was counseled about diet and exercise when more stable.  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 -Notified RN who observed area and flushed area will begin daily wet-to-dry dressing change    DVT prophylaxis: Eliquis Code Status: Full Family Communication: 7/23 Sister at bedside for discussion of plan of care answered all questions Status is: Inpatient    Dispo: The patient is from: Home              Anticipated d/c is to: Long-term care facility              Anticipated d/c date is: 8/1              Patient currently unstable      Consultants:  Orthopedic surgery  Procedures/Significant Events:    I have personally reviewed and interpreted all radiology studies and my findings are as above.  VENTILATOR SETTINGS:    Cultures   Antimicrobials:    Devices    LINES / TUBES:      Continuous Infusions:    Objective: Vitals:   03/04/20 0008 03/04/20 0320 03/04/20 0843 03/04/20 1300  BP:  (!) 108/56 (!)  91/41 (!) 88/61  Pulse: 64 69 68 60  Resp: 18 16 18 14   Temp:  98.4 F (36.9 C) 98 F (36.7 C) 98.4 F (36.9 C)  TempSrc:  Oral Oral Oral  SpO2: 95% 100% 94% 100%  Weight:      Height:        Intake/Output Summary (Last 24 hours) at 03/04/2020 1527 Last data filed at 03/04/2020 0300 Gross per 24 hour  Intake 240 ml  Output 0 ml  Net 240 ml   Filed Weights   02/29/20 0012 02/29/20 1916  Weight: (!) 154.2 kg (!) 165.6 kg    Physical Exam:  General: A/O x4 No acute respiratory distress Eyes: negative scleral hemorrhage, negative anisocoria, negative icterus ENT: Negative Runny nose, negative gingival bleeding, Neck:  Negative scars, masses, torticollis, lymphadenopathy, JVD Lungs: Clear to auscultation bilaterally without wheezes or crackles Cardiovascular: Regular rate and rhythm without murmur gallop or rub normal S1 and S2 Abdomen: MORBIDLY OBESE abdominal pain, nondistended, positive soft, bowel sounds, no rebound, no ascites, no appreciable mass Extremities: RIGHT lower extremity in splint and brace elevated at  40 degrees Skin: Necrobiosis Lipoidica under pannus left side, surgical laceration under pannus right side Psychiatric:  Negative depression, negative anxiety, negative fatigue, negative mania  Central nervous system:  Cranial nerves II through XII intact, tongue/uvula midline, all extremities muscle strength 5/5, sensation intact throughout, negative dysarthria, negative expressive aphasia, negative receptive aphasia. .  .     Data Reviewed: Care during the described time interval was provided by me .  I have reviewed this patient's available data, including medical history, events of note, physical examination, and all test results as part of my evaluation.  CBC: Recent Labs  Lab 02/28/20 2138 02/29/20 0931 03/01/20 0305 03/02/20 0459  WBC 10.0 7.9 7.7 7.6  NEUTROABS 8.4* 6.2 6.1 5.7  HGB 9.8* 8.8* 8.4* 8.7*  HCT 32.0* 29.8* 28.0* 28.4*  MCV 105.3*  103.8* 104.1* 102.5*  PLT 148* 129* 120* 056   Basic Metabolic Panel: Recent Labs  Lab 02/29/20 0931 03/01/20 0305 03/02/20 0459 03/03/20 0238 03/04/20 0235  NA 134* 133* 134* 133* 131*  K 5.0 4.3 4.2 4.1 3.7  CL 93* 94* 94* 94* 93*  CO2 24 21* 25 27 26   GLUCOSE 174* 240* 102* 174* 142*  BUN 64* 35* 50* 29* 42*  CREATININE 10.14* 6.24* 8.42* 5.86* 7.08*  CALCIUM 9.4 8.5* 9.2 8.9 8.7*  MG 2.4 2.0 2.3 2.1 2.1  PHOS 8.3* 5.4* 7.0* 5.4* 6.2*   GFR: Estimated Creatinine Clearance: 12.7 mL/min (A) (by C-G formula based on SCr of 7.08 mg/dL (H)). Liver Function Tests: Recent Labs  Lab 02/29/20 0931 03/01/20 0305 03/02/20 0459 03/03/20 0238 03/04/20 0235  AST 11* 17 13* 23 13*  ALT 16 14 19 16 17   ALKPHOS 100 90 75 80 84  BILITOT 1.1 0.8 0.8 0.6 0.5  PROT 7.9 7.1 7.3 6.6 6.7  ALBUMIN 2.9* 2.8* 2.8* 2.5* 2.6*   No results for input(s): LIPASE, AMYLASE in the last 168 hours. No results for input(s): AMMONIA in the last 168 hours. Coagulation Profile: Recent Labs  Lab 02/29/20 0210 02/29/20 0931  INR 1.4* 1.6*   Cardiac Enzymes: No results for input(s): CKTOTAL, CKMB, CKMBINDEX, TROPONINI in the last 168 hours. BNP (last 3 results) No results for input(s): PROBNP in the last 8760 hours. HbA1C: No results for input(s): HGBA1C in the last 72 hours. CBG: Recent Labs  Lab 03/03/20 1204 03/03/20 1652 03/03/20 2041 03/04/20 0625 03/04/20 1204  GLUCAP 133* 105* 159* 120* 207*   Lipid Profile: No results for input(s): CHOL, HDL, LDLCALC, TRIG, CHOLHDL, LDLDIRECT in the last 72 hours. Thyroid Function Tests: No results for input(s): TSH, T4TOTAL, FREET4, T3FREE, THYROIDAB in the last 72 hours. Anemia Panel: No results for input(s): VITAMINB12, FOLATE, FERRITIN, TIBC, IRON, RETICCTPCT in the last 72 hours. Sepsis Labs: No results for input(s): PROCALCITON, LATICACIDVEN in the last 168 hours.  Recent Results (from the past 240 hour(s))  SARS Coronavirus 2 by RT PCR  (hospital order, performed in [facility] hospital lab) Nasopharyngeal Nasopharyngeal Swab     Status: None   Collection Time: 02/28/20 11:23 PM   Specimen: Nasopharyngeal Swab  Result Value Ref Range Status   SARS Coronavirus 2 NEGATIVE NEGATIVE Final    Comment: (NOTE) SARS-CoV-2 target nucleic acids are NOT DETECTED.  The SARS-CoV-2 RNA is generally detectable in upper and lower respiratory specimens during the acute phase of infection. The lowest concentration of SARS-CoV-2 viral copies this assay can detect is 250 copies / mL. A negative result does not preclude SARS-CoV-2 infection and should  not be used as the sole basis for treatment or other patient management decisions.  A negative result may occur with improper specimen collection / handling, submission of specimen other than nasopharyngeal swab, presence of viral mutation(s) within the areas targeted by this assay, and inadequate number of viral copies (<250 copies / mL). A negative result must be combined with clinical observations, patient history, and epidemiological information.  Fact Sheet for Patients:   StrictlyIdeas.no  Fact Sheet for Healthcare Providers: BankingDealers.co.za  This test is not yet approved or  cleared by the [place] FDA and has been authorized for detection and/or diagnosis of SARS-CoV-2 by FDA under an Emergency Use Authorization (EUA).  This EUA will remain in effect (meaning this test can be used) for the duration of the COVID-19 declaration under Section 564(b)(1) of the Act, 21 U.S.C. section 360bbb-3(b)(1), unless the authorization is terminated or revoked sooner.  Performed at [facility], [place] [address], [place], [place] [address]          Radiology Studies: No results found.      Scheduled Meds: . acetaminophen  1,000 mg Oral Q8H  . apixaban  5 mg Oral BID  . calcium carbonate  1,250 mg Oral Q  breakfast  . carvedilol  12.5 mg Oral BID WC  . Chlorhexidine Gluconate Cloth  6 each Topical Q0600  . doxercalciferol  2 mcg Intravenous Q M,W,F-HD  . gabapentin  200 mg Oral TID  . insulin aspart  0-15 Units Subcutaneous TID WC  . insulin aspart  0-5 Units Subcutaneous QHS  . insulin NPH Human  20 Units Subcutaneous QHS  . insulin NPH Human  36 Units Subcutaneous QAC breakfast  . midodrine  10 mg Oral Once per day on Sun Tue Thu Sat  . [START ON 03/05/2020] midodrine  10 mg Oral 2 times per day on Mon Wed Fri  . montelukast  10 mg Oral QHS  . sevelamer carbonate  800 mg Oral TID WC   Continuous Infusions:    LOS: 5 days    Time spent:40 min    Jaimy Kliethermes, Geraldo Docker, MD [facility] Pager [phone]  If 7PM-7AM, please contact night-coverage [email or web address] Password [facility] 03/04/2020, 3:27 PM

## 2020-03-04 NOTE — Progress Notes (Signed)
[facility] Progress Note   Subjective:  C/o ongoing pain today, requesting different bed.  Constipated - primary working on that.   Objective Vitals:   03/03/20 1920 03/04/20 0008 03/04/20 0320 03/04/20 0843  BP: (!) 111/42  (!) 108/56 (!) 91/41  Pulse: 60 64 69 68  Resp: 16 18 16 18   Temp: 98.8 F (37.1 C)  98.4 F (36.9 C) 98 F (36.7 C)  TempSrc: Oral  Oral Oral  SpO2: 100% 95% 100% 94%  Weight:      Height:         Physical Exam General: Obese woman, lying in bed nad  Heart: RRR Lungs: Clear anteriorly  Abdomen: soft nontender  Extremities: R LE bandaged, trace R pedal edema  Dialysis Access: LUE AVG +bruit  Additional Objective Labs: Basic Metabolic Panel: Recent Labs  Lab 03/02/20 0459 03/03/20 0238 03/04/20 0235  NA 134* 133* 131*  K 4.2 4.1 3.7  CL 94* 94* 93*  CO2 25 27 26   GLUCOSE 102* 174* 142*  BUN 50* 29* 42*  CREATININE 8.42* 5.86* 7.08*  CALCIUM 9.2 8.9 8.7*  PHOS 7.0* 5.4* 6.2*   CBC: Recent Labs  Lab 02/28/20 2138 02/28/20 2138 02/29/20 0931 03/01/20 0305 03/02/20 0459  WBC 10.0   < > 7.9 7.7 7.6  NEUTROABS 8.4*   < > 6.2 6.1 5.7  HGB 9.8*   < > 8.8* 8.4* 8.7*  HCT 32.0*   < > 29.8* 28.0* 28.4*  MCV 105.3*  --  103.8* 104.1* 102.5*  PLT 148*   < > 129* 120* 153   < > = values in this interval not displayed.   Blood Culture No results found for: SDES, SPECREQUEST, CULT, REPTSTATUS    Medications:   acetaminophen  1,000 mg Oral Q8H   apixaban  5 mg Oral BID   calcium carbonate  1,250 mg Oral Q breakfast   carvedilol  12.5 mg Oral BID WC   Chlorhexidine Gluconate Cloth  6 each Topical Q0600   doxercalciferol  2 mcg Intravenous Q M,W,F-HD   gabapentin  200 mg Oral TID   insulin aspart  0-15 Units Subcutaneous TID WC   insulin aspart  0-5 Units Subcutaneous QHS   insulin NPH Human  20 Units Subcutaneous QHS   insulin NPH Human  36 Units Subcutaneous QAC breakfast   midodrine  10 mg Oral Once  per day on Sun Tue Thu Sat   [START ON 03/05/2020] midodrine  10 mg Oral 2 times per day on Mon Wed Fri   montelukast  10 mg Oral QHS   sevelamer carbonate  800 mg Oral TID WC    Dialysis Orders:  AF MWF 4 hours and 15 minutes  BF 500, DF 800  EDW 156 kg  2K /2.25 Ca  AV graft   Not on heparin  Note that Coumadin is not on her outpatient medication list  Meds: mircera 150 mcg every 2 weeks; last given on 7/19  hectorol 2 mcg three times a week  On 7/19 left at 165.9 kg and on 7/20 left at 161.5 kg   Assessment/Plan: 1. Mechanical fall/R tibial fx - Management per orthopedics. No plans for surgery per notes. NWB to follow up.  Pain control per primary 2. ESRD - HD MWF. Continue on schedule. HD tomorrow. 3. HTN/volume-  Soft BPs. Midodrine w HD for  BP support ; take daily and BID on HD days. Above EDW by weights here but they are bed weights.  UF as tolerated to EDW.  4. Anemia-  Recent ESA dose as outpatient. Follow trends. Tsat 22% Ferritin >7500 likely inflammatory response. Hb 8.7 7/23 5. MBD of CKD -  Continue Hectorol/binders  6. DM - per primary team. Rena/carb modified diet.   Jannifer Hick MD [facility] Pager [phone]

## 2020-03-04 NOTE — Progress Notes (Signed)
Patient placed on CPAP via nasal mask on auto settings, auto 10 cmH2O masx and 5 cmH2O min with a 4 L O2 bleed in.  Patient is tolerating at this time.     03/04/20 2346  BiPAP/CPAP/SIPAP  BiPAP/CPAP/SIPAP Pt Type Adult  Mask Type Nasal mask  Mask Size Medium  Respiratory Rate 20 breaths/min  EPAP  (auto 10 cmH2O masx and 5 cmH2O min)  Flow Rate 4 lpm  BiPAP/CPAP/SIPAP CPAP  Patient Home Equipment No  Auto Titrate Yes (auto 10 cmH2O masx and 5 cmH2O min)  BiPAP/CPAP /SiPAP Vitals  Bilateral Breath Sounds Clear;Diminished

## 2020-03-05 DIAGNOSIS — S82231B Displaced oblique fracture of shaft of right tibia, initial encounter for open fracture type I or II: Secondary | ICD-10-CM | POA: Diagnosis not present

## 2020-03-05 DIAGNOSIS — J4520 Mild intermittent asthma, uncomplicated: Secondary | ICD-10-CM | POA: Diagnosis not present

## 2020-03-05 DIAGNOSIS — S82401A Unspecified fracture of shaft of right fibula, initial encounter for closed fracture: Secondary | ICD-10-CM | POA: Diagnosis not present

## 2020-03-05 DIAGNOSIS — S80211D Abrasion, right knee, subsequent encounter: Secondary | ICD-10-CM | POA: Diagnosis not present

## 2020-03-05 LAB — COMPREHENSIVE METABOLIC PANEL
ALT: 17 U/L (ref 0–44)
AST: 14 U/L — ABNORMAL LOW (ref 15–41)
Albumin: 3.1 g/dL — ABNORMAL LOW (ref 3.5–5.0)
Alkaline Phosphatase: 90 U/L (ref 38–126)
Anion gap: 13 (ref 5–15)
BUN: 58 mg/dL — ABNORMAL HIGH (ref 8–23)
CO2: 25 mmol/L (ref 22–32)
Calcium: 9 mg/dL (ref 8.9–10.3)
Chloride: 92 mmol/L — ABNORMAL LOW (ref 98–111)
Creatinine, Ser: 9.24 mg/dL — ABNORMAL HIGH (ref 0.44–1.00)
GFR calc Af Amer: 4 mL/min — ABNORMAL LOW (ref >60)
GFR calc non Af Amer: 4 mL/min — ABNORMAL LOW (ref >60)
Glucose, Bld: 135 mg/dL — ABNORMAL HIGH (ref 70–99)
Potassium: 4.3 mmol/L (ref 3.5–5.1)
Sodium: 130 mmol/L — ABNORMAL LOW (ref 135–145)
Total Bilirubin: 0.3 mg/dL (ref 0.3–1.2)
Total Protein: 7.2 g/dL (ref 6.5–8.1)

## 2020-03-05 LAB — CBC
HCT: 26.9 % — ABNORMAL LOW (ref 36.0–46.0)
Hemoglobin: 8.3 g/dL — ABNORMAL LOW (ref 12.0–15.0)
MCH: 31.3 pg (ref 26.0–34.0)
MCHC: 30.9 g/dL (ref 30.0–36.0)
MCV: 101.5 fL — ABNORMAL HIGH (ref 80.0–100.0)
Platelets: 116 10*3/uL — ABNORMAL LOW (ref 150–400)
RBC: 2.65 MIL/uL — ABNORMAL LOW (ref 3.87–5.11)
RDW: 17.9 % — ABNORMAL HIGH (ref 11.5–15.5)
WBC: 9.9 10*3/uL (ref 4.0–10.5)
nRBC: 0 % (ref 0.0–0.2)

## 2020-03-05 LAB — GLUCOSE, CAPILLARY
Glucose-Capillary: 139 mg/dL — ABNORMAL HIGH (ref 70–99)
Glucose-Capillary: 175 mg/dL — ABNORMAL HIGH (ref 70–99)
Glucose-Capillary: 170 mg/dL — ABNORMAL HIGH (ref 70–99)
Glucose-Capillary: 196 mg/dL — ABNORMAL HIGH (ref 70–99)

## 2020-03-05 LAB — PHOSPHORUS: Phosphorus: 7.3 mg/dL — ABNORMAL HIGH (ref 2.5–4.6)

## 2020-03-05 LAB — MAGNESIUM: Magnesium: 2.6 mg/dL — ABNORMAL HIGH (ref 1.7–2.4)

## 2020-03-05 MED ORDER — ALTEPLASE 2 MG IJ SOLR: 2.0000 mg | Freq: Once | INTRAMUSCULAR | Status: DC | PRN

## 2020-03-05 MED ORDER — TRAMADOL HCL 50 MG PO TABS
50.0000 mg | ORAL_TABLET | Freq: Four times a day (QID) | ORAL | Status: DC
  Administered 2020-03-05: 50 mg via ORAL
  Filled 2020-03-05: qty 1

## 2020-03-05 MED ORDER — FENTANYL CITRATE (PF) 100 MCG/2ML IJ SOLN
25.0000 ug | Freq: Four times a day (QID) | INTRAMUSCULAR | Status: DC | PRN
  Administered 2020-03-05: 25 ug via INTRAVENOUS
  Filled 2020-03-05: qty 2

## 2020-03-05 MED ORDER — TRAMADOL HCL 50 MG PO TABS
50.0000 mg | ORAL_TABLET | Freq: Four times a day (QID) | ORAL | Status: DC
  Administered 2020-03-05 – 2020-03-07 (×9): 50 mg via ORAL
  Filled 2020-03-05 (×9): qty 1

## 2020-03-05 MED ORDER — FENTANYL 12 MCG/HR TD PT72: 1.0000 | MEDICATED_PATCH | TRANSDERMAL | Status: DC

## 2020-03-05 MED ORDER — SODIUM CHLORIDE 0.9 % IV SOLN: 100.0000 mL | INTRAVENOUS | Status: DC | PRN

## 2020-03-05 MED ORDER — PENTAFLUOROPROP-TETRAFLUOROETH EX AERO: 1.0000 "application " | INHALATION_SPRAY | CUTANEOUS | Status: DC | PRN

## 2020-03-05 MED ORDER — TRAMADOL HCL 50 MG PO TABS: 100.0000 mg | ORAL_TABLET | Freq: Four times a day (QID) | ORAL | Status: DC

## 2020-03-05 MED ORDER — CLOBETASOL PROPIONATE 0.05 % EX OINT
1.0000 "application " | TOPICAL_OINTMENT | Freq: Two times a day (BID) | CUTANEOUS | Status: DC
  Administered 2020-03-05 – 2020-03-09 (×8): 1 via TOPICAL
  Filled 2020-03-05 (×2): qty 15

## 2020-03-05 MED ORDER — GABAPENTIN 300 MG PO CAPS
300.0000 mg | ORAL_CAPSULE | Freq: Three times a day (TID) | ORAL | Status: DC
  Administered 2020-03-05 – 2020-03-09 (×13): 300 mg via ORAL
  Filled 2020-03-05 (×14): qty 1

## 2020-03-05 MED ORDER — LIDOCAINE-PRILOCAINE 2.5-2.5 % EX CREA: 1.0000 "application " | TOPICAL_CREAM | CUTANEOUS | Status: DC | PRN

## 2020-03-05 MED ORDER — DOXERCALCIFEROL 4 MCG/2ML IV SOLN
INTRAVENOUS | Status: AC
  Filled 2020-03-05: qty 2

## 2020-03-05 MED ORDER — LIDOCAINE HCL (PF) 1 % IJ SOLN: 5.0000 mL | INTRAMUSCULAR | Status: DC | PRN

## 2020-03-05 MED ORDER — HYDROXYZINE HCL 25 MG PO TABS
ORAL_TABLET | ORAL | Status: AC
  Filled 2020-03-05: qty 1

## 2020-03-05 NOTE — Progress Notes (Addendum)
PROGRESS NOTE    Suzanne Levy  GMW:[id number] DOB: 06-12-1951 DOA: 02/28/2020 PCP: [name], MD     Brief Narrative:  Suzanne Levy is a 69 y.o. BF PMHx ESRD on HD (MWF), DM type II uncontrolled with neuropathy, h/o PE on Eliquis anticoagulation (managed by HD center), morbid obesity, asthma, HTN, and OSA, limited mobility with wheelchair dependence   who presented to the emergency department due to acute onset of persistent and worsening right knee pain which started today.  Patient was on her motorized wheelchair yesterday when she accidentally struck her right lower leg against the railing near the stairs outside of her apartment building.  She complained of sudden onset of severe right mid shin pain after the incident, with the pain extending to right ankle and right knee.  Pain was constant and worsens with movement.  Patient states that she went to dialysis today (though, dialysis days are MWF as indicated above) because she was told that more fluid needed to be taken off her.  She denies fever, chills, chest pain, shortness of breath, headache.  ED Course:  In the emergency department, she was hemodynamically stable.  Work-up in the ED showed macrocytic anemia, hypokalemia, BUN/creatinine 65/9.26 (no recent labs for comparison with last lab being 5 years ago at 9.66).  Right tibia and fibula x-ray showed acute obliquely slightly comminuted and slightly displaced fracture extending through the proximal tibial shaft.  Additional possible subtle cortical irregularity at the right fibular head/neck, which could reflect an additional subtle nondisplaced fracture was noted.  IV fentanyl was given in the ED due to pain.  Splint was placed on affected area of the leg.  Orthopedic surgeon (Dr. [name]) was consulted and will see patient in the morning once admitted by hospitalist team.  Hospitalist was asked to admit patient for further evaluation management.  Subjective: 7/26 afebrile  overnight, A/O x4, negative CP, negative abdominal pain.  Resting comfortably.  As soon as she is aroused starts to talk about how painful her RLE currently is and that pain not relieved by medication.   Assessment & Plan: Covid vaccination; positive Covid vaccination   Principal Problem:   Right tibial fracture Active Problems:   Hyperkalemia   Obesity, Class III, BMI 40-49.9 (morbid obesity) (HCC)   Hyperglycemia due to diabetes mellitus (HCC)   Osteopenia   Macrocytic anemia   Abrasion of right knee   Asthma   ESRD (end stage renal disease) ([place])   Pulmonary embolism (HCC)   Fibula fracture   Necrobiosis lipoidica diabeticorum (HCC)   RIGHT Tibial fracture/Possible nondisplaced right fibular head/neck fracture -Per orthopedic surgery note 7/21 NO surgery contemplated -7/21 PT OT eval; permanent placement to long-term care facility, since surgery is not planning on fixing fracture in this morbidly obese, legally blind patient. -7/24 sent secure chat to [name] orthopedic surgeon requesting that we reconsider surgical fix to patient's lower extremity.  Over the last several days patient has not done well with her brace. -7/26 discussed case with [name] orthopedic surgeon, who explained that patient's fibula would not support any type of hardware and that the brace was the best treatment.   Pain control -7/22 pain currently uncontrolled, will slowly titrate up pain medication.   -7/22 placed on telemetry and continuous pulse ox monitor  Given patient has ESRD will need to be mindful of buildup of narcotic medication. -7/23 Tylenol 1000 mg TID -7/24 counseled patient that if we can get her blood pressure increased and  stabilized will attempt to increase her pain medication but currently unable to. -7/26 Fentanyl IV 25 mcg PRN -7/26 Tramadol 50 mg QID -7/26 increase Gabapentin 300 mg TID  Right knee abrasion -Minimal laceration  ESRD on HD M/W/F -BUN/creatinine  65/9.26 (no recent labs for comparison with last lab being 5 years ago at 9.66).  -7/23 patient should be dialyzed    Hypotension -7/25 Albumin 50 g x 1 -7/25 Midodrine 10 mg BID  Hyperkalemia  -Resolved  Macrocytic anemia? -H/H 9.8/32 MCV 105.3 -Anemia panel consistent with acute inflammatory process such as her acute fracture.  Osteopenia? -Vitamin D, 25-hydroxy WNL  -Calcium was normal at 9.3  DM type II uncontrolled with complication -3/22 hemoglobin A1c= 7.1 -NPH 36 units q breakfast -NPH 20 units q bedtime -Moderate SSI  Hx pulmonary embolism -Patient has history of PE  -Restarted Eliquis.     Asthma -  Obesity, class III (BMI 47.42) -This was determined by height 5\' 11"  and weight 154.2 kg -Per EMR patient was counseled about diet and exercise when more stable.  However given that patient will be mostly bedbound this is unrealistic  Obstructive sleep apnea not on CPAP -CPAP per respiratory  Constipation -MiraLAX 17 g x 1   Painful skin lesion left flank (Necrobiosis Lipoidica) Clobetasol 0.05% BID Apply to lateral thoracic area under pannus and place occlusive dressing over top.  Right flank skin laceration -On inspection of area that patient pointed out where sutures have been removed there was an old Band-Aid when removed frank pus emanated. -Notified RN who observed area and flushed area will begin daily wet-to-dry dressing change    DVT prophylaxis: Eliquis Code Status: Full Family Communication: 7/23 Sister at bedside for discussion of plan of care answered all questions Status is: Inpatient    Dispo: The patient is from: Home              Anticipated d/c is to: Long-term care facility              Anticipated d/c date is: 8/1              Patient currently unstable      Consultants:  Orthopedic surgery  Procedures/Significant Events:    I have personally reviewed and interpreted all radiology studies and my findings are as  above.  VENTILATOR SETTINGS:    Cultures   Antimicrobials:    Devices    LINES / TUBES:      Continuous Infusions: . [START ON 03/06/2020] sodium chloride    . [START ON 03/06/2020] sodium chloride       Objective: Vitals:   03/05/20 1402 03/05/20 1432 03/05/20 1500 03/05/20 1530  BP: (!) 110/23 (!) 101/31 (!) 88/31 (!) 138/101  Pulse: 61 66 64 77  Resp:      Temp:      TempSrc:      SpO2:      Weight:      Height:        Intake/Output Summary (Last 24 hours) at 03/05/2020 1538 Last data filed at 03/05/2020 0900 Gross per 24 hour  Intake 600 ml  Output --  Net 600 ml   Filed Weights   02/29/20 0012 02/29/20 1916 03/05/20 1348  Weight: (!) 154.2 kg (!) 165.6 kg (!) 170.5 kg    Physical Exam:  General: A/O x4 No acute respiratory distress Eyes: negative scleral hemorrhage, negative anisocoria, negative icterus ENT: Negative Runny nose, negative gingival bleeding, Neck:  Negative scars, masses,  torticollis, lymphadenopathy, JVD Lungs: Clear to auscultation bilaterally without wheezes or crackles Cardiovascular: Regular rate and rhythm without murmur gallop or rub normal S1 and S2 Abdomen: MORBIDLY OBESE abdominal pain, nondistended, positive soft, bowel sounds, no rebound, no ascites, no appreciable mass Extremities: RIGHT lower extremity in splint and brace elevated at 40 degrees Skin: Necrobiosis Lipoidica under pannus left side, surgical laceration under pannus right side Psychiatric:  Negative depression, negative anxiety, negative fatigue, negative mania  Central nervous system:  Cranial nerves II through XII intact, tongue/uvula midline, all extremities muscle strength 5/5, sensation intact throughout, negative dysarthria, negative expressive aphasia, negative receptive aphasia. .  .     Data Reviewed: Care during the described time interval was provided by me .  I have reviewed this patient's available data, including medical history, events of  note, physical examination, and all test results as part of my evaluation.  CBC: Recent Labs  Lab 02/28/20 2138 02/29/20 0931 03/01/20 0305 03/02/20 0459 03/05/20 0631  WBC 10.0 7.9 7.7 7.6 9.9  NEUTROABS 8.4* 6.2 6.1 5.7  --   HGB 9.8* 8.8* 8.4* 8.7* 8.3*  HCT 32.0* 29.8* 28.0* 28.4* 26.9*  MCV 105.3* 103.8* 104.1* 102.5* 101.5*  PLT 148* 129* 120* 153 938*   Basic Metabolic Panel: Recent Labs  Lab 03/01/20 0305 03/02/20 0459 03/03/20 0238 03/04/20 0235 03/05/20 0631  NA 133* 134* 133* 131* 130*  K 4.3 4.2 4.1 3.7 4.3  CL 94* 94* 94* 93* 92*  CO2 21* 25 27 26 25   GLUCOSE 240* 102* 174* 142* 135*  BUN 35* 50* 29* 42* 58*  CREATININE 6.24* 8.42* 5.86* 7.08* 9.24*  CALCIUM 8.5* 9.2 8.9 8.7* 9.0  MG 2.0 2.3 2.1 2.1 2.6*  PHOS 5.4* 7.0* 5.4* 6.2* 7.3*   GFR: Estimated Creatinine Clearance: 9.9 mL/min (A) (by C-G formula based on SCr of 9.24 mg/dL (H)). Liver Function Tests: Recent Labs  Lab 03/01/20 0305 03/02/20 0459 03/03/20 0238 03/04/20 0235 03/05/20 0631  AST 17 13* 23 13* 14*  ALT 14 19 16 17 17   ALKPHOS 90 75 80 84 90  BILITOT 0.8 0.8 0.6 0.5 0.3  PROT 7.1 7.3 6.6 6.7 7.2  ALBUMIN 2.8* 2.8* 2.5* 2.6* 3.1*   No results for input(s): LIPASE, AMYLASE in the last 168 hours. No results for input(s): AMMONIA in the last 168 hours. Coagulation Profile: Recent Labs  Lab 02/29/20 0210 02/29/20 0931  INR 1.4* 1.6*   Cardiac Enzymes: No results for input(s): CKTOTAL, CKMB, CKMBINDEX, TROPONINI in the last 168 hours. BNP (last 3 results) No results for input(s): PROBNP in the last 8760 hours. HbA1C: No results for input(s): HGBA1C in the last 72 hours. CBG: Recent Labs  Lab 03/04/20 1559 03/04/20 1602 03/04/20 2108 03/05/20 0907 03/05/20 1128  GLUCAP 210* 220* 225* 175* 170*   Lipid Profile: No results for input(s): CHOL, HDL, LDLCALC, TRIG, CHOLHDL, LDLDIRECT in the last 72 hours. Thyroid Function Tests: No results for input(s): TSH, T4TOTAL,  FREET4, T3FREE, THYROIDAB in the last 72 hours. Anemia Panel: No results for input(s): VITAMINB12, FOLATE, FERRITIN, TIBC, IRON, RETICCTPCT in the last 72 hours. Sepsis Labs: No results for input(s): PROCALCITON, LATICACIDVEN in the last 168 hours.  Recent Results (from the past 240 hour(s))  SARS Coronavirus 2 by RT PCR (hospital order, performed in [facility] hospital lab) Nasopharyngeal Nasopharyngeal Swab     Status: None   Collection Time: 02/28/20 11:23 PM   Specimen: Nasopharyngeal Swab  Result Value Ref Range Status  SARS Coronavirus 2 NEGATIVE NEGATIVE Final    Comment: (NOTE) SARS-CoV-2 target nucleic acids are NOT DETECTED.  The SARS-CoV-2 RNA is generally detectable in upper and lower respiratory specimens during the acute phase of infection. The lowest concentration of SARS-CoV-2 viral copies this assay can detect is 250 copies / mL. A negative result does not preclude SARS-CoV-2 infection and should not be used as the sole basis for treatment or other patient management decisions.  A negative result may occur with improper specimen collection / handling, submission of specimen other than nasopharyngeal swab, presence of viral mutation(s) within the areas targeted by this assay, and inadequate number of viral copies (<250 copies / mL). A negative result must be combined with clinical observations, patient history, and epidemiological information.  Fact Sheet for Patients:   StrictlyIdeas.no  Fact Sheet for Healthcare Providers: BankingDealers.co.za  This test is not yet approved or  cleared by the [place] FDA and has been authorized for detection and/or diagnosis of SARS-CoV-2 by FDA under an Emergency Use Authorization (EUA).  This EUA will remain in effect (meaning this test can be used) for the duration of the COVID-19 declaration under Section 564(b)(1) of the Act, 21 U.S.C. section 360bbb-3(b)(1), unless the  authorization is terminated or revoked sooner.  Performed at [facility], [place] [address], [place], [place] [address]          Radiology Studies: No results found.      Scheduled Meds: . acetaminophen  1,000 mg Oral Q8H  . apixaban  5 mg Oral BID  . calcium carbonate  1,250 mg Oral Q breakfast  . carvedilol  12.5 mg Oral BID WC  . Chlorhexidine Gluconate Cloth  6 each Topical Q0600  . doxercalciferol  2 mcg Intravenous Q M,W,F-HD  . gabapentin  200 mg Oral TID  . insulin aspart  0-15 Units Subcutaneous TID WC  . insulin aspart  0-5 Units Subcutaneous QHS  . insulin NPH Human  20 Units Subcutaneous QHS  . insulin NPH Human  36 Units Subcutaneous QAC breakfast  . midodrine  10 mg Oral BID WC  . montelukast  10 mg Oral QHS  . sevelamer carbonate  800 mg Oral TID WC  . traMADol  50 mg Oral Q6H   Continuous Infusions: . [START ON 03/06/2020] sodium chloride    . [START ON 03/06/2020] sodium chloride       LOS: 6 days    Time spent:40 min    Anneka Studer, Geraldo Docker, MD [facility] Pager [phone]  If 7PM-7AM, please contact night-coverage [email or web address] Password [facility] 03/05/2020, 3:38 PM

## 2020-03-05 NOTE — Procedures (Signed)
Patient seen on Hemodialysis. BP (!) 96/58 (BP Location: Right Arm)   Pulse 67   Temp (!) 97.5 F (36.4 C) (Oral)   Resp 17   Ht 5\' 11"  (1.803 m)   Wt (!) 165.6 kg   LMP  (LMP Unknown)   SpO2 99%   BMI 50.92 kg/m   QB 400, UF goal 3L Tolerating treatment without complaints at this time.  Elmarie Shiley MD [facility]. Office # [phone]  2:36 PM

## 2020-03-05 NOTE — Progress Notes (Signed)
[facility] Progress Note   Subjective:   Seen and examined at bedside.  Continues to have pain in RLE.  Admits to SOB, improved with O2.  Denies n/v/d, abdominal pain and CP.   Objective Vitals:   03/04/20 1300 03/04/20 1946 03/05/20 0449 03/05/20 0736  BP: (!) 88/61 (!) 96/42 (!) 95/34 (!) 96/58  Pulse: 60 65 63 67  Resp: 14 18 16 17   Temp: 98.4 F (36.9 C) 98.3 F (36.8 C) (!) 97.5 F (36.4 C) (!) 97.5 F (36.4 C)  TempSrc: Oral Oral Oral Oral  SpO2: 100% 100% 100% 99%  Weight:      Height:       Physical Exam General:chronically ill appearing, obese female in NAD Heart:RRR Lungs:CTAB anterolaterally, nml WOB on 4L  Abdomen:soft, NTND Extremities:RLE in brace, trace edema b/l Dialysis Access: LU AVG +b   Filed Weights   02/29/20 0012 02/29/20 1916  Weight: (!) 154.2 kg (!) 165.6 kg    Intake/Output Summary (Last 24 hours) at 03/05/2020 0917 Last data filed at 03/04/2020 1700 Gross per 24 hour  Intake 240 ml  Output --  Net 240 ml    Additional Objective Labs: Basic Metabolic Panel: Recent Labs  Lab 03/03/20 0238 03/04/20 0235 03/05/20 0631  NA 133* 131* 130*  K 4.1 3.7 4.3  CL 94* 93* 92*  CO2 27 26 25   GLUCOSE 174* 142* 135*  BUN 29* 42* 58*  CREATININE 5.86* 7.08* 9.24*  CALCIUM 8.9 8.7* 9.0  PHOS 5.4* 6.2* 7.3*   Liver Function Tests: Recent Labs  Lab 03/03/20 0238 03/04/20 0235 03/05/20 0631  AST 23 13* 14*  ALT 16 17 17   ALKPHOS 80 84 90  BILITOT 0.6 0.5 0.3  PROT 6.6 6.7 7.2  ALBUMIN 2.5* 2.6* 3.1*   CBC: Recent Labs  Lab 02/28/20 2138 02/28/20 2138 02/29/20 0931 02/29/20 0931 03/01/20 0305 03/02/20 0459 03/05/20 0631  WBC 10.0   < > 7.9   < > 7.7 7.6 9.9  NEUTROABS 8.4*   < > 6.2  --  6.1 5.7  --   HGB 9.8*   < > 8.8*   < > 8.4* 8.7* 8.3*  HCT 32.0*   < > 29.8*   < > 28.0* 28.4* 26.9*  MCV 105.3*  --  103.8*  --  104.1* 102.5* 101.5*  PLT 148*   < > 129*   < > 120* 153 116*   < > = values in this  interval not displayed.   CBG: Recent Labs  Lab 03/04/20 1204 03/04/20 1559 03/04/20 1602 03/04/20 2108 03/05/20 0907  GLUCAP 207* 210* 220* 225* 175*   Studies/Results: No results found.  Medications:  . acetaminophen  1,000 mg Oral Q8H  . apixaban  5 mg Oral BID  . calcium carbonate  1,250 mg Oral Q breakfast  . carvedilol  12.5 mg Oral BID WC  . Chlorhexidine Gluconate Cloth  6 each Topical Q0600  . doxercalciferol  2 mcg Intravenous Q M,W,F-HD  . gabapentin  200 mg Oral TID  . insulin aspart  0-15 Units Subcutaneous TID WC  . insulin aspart  0-5 Units Subcutaneous QHS  . insulin NPH Human  20 Units Subcutaneous QHS  . insulin NPH Human  36 Units Subcutaneous QAC breakfast  . midodrine  10 mg Oral BID WC  . montelukast  10 mg Oral QHS  . sevelamer carbonate  800 mg Oral TID WC    Dialysis Orders: AF MWF 4 hours and  15 minutes  BF 500, DF 800  EDW 156 kg  2K /2.25 Ca  AV graft  Not on heparin  Note that Coumadin is not on her outpatient medication list  Meds: mircera 150 mcg every 2 weeks; last given on 7/19  hectorol 2 mcg three times a week  On 7/19 left at 165.9 kg and on 7/20 left at 161.5 kg  Assessment/Plan: 1. R tibial Frx- per ortho.  Plans for conservative management. Primary requesting re-eval for possible surgery.  Pain control per primary.  2. ESRD - on MWF.  HD today per regular schedule. K 4.3 3. Anemia of CKD- Hgb 8.3. Recent ESA dose. Tsat 22%, Ferratin >7500 likely d/t inflammatory response.  4. Secondary hyperparathyroidism - Ca at goal. phos elevated.  Continue VDRA and binders.  5. Hypotension/volume - chronic hypotension, on midodrine.  10kg over edw per bed weights, unsure if they are correct.  Does not appear volume overloaded.  UF as tolerated.  6. Nutrition - Renal diet w/fluid restrictions.  7. DM - per primary 8. Dispo - plans to d/c to SNF  Jen Mow, PA-C [place] 03/05/2020,9:17 AM  LOS: 6 days

## 2020-03-05 NOTE — Progress Notes (Signed)
   Subjective:    Patient reports pain as mild. Says she has pain but has problems focusing eyes and falls asleep while we are talking.    Objective: Vital signs in last 24 hours: Temp:  [97.5 F (36.4 C)-98.4 F (36.9 C)] 97.5 F (36.4 C) (07/26 0736) Pulse Rate:  [60-67] 67 (07/26 0736) Resp:  [14-18] 17 (07/26 0736) BP: (88-96)/(34-61) 96/58 (07/26 0736) SpO2:  [99 %-100 %] 99 % (07/26 0736)  Intake/Output from previous day: 07/25 0701 - 07/26 0700 In: 240 [P.O.:240] Out: -  Intake/Output this shift: No intake/output data recorded.  Recent Labs    03/05/20 0631  HGB 8.3*   Recent Labs    03/05/20 0631  WBC 9.9  RBC 2.65*  HCT 26.9*  PLT PENDING   Recent Labs    03/04/20 0235 03/05/20 0631  NA 131* 130*  K 3.7 4.3  CL 93* 92*  CO2 26 25  BUN 42* 58*  CREATININE 7.08* 9.24*  GLUCOSE 142* 135*  CALCIUM 8.7* 9.0   No results for input(s): LABPT, INR in the last 72 hours.  Compartment soft moves toes on command. leg soft.  No results found.  Assessment/Plan:    Plan;  SNF .   Suzanne Levy 03/05/2020, 8:51 AM

## 2020-03-06 DIAGNOSIS — S82231B Displaced oblique fracture of shaft of right tibia, initial encounter for open fracture type I or II: Secondary | ICD-10-CM | POA: Diagnosis not present

## 2020-03-06 DIAGNOSIS — S82401A Unspecified fracture of shaft of right fibula, initial encounter for closed fracture: Secondary | ICD-10-CM | POA: Diagnosis not present

## 2020-03-06 DIAGNOSIS — S80211D Abrasion, right knee, subsequent encounter: Secondary | ICD-10-CM | POA: Diagnosis not present

## 2020-03-06 DIAGNOSIS — J4520 Mild intermittent asthma, uncomplicated: Secondary | ICD-10-CM | POA: Diagnosis not present

## 2020-03-06 LAB — CBC
HCT: 26.5 % — ABNORMAL LOW (ref 36.0–46.0)
Hemoglobin: 8 g/dL — ABNORMAL LOW (ref 12.0–15.0)
MCH: 31.1 pg (ref 26.0–34.0)
MCHC: 30.2 g/dL (ref 30.0–36.0)
MCV: 103.1 fL — ABNORMAL HIGH (ref 80.0–100.0)
Platelets: 114 10*3/uL — ABNORMAL LOW (ref 150–400)
RBC: 2.57 MIL/uL — ABNORMAL LOW (ref 3.87–5.11)
RDW: 17.8 % — ABNORMAL HIGH (ref 11.5–15.5)
WBC: 8.7 10*3/uL (ref 4.0–10.5)
nRBC: 0 % (ref 0.0–0.2)

## 2020-03-06 LAB — GLUCOSE, CAPILLARY
Glucose-Capillary: 103 mg/dL — ABNORMAL HIGH (ref 70–99)
Glucose-Capillary: 171 mg/dL — ABNORMAL HIGH (ref 70–99)
Glucose-Capillary: 196 mg/dL — ABNORMAL HIGH (ref 70–99)
Glucose-Capillary: 205 mg/dL — ABNORMAL HIGH (ref 70–99)

## 2020-03-06 LAB — MAGNESIUM: Magnesium: 2 mg/dL (ref 1.7–2.4)

## 2020-03-06 LAB — COMPREHENSIVE METABOLIC PANEL
ALT: 18 U/L (ref 0–44)
AST: 14 U/L — ABNORMAL LOW (ref 15–41)
Albumin: 2.9 g/dL — ABNORMAL LOW (ref 3.5–5.0)
Alkaline Phosphatase: 92 U/L (ref 38–126)
Anion gap: 12 (ref 5–15)
BUN: 32 mg/dL — ABNORMAL HIGH (ref 8–23)
CO2: 26 mmol/L (ref 22–32)
Calcium: 8.7 mg/dL — ABNORMAL LOW (ref 8.9–10.3)
Chloride: 95 mmol/L — ABNORMAL LOW (ref 98–111)
Creatinine, Ser: 5.59 mg/dL — ABNORMAL HIGH (ref 0.44–1.00)
GFR calc Af Amer: 8 mL/min — ABNORMAL LOW (ref >60)
GFR calc non Af Amer: 7 mL/min — ABNORMAL LOW (ref >60)
Glucose, Bld: 154 mg/dL — ABNORMAL HIGH (ref 70–99)
Potassium: 4 mmol/L (ref 3.5–5.1)
Sodium: 133 mmol/L — ABNORMAL LOW (ref 135–145)
Total Bilirubin: 0.6 mg/dL (ref 0.3–1.2)
Total Protein: 6.8 g/dL (ref 6.5–8.1)

## 2020-03-06 LAB — PHOSPHORUS: Phosphorus: 4.7 mg/dL — ABNORMAL HIGH (ref 2.5–4.6)

## 2020-03-06 NOTE — Progress Notes (Signed)
Placed patient on CPAP for the night via auto-mode with oxygen set at 4lpm.  

## 2020-03-06 NOTE — Progress Notes (Signed)
Occupational Therapy Treatment Patient Details Name: Suzanne Levy MRN: [id number] DOB: 1950/08/12 Today's Date: 03/06/2020    History of present illness Pt is a 69 y.o. F with significant PMH of ESRD on HD, DM2, PE, morbid obesity, asthma, hypertension, osteopneia, who presents to ED with right knee pain after striking it against a railing. Admitted with acute oblique slightly comminuted and slightly displaced fracture extending through the proximal tibial shaft. Plan for conservative treatment.   OT comments  Pt could benefit from sizewise bed that has extensions to allow for better long rolling to allow patient more comfort. Pt does requires air mattress for skin. Pt requires total +2 total (A) for peri care. Pt at baseline requires hoyer and power chair.    Follow Up Recommendations  SNF    Equipment Recommendations  Wheelchair (measurements OT);Wheelchair cushion (measurements OT) (R LE elevated leg rest)    Recommendations for Other Services      Precautions / Restrictions Precautions Precautions: Fall;Other (comment) Precaution Comments: legally blind, R hinge brace Required Braces or Orthoses: Other Brace Other Brace: hinge brace Restrictions Weight Bearing Restrictions: Yes RLE Weight Bearing: Non weight bearing       Mobility Bed Mobility Overal bed mobility: Needs Assistance Bed Mobility: Rolling Rolling: +2 for physical assistance;Total assist         General bed mobility comments: Pt remains unable to roll completely into sidelying.  Atempted x 2 to the R and x1 to the L.  Pt lacks physical capacity to participate to decrease caregiver burden.  Transfers                      Balance                                           ADL either performed or assessed with clinical judgement   ADL Overall ADL's : Needs assistance/impaired Eating/Feeding: Set up;Bed level Eating/Feeding Details (indicate cue type and reason): pt requires  verbal cues to where items are located on tray and able to self feed if setup. pt reports "it was cold because i have tried to get someone in here for an hour" Ot removing two onions from patietns chest. pt states "that it? i normally have food all over my neck. i call it my snack for later" Grooming: Wash/dry hands;Set up   Upper Body Bathing: Maximal assistance   Lower Body Bathing: Total assistance   Upper Body Dressing : Maximal assistance   Lower Body Dressing: Total assistance                 General ADL Comments: after brace adjustment finished focused on bed mobility to help maximize pt ability and decrease burden of care for bed level. pt will require bed level care with bed pan. pt is unable to roll L . Pt requires extensive effort to position on R side due to body habitus.      Vision       Perception     Praxis      Cognition Arousal/Alertness: Awake/alert Behavior During Therapy: WFL for tasks assessed/performed Overall Cognitive Status: Within Functional Limits for tasks assessed  Exercises Exercises: Other exercises General Exercises - Lower Extremity Ankle Circles/Pumps: AROM;Left;10 reps;Supine Quad Sets: AROM;Left;10 reps;Supine Heel Slides: AAROM;Left;10 reps;Supine Other Exercises Other Exercises: shoulder flexion x10 reps   Shoulder Instructions       General Comments pt reports at baseline hoyer lift required. Pt will need to advocate for positioning of R LE in lift to properly transfer.     Pertinent Vitals/ Pain       Pain Assessment: Faces Faces Pain Scale: Hurts a little bit Pain Location: R LE with movement Pain Descriptors / Indicators: Aching;Discomfort;Grimacing;Guarding Pain Intervention(s): Premedicated before session;Monitored during session;Repositioned  Home Living                                          Prior Functioning/Environment               Frequency  Min 2X/week        Progress Toward Goals  OT Goals(current goals can now be found in the care plan section)  Progress towards OT goals: Progressing toward goals  Acute Rehab OT Goals Patient Stated Goal: to have less pain OT Goal Formulation: With patient Time For Goal Achievement: 03/14/20 Potential to Achieve Goals: Good ADL Goals Pt Will Perform Grooming: with modified independence;bed level Pt Will Perform Upper Body Bathing: with min assist;bed level Pt Will Perform Upper Body Dressing: with min assist;bed level Pt/caregiver will Perform Home Exercise Program: Both right and left upper extremity;Increased ROM;Increased strength Additional ADL Goal #1: Pt. to be Min A with rolling to assist with ADLs.  Plan Discharge plan remains appropriate    Co-evaluation    PT/OT/SLP Co-Evaluation/Treatment: Yes Reason for Co-Treatment: Necessary to address cognition/behavior during functional activity;For patient/therapist safety;To address functional/ADL transfers   OT goals addressed during session: Strengthening/ROM      AM-PAC OT "6 Clicks" Daily Activity     Outcome Measure   Help from another person eating meals?: A Little Help from another person taking care of personal grooming?: A Lot Help from another person toileting, which includes using toliet, bedpan, or urinal?: Total Help from another person bathing (including washing, rinsing, drying)?: Total Help from another person to put on and taking off regular upper body clothing?: Total Help from another person to put on and taking off regular lower body clothing?: Total 6 Click Score: 9    End of Session Equipment Utilized During Treatment: Oxygen (5L)  OT Visit Diagnosis: Muscle weakness (generalized) (M62.81);Low vision, both eyes (H54.2);Pain   Activity Tolerance Patient tolerated treatment well   Patient Left in bed;with call bell/phone within reach;with bed alarm set   Nurse Communication  Patient requests pain meds        Time: 1610-9604 OT Time Calculation (min): 34 min  Charges: OT General Charges $OT Visit: 1 Visit OT Treatments $Therapeutic Activity: 8-22 mins   Brynn, OTR/L  Acute Rehabilitation Services Pager: [phone] Office: [phone] .    Jeri Modena 03/06/2020, 4:41 PM

## 2020-03-06 NOTE — Plan of Care (Signed)
  Problem: Education: Goal: Knowledge of General Education information will improve Description: Including pain rating scale, medication(s)/side effects and non-pharmacologic comfort measures Outcome: Progressing   Problem: Nutrition: Goal: Adequate nutrition will be maintained Outcome: Progressing   Problem: Coping: Goal: Level of anxiety will decrease Outcome: Progressing   Problem: Elimination: Goal: Will not experience complications related to bowel motility Outcome: Progressing Note: BM this shift   Problem: Safety: Goal: Ability to remain free from injury will improve Outcome: Progressing

## 2020-03-06 NOTE — Progress Notes (Signed)
[facility] Progress Note   Subjective:   Patient seen and examined at bedside.  States pain is well controlled today.  Denies SOB, CP, n/v/d, and abdominal pain.  Reports constipation, given miralax yesterday with no BM as of yet.     Objective Vitals:   03/05/20 1730 03/05/20 1800 03/05/20 1820 03/05/20 2100  BP: (!) 99/47 (!) 98/25 (!) 95/28 (!) 109/55  Pulse: 68 69 68 66  Resp:   18 17  Temp:    98.4 F (36.9 C)  TempSrc:    Oral  SpO2:   100% 98%  Weight:   (!) 166.5 kg   Height:       Physical Exam General:WD, obese female in NAD Heart:RRR Lungs:CTAB anterolaterally, nml WOB on 4L via [place]  Abdomen:soft, NTND Extremities:no LE edema, R leg in brace Dialysis Access: LU AVG +b   Filed Weights   02/29/20 1916 03/05/20 1348 03/05/20 1820  Weight: (!) 165.6 kg (!) 170.5 kg (!) 166.5 kg    Intake/Output Summary (Last 24 hours) at 03/06/2020 0930 Last data filed at 03/05/2020 1820 Gross per 24 hour  Intake --  Output 3000 ml  Net -3000 ml    Additional Objective Labs: Basic Metabolic Panel: Recent Labs  Lab 03/04/20 0235 03/05/20 0631 03/06/20 0320  NA 131* 130* 133*  K 3.7 4.3 4.0  CL 93* 92* 95*  CO2 26 25 26   GLUCOSE 142* 135* 154*  BUN 42* 58* 32*  CREATININE 7.08* 9.24* 5.59*  CALCIUM 8.7* 9.0 8.7*  PHOS 6.2* 7.3* 4.7*   Liver Function Tests: Recent Labs  Lab 03/04/20 0235 03/05/20 0631 03/06/20 0320  AST 13* 14* 14*  ALT 17 17 18   ALKPHOS 84 90 92  BILITOT 0.5 0.3 0.6  PROT 6.7 7.2 6.8  ALBUMIN 2.6* 3.1* 2.9*   No results for input(s): LIPASE, AMYLASE in the last 168 hours. CBC: Recent Labs  Lab 02/29/20 0931 02/29/20 0931 03/01/20 0305 03/01/20 0305 03/02/20 0459 03/05/20 0631 03/06/20 0320  WBC 7.9   < > 7.7   < > 7.6 9.9 8.7  NEUTROABS 6.2  --  6.1  --  5.7  --   --   HGB 8.8*   < > 8.4*   < > 8.7* 8.3* 8.0*  HCT 29.8*   < > 28.0*   < > 28.4* 26.9* 26.5*  MCV 103.8*  --  104.1*  --  102.5* 101.5* 103.1*  PLT  129*   < > 120*   < > 153 116* 114*   < > = values in this interval not displayed.   CBG: Recent Labs  Lab 03/05/20 0604 03/05/20 0907 03/05/20 1128 03/05/20 2128 03/06/20 0655  GLUCAP 139* 175* 170* 196* 103*   Medications:   acetaminophen  1,000 mg Oral Q8H   apixaban  5 mg Oral BID   calcium carbonate  1,250 mg Oral Q breakfast   carvedilol  12.5 mg Oral BID WC   Chlorhexidine Gluconate Cloth  6 each Topical Q0600   clobetasol ointment  1 application Topical BID   doxercalciferol  2 mcg Intravenous Q M,W,F-HD   gabapentin  300 mg Oral TID   insulin aspart  0-15 Units Subcutaneous TID WC   insulin aspart  0-5 Units Subcutaneous QHS   insulin NPH Human  20 Units Subcutaneous QHS   insulin NPH Human  36 Units Subcutaneous QAC breakfast   midodrine  10 mg Oral BID WC   montelukast  10 mg  Oral QHS   sevelamer carbonate  800 mg Oral TID WC   traMADol  50 mg Oral Q6H    Dialysis Orders: AF MWF 4 hours and 15 minutes  BF 500, DF 800  EDW 156 kg  2K /2.25 Ca  AV graft  Not on heparin  Note that Coumadin is not on her outpatient medication list  Meds: mircera 150 mcg every 2 weeks; last given on 7/19  hectorol 2 mcg three times a week  On 7/19 left at 165.9 kg and on 7/20 left at 161.5 kg  Assessment/Plan: 1. R tibial Frx- per ortho.  Plans for conservative management.  Pain control per primary.  2. ESRD - on MWF.  HD tomorrow per regular schedule. K 4.0. 3. Anemia of CKD- Hgb 8.0. Recent ESA dose. Tsat 22%, Ferratin >7500 likely d/t inflammatory response.  4. Secondary hyperparathyroidism - Ca and phos in goal.  Continue VDRA and binders.  5. Hypotension/volume - chronic hypotension, on midodrine.  10kg over edw per bed weights, unsure if they are correct.  Does not appear grossly volume overloaded but continues to be hypoxic requiring O2.  Attempt UF goal 4-5L tomorrow.  UF limited by hypotension. 6. Nutrition - Renal diet w/fluid restrictions.  7. DM  - per primary 8. Dispo - plans to d/c to SNF.  SW providing list of SNF in the area for patient to chose from.  9. Hx PE - on eliquis 10. OSA - on CPAP  Jen Mow, PA-C [facility] 03/06/2020,9:30 AM  LOS: 7 days

## 2020-03-06 NOTE — Progress Notes (Signed)
Physical Therapy Treatment Patient Details Name: Suzanne Levy MRN: [id number] DOB: 02-28-1951 Today's Date: 03/06/2020    History of Present Illness Pt is a 69 y.o. F with significant PMH of ESRD on HD, DM2, PE, morbid obesity, asthma, hypertension, osteopneia, who presents to ED with right knee pain after striking it against a railing. Admitted with acute oblique slightly comminuted and slightly displaced fracture extending through the proximal tibial shaft. Plan for conservative treatment.    PT Comments    Pt supine in bed finishing lunch.  Pt required assistance to roll to R and L sides with total +2 and remains a maximove lift for any OOB transfers.  PTA and OT readjusted bledsoe as it had slid down and was causing impressions on her inner thigh.  Continue to recommend snf placement at d/c.     Follow Up Recommendations  SNF     Equipment Recommendations  None recommended by PT    Recommendations for Other Services       Precautions / Restrictions Precautions Precautions: Fall;Other (comment) Precaution Comments: Legally blind Restrictions Weight Bearing Restrictions: Yes RLE Weight Bearing: Non weight bearing    Mobility  Bed Mobility Overal bed mobility: Needs Assistance Bed Mobility: Rolling Rolling: +2 for physical assistance;Total assist         General bed mobility comments: Pt remains unable to roll completely into sidelying.  Atempted x 2 to the R and x1 to the L.  Pt lacks physical capacity to participate to decrease caregiver burden.  Transfers                    Ambulation/Gait                 Stairs             Wheelchair Mobility    Modified Rankin (Stroke Patients Only)       Balance                                            Cognition Arousal/Alertness: Awake/alert Behavior During Therapy: WFL for tasks assessed/performed Overall Cognitive Status: Within Functional Limits for tasks assessed                                         Exercises General Exercises - Lower Extremity Ankle Circles/Pumps: AROM;Left;10 reps;Supine Quad Sets: AROM;Left;10 reps;Supine Heel Slides: AAROM;Left;10 reps;Supine    General Comments        Pertinent Vitals/Pain Pain Assessment: Faces Faces Pain Scale: Hurts whole lot Pain Location: RLE with movement and adjustment of brace. Pain Descriptors / Indicators: Aching;Discomfort;Grimacing;Guarding Pain Intervention(s): Monitored during session;Repositioned    Home Living                      Prior Function            PT Goals (current goals can now be found in the care plan section) Acute Rehab PT Goals Patient Stated Goal: to have less pain Potential to Achieve Goals: Fair Progress towards PT goals: Progressing toward goals    Frequency    Min 2X/week      PT Plan Current plan remains appropriate    Co-evaluation PT/OT/SLP Co-Evaluation/Treatment: Yes Reason for Co-Treatment: For patient/therapist safety;Complexity of the patient's  impairments (multi-system involvement)          AM-PAC PT "6 Clicks" Mobility   Outcome Measure  Help needed turning from your back to your side while in a flat bed without using bedrails?: Total Help needed moving from lying on your back to sitting on the side of a flat bed without using bedrails?: Total Help needed moving to and from a bed to a chair (including a wheelchair)?: Total Help needed standing up from a chair using your arms (e.g., wheelchair or bedside chair)?: Total Help needed to walk in hospital room?: Total Help needed climbing 3-5 steps with a railing? : Total 6 Click Score: 6    End of Session Equipment Utilized During Treatment: Gait belt Activity Tolerance: Patient limited by pain Patient left: in bed;with call bell/phone within reach;with bed alarm set Nurse Communication: Mobility status PT Visit Diagnosis: Pain;Other abnormalities of gait and  mobility (R26.89) Pain - Right/Left: Right Pain - part of body: Leg     Time: 1450-1527 PT Time Calculation (min) (ACUTE ONLY): 37 min  Charges:  $Therapeutic Activity: 8-22 mins                     Erasmo Leventhal , PTA Acute Rehabilitation Services Pager [phone] Office [phone]     Ninette Cotta [name] 03/06/2020, 3:58 PM

## 2020-03-06 NOTE — Progress Notes (Signed)
Patient with SEVERE osteopenia, previous prox tibia fracture with retained screw, severe knee arthritis with knee valgus and tib/fib fracture . Surgery will not allow her to weight bear on her leg any faster. Bone quality very poor. Her fracture will heal with conservative treatment and she should be able to transfer again as she did before her current injury. I will follow her in office post discharge.

## 2020-03-06 NOTE — TOC Progression Note (Signed)
Transition of Care [facility]) - Progression Note    Patient Details  Name: Suzanne Levy MRN: [id number] Date of Birth: [birth year]  Transition of Care [facility]) CM/SW Contact  Curlene Labrum, RN Phone Number: 03/06/2020, 9:58 AM  Clinical Narrative:    Case management met with the patient this morning to given Medicare choice regarding skilled nursing rehab - Patient given Medicare choice and patient would like to consider SNF placement - most probably [facility] care center in [place] follow up with patient regarding choice once patient is stable to transfer.  Patient has COVID vaccine up to date.   Expected Discharge Plan: [place] Barriers to Discharge: Continued Medical Work up  Expected Discharge Plan and Services Expected Discharge Plan: [place]   Discharge Planning Services: CM Consult Post Acute Care Choice: [place] Living arrangements for the past 2 months: Apartment                                       Social Determinants of Health (SDOH) Interventions    Readmission Risk Interventions Readmission Risk Prevention Plan 03/01/2020  Transportation Screening Complete  Medication Review [employer]) Complete  PCP or Specialist appointment within 3-5 days of discharge Complete  HRI or Home Care Consult Complete  SW Recovery Care/Counseling Consult Complete  Palliative Care Screening Complete  Skilled Nursing Facility Complete  Some recent data might be hidden

## 2020-03-07 DIAGNOSIS — S82191A Other fracture of upper end of right tibia, initial encounter for closed fracture: Secondary | ICD-10-CM | POA: Diagnosis not present

## 2020-03-07 LAB — COMPREHENSIVE METABOLIC PANEL
ALT: 19 U/L (ref 0–44)
AST: 15 U/L (ref 15–41)
Albumin: 2.9 g/dL — ABNORMAL LOW (ref 3.5–5.0)
Alkaline Phosphatase: 105 U/L (ref 38–126)
Anion gap: 12 (ref 5–15)
BUN: 49 mg/dL — ABNORMAL HIGH (ref 8–23)
CO2: 27 mmol/L (ref 22–32)
Calcium: 9.3 mg/dL (ref 8.9–10.3)
Chloride: 92 mmol/L — ABNORMAL LOW (ref 98–111)
Creatinine, Ser: 7.4 mg/dL — ABNORMAL HIGH (ref 0.44–1.00)
GFR calc Af Amer: 6 mL/min — ABNORMAL LOW (ref >60)
GFR calc non Af Amer: 5 mL/min — ABNORMAL LOW (ref >60)
Glucose, Bld: 135 mg/dL — ABNORMAL HIGH (ref 70–99)
Potassium: 4.1 mmol/L (ref 3.5–5.1)
Sodium: 131 mmol/L — ABNORMAL LOW (ref 135–145)
Total Bilirubin: 0.9 mg/dL (ref 0.3–1.2)
Total Protein: 7.4 g/dL (ref 6.5–8.1)

## 2020-03-07 LAB — CBC
HCT: 27.9 % — ABNORMAL LOW (ref 36.0–46.0)
Hemoglobin: 8.3 g/dL — ABNORMAL LOW (ref 12.0–15.0)
MCH: 30.6 pg (ref 26.0–34.0)
MCHC: 29.7 g/dL — ABNORMAL LOW (ref 30.0–36.0)
MCV: 103 fL — ABNORMAL HIGH (ref 80.0–100.0)
Platelets: 121 10*3/uL — ABNORMAL LOW (ref 150–400)
RBC: 2.71 MIL/uL — ABNORMAL LOW (ref 3.87–5.11)
RDW: 17.2 % — ABNORMAL HIGH (ref 11.5–15.5)
WBC: 7.4 10*3/uL (ref 4.0–10.5)
nRBC: 0 % (ref 0.0–0.2)

## 2020-03-07 LAB — GLUCOSE, CAPILLARY
Glucose-Capillary: 109 mg/dL — ABNORMAL HIGH (ref 70–99)
Glucose-Capillary: 119 mg/dL — ABNORMAL HIGH (ref 70–99)
Glucose-Capillary: 149 mg/dL — ABNORMAL HIGH (ref 70–99)

## 2020-03-07 LAB — MAGNESIUM: Magnesium: 2.4 mg/dL (ref 1.7–2.4)

## 2020-03-07 LAB — SARS CORONAVIRUS 2 (TAT 6-24 HRS): SARS Coronavirus 2: NEGATIVE

## 2020-03-07 LAB — PHOSPHORUS: Phosphorus: 6.3 mg/dL — ABNORMAL HIGH (ref 2.5–4.6)

## 2020-03-07 MED ORDER — MIDODRINE HCL 10 MG PO TABS: 10.0000 mg | ORAL_TABLET | Freq: Two times a day (BID) | ORAL | Status: DC

## 2020-03-07 MED ORDER — TRAMADOL HCL 50 MG PO TABS: 50.0000 mg | ORAL_TABLET | Freq: Three times a day (TID) | ORAL | 0 refills | Status: AC | PRN

## 2020-03-07 MED ORDER — DOXERCALCIFEROL 4 MCG/2ML IV SOLN: 2.0000 ug | INTRAVENOUS | Status: DC

## 2020-03-07 MED ORDER — TRAMADOL HCL 50 MG PO TABS
50.0000 mg | ORAL_TABLET | Freq: Four times a day (QID) | ORAL | Status: DC
  Administered 2020-03-07 – 2020-03-09 (×5): 100 mg via ORAL
  Administered 2020-03-09: 50 mg via ORAL
  Administered 2020-03-09: 100 mg via ORAL
  Filled 2020-03-07: qty 1
  Filled 2020-03-07 (×6): qty 2

## 2020-03-07 MED ORDER — CHLORHEXIDINE GLUCONATE CLOTH 2 % EX PADS
6.0000 | MEDICATED_PAD | Freq: Every day | CUTANEOUS | Status: DC
  Administered 2020-03-07 – 2020-03-08 (×2): 6 via TOPICAL

## 2020-03-07 MED ORDER — HYDROXYZINE HCL 25 MG PO TABS
ORAL_TABLET | ORAL | Status: AC
  Filled 2020-03-07: qty 1

## 2020-03-07 MED ORDER — CALCIUM CARBONATE 1250 (500 CA) MG PO TABS: 1250.0000 mg | ORAL_TABLET | Freq: Every day | ORAL | 0 refills | Status: DC

## 2020-03-07 NOTE — Plan of Care (Signed)
  Problem: Education: Goal: Knowledge of General Education information will improve Description: Including pain rating scale, medication(s)/side effects and non-pharmacologic comfort measures Outcome: Progressing   Problem: Safety: Goal: Ability to remain free from injury will improve Outcome: Progressing   Problem: Skin Integrity: Goal: Risk for impaired skin integrity will decrease Outcome: Progressing   

## 2020-03-07 NOTE — Progress Notes (Signed)
PROGRESS NOTE    Suzanne Levy  FYB:[id number] DOB: March 13, 1951 DOA: 02/28/2020 PCP: [name], MD  Brief Narrative:  69 year old morbidly obese BMI 44 African-American ESRD MWF [place] DM TY 2 neuropathy nephropathy PE on warfarin HTN OSA on CPAP Has not walked in 7 years uses a motorized wheelchair at home Admitted 7/20 secondary to striking right lower extremity near railing of stairs of her apartment building Found to have acute obliquely comminuted displaced proximal tibial shaft fracture Dr. 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 Right tibia fracture 2. Right knee laceration a. Defer to orthopedics further planning and management of the case b. Nonoperative candidate-59 would not support hardware c. She is intolerant to opiates other than tramadol therefore increase tramadol to 50-100 4 times daily currently on gabapentin 300 3 times daily d. ABSOLUTELY NO IV PAIN MEDICATION escalation unless we rounded on first and discussed with patient by attending physician during daytime 3. ESRD MWF 4. Renal osteodystrophy a. Defer to nephrology-plan b. Last phosphorus 6.3-continue Hectorol 2 mcg Monday Wednesday Friday, Renvela 800 3 times daily-may need to increase dosing 5. Chronic hypotension a. Difficult situation-do not think her blood pressures are accurate given her habitus continue midodrine 10 twice daily-may need to dose her Coreg on nondialysis days and change from 12.5 twice daily to 12.5 on nondialysis days-we will reassess in  a.m. 6. Macrocytic anemia a. Unclear etiology significant RDW as well as elevation in MCV b. Outpatient work-up 7. DM TY 2 A1c 7 a. Blood sugars are ranging from 109-119 b. Continue NPH 30 6 AM, 20 units at bedtime with moderate sliding scale c. [At home also takes 14 units NovoLog a.m. and p.m. ] 8. Sleep apnea not on CPAP\ a. Difficult situation-mortality is increased in these patients with OHSS were noncompliant- b. Needs outpatient pulmonology revisit 9. Right flank laceration secondary to skin biopsy a. Wound not observed by me nursing dressing with wet-to-dry's will reassess prior to discharge 10. Prior pulmonary embolism on Eliquis a. Continue the same needs lifelong management 11. Severe osteopenia secondary to renal osteodystrophy 12. Prior hyperkalemia is now resolved  DVT prophylaxis: Eliquis Code Status: Full Family Communication: None present Disposition: Inpatient pending skilled facility bed  Status is: Inpatient  Remains inpatient appropriate because:Hemodynamically unstable, IV treatments appropriate due to intensity of illness or inability to take PO and Inpatient level of care appropriate due to severity of illness   Dispo: The patient is from: Home              Anticipated d/c is to: SNF              Anticipated d/c date is: 1 day              Patient currently is not medically stable to d/c.       Consultants:   Orthopedics  Procedures: None  Antimicrobials: None   Subjective: Awake alert coherent asking and focused mainly on pain medications Asking specifically for IV meds Tells me that by name oxycodone causes itching and  is unwilling to the same despite my reassurances that this is not an allergy but intolerance-she is insistent on getting "something" for pain We had a long discussion about her renal issues and how this may affect clearance of some of these medications-I think she understood the importance of being judicious with pain  meds  Objective: Vitals:   03/07/20 1530 03/07/20 1600 03/07/20 1630 03/07/20 1648  BP: (!) 163/60 (!) 134/71 (!) 151/58 (!) 121/93  Pulse: 64 69 65 65  Resp: 12 12 (!) 11 12  Temp:      TempSrc:      SpO2:      Weight:      Height:        Intake/Output Summary (Last 24 hours) at 03/07/2020 1841 Last data filed at 03/07/2020 1648 Gross per 24 hour  Intake 360 ml  Output 3000 ml  Net -2640 ml   Filed Weights   03/05/20 1348 03/05/20 1820 03/07/20 1243  Weight: (!) 170.5 kg (!) 166.5 kg (!) 171 kg    Examination:  General exam: Obese Mallampati 4 EOMI NCAT no focal deficit Respiratory system: Clear no added sound no rales no rhonchi but very limited exam given her habitus Cardiovascular system: S1-S2 no murmur Gastrointestinal system: Morbidly obese  so exam is clinically challenging. Central nervous system: Neurologically intact moving upper limbs equally however right lower limb is limited in mobility secondary to pain Extremities: ROM intact except right lower extremity specifically at knee Skin: No lower extremity edema-I did not examine wound on the back today Psychiatry: Euthymic  Data Reviewed: I have personally reviewed following labs and imaging studies Sodium 131 Chloride 92 BUN/creatinine 49/7.4 Hemoglobin 8.3 MCV 103 Platelet 121   Radiology Studies: No results found.   Scheduled Meds: . acetaminophen  1,000 mg Oral Q8H  . apixaban  5 mg Oral BID  . calcium carbonate  1,250 mg Oral Q breakfast  . carvedilol  12.5 mg Oral BID WC  . Chlorhexidine Gluconate Cloth  6 each Topical Q0600  . clobetasol ointment  1 application Topical BID  . doxercalciferol  2 mcg Intravenous Q M,W,F-HD  . gabapentin  300 mg Oral TID  . hydrOXYzine      . insulin aspart  0-15 Units Subcutaneous TID WC  . insulin aspart  0-5 Units Subcutaneous QHS  . insulin NPH Human  20 Units Subcutaneous QHS  . insulin NPH Human  36 Units Subcutaneous QAC breakfast  . midodrine  10  mg Oral BID WC  . montelukast  10 mg Oral QHS  . sevelamer carbonate  800 mg Oral TID WC  . traMADol  50 mg Oral Q6H   Continuous Infusions:   LOS: 8 days    Time spent: 70 minutes  [name], MD [facility] To contact the attending provider between 7A-7P or the covering provider during after hours 7P-7A, please log into the web site [email or web address] and access using universal [place] password for that web site. If you do not have the password, please call the hospital operator.  03/07/2020, 6:41 PM

## 2020-03-07 NOTE — Progress Notes (Signed)
Suzanne Bossier, MD  Physician  Hospitalist  Progress Notes      Addendum  Date of Service:  03/05/2020  3:38 PM          Expand All Collapse All   PROGRESS NOTE       Suzanne Levy            OZD:[id number] DOB: May 06, 1951 DOA: 02/28/2020 PCP: [name], MD         Brief Narrative:  Suzanne Levy is a 69 y.o. BF PMHx ESRD on HD (MWF), DM type II uncontrolled with neuropathy, h/o PE on Eliquis anticoagulation (managed by HD center), morbid obesity, asthma, HTN, and OSA, limited mobility with wheelchair dependence    who presented to the emergency department due to acute onset of persistent and worsening right knee pain which started today.  Patient was on her motorized wheelchair yesterday when she accidentally struck her right lower leg against the railing near the stairs outside of her apartment building.  She complained of sudden onset of severe right mid shin pain after the incident, with the pain extending to right ankle and right knee.  Pain was constant and worsens with movement.  Patient states that she went to dialysis today (though, dialysis days are MWF as indicated above) because she was told that more fluid needed to be taken off her.  She denies fever, chills, chest pain, shortness of breath, headache.   ED Course:  In the emergency department, she was hemodynamically stable.  Work-up in the ED showed macrocytic anemia, hypokalemia, BUN/creatinine 65/9.26 (no recent labs for comparison with last lab being 5 years ago at 9.66).  Right tibia and fibula x-ray showed acute obliquely slightly comminuted and slightly displaced fracture extending through the proximal tibial shaft.  Additional possible subtle cortical irregularity at the right fibular head/neck, which could reflect an additional subtle nondisplaced fracture was noted.  IV fentanyl was given in the ED due to pain.  Splint was placed on affected area of the leg.  Orthopedic surgeon (Dr. [name]) was consulted and  will see patient in the morning once admitted by hospitalist team.  Hospitalist was asked to admit patient for further evaluation management.   Subjective: 727afebrile overnight, A/O x4, negative CP, negative abdominal pain.  Resting comfortably.  PT currently working with Pt to adjust her RLE brace.     Assessment & Plan: Covid vaccination; positive Covid vaccination   Principal Problem:   Right tibial fracture Active Problems:   Hyperkalemia   Obesity, Class III, BMI 40-49.9 (morbid obesity) (HCC)   Hyperglycemia due to diabetes mellitus (HCC)   Osteopenia   Macrocytic anemia   Abrasion of right knee   Asthma   ESRD (end stage renal disease) ([place])   Pulmonary embolism (HCC)   Fibula fracture   Necrobiosis lipoidica diabeticorum (HCC)     RIGHT Tibial fracture/Possible nondisplaced right fibular head/neck fracture -Per orthopedic surgery note 7/21 NO surgery contemplated -7/21 PT OT eval; permanent placement to long-term care facility, since surgery is not planning on fixing fracture in this morbidly obese, legally blind patient. -7/24 sent secure chat to [name] orthopedic surgeon requesting that we reconsider surgical fix to patient's lower extremity.  Over the last several days patient has not done well with her brace. -7/26 discussed case with [name] orthopedic surgeon, who explained that patient's fibula would not support any type of hardware and that the brace was the best treatment.  -7/27 doing well on new pain  regimen -Again spoke with RN about moving Pt to room with overhead hoist to assist with mobility    Pain control -7/22 pain currently uncontrolled, will slowly titrate up pain medication.   -7/22 placed on telemetry and continuous pulse ox monitor  Given patient has ESRD will need to be mindful of buildup of narcotic medication. -7/23 Tylenol 1000 mg TID -7/24 counseled patient that if we can get her blood pressure increased and stabilized will  attempt to increase her pain medication but currently unable to. -7/26 Fentanyl IV 25 mcg PRN -7/26 Tramadol 50 mg QID -7/26 increase Gabapentin 300 mg TID   Right knee abrasion -Minimal laceration   ESRD on HD M/W/F -BUN/creatinine 65/9.26 (no recent labs for comparison with last lab being 5 years ago at 9.66).  -7/23 patient should be dialyzed     Hypotension -7/25 Albumin 50 g x 1 -7/25 Midodrine 10 mg BID   Hyperkalemia  -Resolved   Macrocytic anemia? -H/H 9.8/32 MCV 105.3 -Anemia panel consistent with acute inflammatory process such as her acute fracture.   Osteopenia? -Vitamin D, 25-hydroxy WNL  -Calcium was normal at 9.3   DM type II uncontrolled with complication -2/67 hemoglobin A1c= 7.1 -NPH 36 units q breakfast -NPH 20 units q bedtime -Moderate SSI   Hx pulmonary embolism -Patient has history of PE  -Restarted Eliquis.      Asthma -   Obesity, class III (BMI 47.42) -This was determined by height 5\' 11"  and weight 154.2 kg -Per EMR patient was counseled about diet and exercise when more stable.  However given that patient will be mostly bedbound this is unrealistic   Obstructive sleep apnea not on CPAP -CPAP per respiratory   Constipation -MiraLAX 17 g x 1     Painful skin lesion left flank (Necrobiosis Lipoidica) Clobetasol 0.05% BID Apply to lateral thoracic area under pannus and place occlusive dressing over top.   Right flank skin laceration -On inspection of area that patient pointed out where sutures have been removed there was an old Band-Aid when removed frank pus emanated. -Notified RN who observed area and flushed area will begin daily wet-to-dry dressing change       DVT prophylaxis: Eliquis Code Status: Full Family Communication: 7/23 Sister at bedside for discussion of plan of care answered all questions Status is: Inpatient       Dispo: The patient is from: Home              Anticipated d/c is to: Long-term care facility               Anticipated d/c date is: 8/1              Patient currently unstable           Consultants:  Orthopedic surgery   Procedures/Significant Events:      I have personally reviewed and interpreted all radiology studies and my findings are as above.   VENTILATOR SETTINGS:       Cultures     Antimicrobials:       Devices                LINES / TUBES:          Continuous Infusions: . [START ON 03/06/2020] sodium chloride    . [START ON 03/06/2020] sodium chloride          Objective:       Vitals:    03/05/20 1402 03/05/20 1432 03/05/20 1500 03/05/20 1530  BP: (!) 110/23 (!) 101/31 (!) 88/31 (!) 138/101  Pulse: 61 66 64 77  Resp:          Temp:          TempSrc:          SpO2:          Weight:          Height:              Intake/Output Summary (Last 24 hours) at 03/05/2020 1538 Last data filed at 03/05/2020 0900    Gross per 24 hour  Intake 600 ml  Output --  Net 600 ml         Filed Weights    02/29/20 0012 02/29/20 1916 03/05/20 1348  Weight: (!) 154.2 kg (!) 165.6 kg (!) 170.5 kg      Physical Exam:   General: A/O x4 No acute respiratory distress Eyes: negative scleral hemorrhage, negative anisocoria, negative icterus ENT: Negative Runny nose, negative gingival bleeding, Neck:  Negative scars, masses, torticollis, lymphadenopathy, JVD Lungs: Clear to auscultation bilaterally without wheezes or crackles Cardiovascular: Regular rate and rhythm without murmur gallop or rub normal S1 and S2 Abdomen: MORBIDLY OBESE abdominal pain, nondistended, positive soft, bowel sounds, no rebound, no ascites, no appreciable mass Extremities: RIGHT lower extremity in splint and brace elevated at 40 degrees Skin: Necrobiosis Lipoidica under pannus left side, surgical laceration under pannus right side Psychiatric:  Negative depression, negative anxiety, negative fatigue, negative mania  Central nervous system:  Cranial nerves II through XII intact,  tongue/uvula midline, all extremities muscle strength 5/5, sensation intact throughout, negative dysarthria, negative expressive aphasia, negative receptive aphasia. .   .        Data Reviewed: Care during the described time interval was provided by me .  I have reviewed this patient's available data, including medical history, events of note, physical examination, and all test results as part of my evaluation.

## 2020-03-07 NOTE — Procedures (Signed)
I was present at this dialysis session. I have reviewed the session itself and made appropriate changes.   qbf 450, uf goal 3500, bp 120/32 via rue avg. Tolerating treatment, no changes. Possibly will be discharged to snf tomorrow  Filed Weights   03/05/20 1348 03/05/20 1820 03/07/20 1243  Weight: (!) 170.5 kg (!) 166.5 kg (!) 171 kg    Recent Labs  Lab 03/07/20 0801  NA 131*  K 4.1  CL 92*  CO2 27  GLUCOSE 135*  BUN 49*  CREATININE 7.40*  CALCIUM 9.3  PHOS 6.3*    Recent Labs  Lab 03/01/20 0305 03/01/20 0305 03/02/20 0459 03/02/20 0459 03/05/20 0631 03/06/20 0320 03/07/20 0801  WBC 7.7   < > 7.6   < > 9.9 8.7 7.4  NEUTROABS 6.1  --  5.7  --   --   --   --   HGB 8.4*   < > 8.7*   < > 8.3* 8.0* 8.3*  HCT 28.0*   < > 28.4*   < > 26.9* 26.5* 27.9*  MCV 104.1*   < > 102.5*   < > 101.5* 103.1* 103.0*  PLT 120*   < > 153   < > 116* 114* 121*   < > = values in this interval not displayed.    Scheduled Meds: . acetaminophen  1,000 mg Oral Q8H  . apixaban  5 mg Oral BID  . calcium carbonate  1,250 mg Oral Q breakfast  . carvedilol  12.5 mg Oral BID WC  . Chlorhexidine Gluconate Cloth  6 each Topical Q0600  . clobetasol ointment  1 application Topical BID  . doxercalciferol  2 mcg Intravenous Q M,W,F-HD  . gabapentin  300 mg Oral TID  . hydrOXYzine      . insulin aspart  0-15 Units Subcutaneous TID WC  . insulin aspart  0-5 Units Subcutaneous QHS  . insulin NPH Human  20 Units Subcutaneous QHS  . insulin NPH Human  36 Units Subcutaneous QAC breakfast  . midodrine  10 mg Oral BID WC  . montelukast  10 mg Oral QHS  . sevelamer carbonate  800 mg Oral TID WC  . traMADol  50 mg Oral Q6H   Continuous Infusions: PRN Meds:.albuterol, fentaNYL (SUBLIMAZE) injection, fluticasone, hydrOXYzine, prochlorperazine   Gean Quint, MD [facility] 03/07/2020, 1:54 PM

## 2020-03-07 NOTE — Progress Notes (Signed)
[facility] Progress Note   Subjective:   Patient seen and examined at bedside.  No new complaints.  Denies n/v/d, abdominal pain, SOB, CP and edema.  States she is not sure which SNF she wants to go to, she does not feel like she has enough information to decided.  Objective Vitals:   03/06/20 0817 03/06/20 1950 03/06/20 2233 03/07/20 0437  BP: (!) 108/60 90/65  (!) 92/56  Pulse: 65 61 72 67  Resp: 16 16 19 18   Temp: 98.3 F (36.8 C) 98.4 F (36.9 C)  98.2 F (36.8 C)  TempSrc: Oral Oral  Oral  SpO2: 99% 100% 92% 98%  Weight:      Height:       Physical Exam General:WD, obese female in NAD Heart:RRR Lungs:CTAB anteriolaterally Abdomen:soft, NTND Extremities:trace edema, R leg in brace Dialysis Access: LU AVG +b/t   Filed Weights   02/29/20 1916 03/05/20 1348 03/05/20 1820  Weight: (!) 165.6 kg (!) 170.5 kg (!) 166.5 kg    Intake/Output Summary (Last 24 hours) at 03/07/2020 0833 Last data filed at 03/06/2020 1230 Gross per 24 hour  Intake 600 ml  Output --  Net 600 ml    Additional Objective Labs: Basic Metabolic Panel: Recent Labs  Lab 03/04/20 0235 03/05/20 0631 03/06/20 0320  NA 131* 130* 133*  K 3.7 4.3 4.0  CL 93* 92* 95*  CO2 26 25 26   GLUCOSE 142* 135* 154*  BUN 42* 58* 32*  CREATININE 7.08* 9.24* 5.59*  CALCIUM 8.7* 9.0 8.7*  PHOS 6.2* 7.3* 4.7*   Liver Function Tests: Recent Labs  Lab 03/04/20 0235 03/05/20 0631 03/06/20 0320  AST 13* 14* 14*  ALT 17 17 18   ALKPHOS 84 90 92  BILITOT 0.5 0.3 0.6  PROT 6.7 7.2 6.8  ALBUMIN 2.6* 3.1* 2.9*   CBC: Recent Labs  Lab 02/29/20 0931 02/29/20 0931 03/01/20 0305 03/01/20 0305 03/02/20 0459 03/05/20 0631 03/06/20 0320  WBC 7.9   < > 7.7   < > 7.6 9.9 8.7  NEUTROABS 6.2  --  6.1  --  5.7  --   --   HGB 8.8*   < > 8.4*   < > 8.7* 8.3* 8.0*  HCT 29.8*   < > 28.0*   < > 28.4* 26.9* 26.5*  MCV 103.8*  --  104.1*  --  102.5* 101.5* 103.1*  PLT 129*   < > 120*   < > 153 116*  114*   < > = values in this interval not displayed.   CBG: Recent Labs  Lab 03/06/20 0655 03/06/20 1148 03/06/20 1621 03/06/20 2329 03/07/20 0637  GLUCAP 103* 171* 196* 205* 109*    Medications:   acetaminophen  1,000 mg Oral Q8H   apixaban  5 mg Oral BID   calcium carbonate  1,250 mg Oral Q breakfast   carvedilol  12.5 mg Oral BID WC   Chlorhexidine Gluconate Cloth  6 each Topical Q0600   clobetasol ointment  1 application Topical BID   doxercalciferol  2 mcg Intravenous Q M,W,F-HD   gabapentin  300 mg Oral TID   insulin aspart  0-15 Units Subcutaneous TID WC   insulin aspart  0-5 Units Subcutaneous QHS   insulin NPH Human  20 Units Subcutaneous QHS   insulin NPH Human  36 Units Subcutaneous QAC breakfast   midodrine  10 mg Oral BID WC   montelukast  10 mg Oral QHS   sevelamer carbonate  800 mg  Oral TID WC   traMADol  50 mg Oral Q6H    Dialysis Orders: AF MWF 4 hours and 15 minutes  BF 500, DF 800  EDW 156 kg  2K /2.25 Ca  AV graft  Not on heparin  Note that Coumadin is not on her outpatient medication list  Meds: mircera 150 mcg every 2 weeks; last given on 7/19  hectorol 2 mcg three times a week  On 7/19 left at 165.9 kg and on 7/20 left at 161.5 kg  Assessment/Plan: 1.R tibial Frx- per ortho. Plans for conservative management. Pain control per primary.  2. ESRD- on MWF. HD today per regular schedule. Last K 4.0. 3. Anemiaof CKD-Hgb 8.0. Recent ESA dose. Tsat 22%, Ferratin >7500 likely d/t inflammatory response. 4. Secondary hyperparathyroidism- Ca and phos in goal. Continue VDRA and binders.  5.Hypotension/volume- chronic hypotension, on midodrine. 10kg over edw per bed weights, unsure if they are correct. Does not appear grossly volume overloaded but continues to be hypoxic requiring O2. Attempt UF goal 3-4L tomorrow.  UF limited by hypotension. 6. Nutrition- Renal diet w/fluid restrictions.  7. DM - per primary 8. Dispo  -plans to d/c to SNF.  SW providing list of SNF in the area for patient to chose from.  9. Hx PE - on eliquis 10. OSA - on CPAP  Jen Mow, PA-C [place] 03/07/2020,8:33 AM  LOS: 8 days

## 2020-03-08 DIAGNOSIS — S82191A Other fracture of upper end of right tibia, initial encounter for closed fracture: Secondary | ICD-10-CM | POA: Diagnosis not present

## 2020-03-08 LAB — COMPREHENSIVE METABOLIC PANEL
ALT: 21 U/L (ref 0–44)
AST: 19 U/L (ref 15–41)
Albumin: 2.8 g/dL — ABNORMAL LOW (ref 3.5–5.0)
Alkaline Phosphatase: 122 U/L (ref 38–126)
Anion gap: 12 (ref 5–15)
BUN: 28 mg/dL — ABNORMAL HIGH (ref 8–23)
CO2: 25 mmol/L (ref 22–32)
Calcium: 8.8 mg/dL — ABNORMAL LOW (ref 8.9–10.3)
Chloride: 96 mmol/L — ABNORMAL LOW (ref 98–111)
Creatinine, Ser: 5 mg/dL — ABNORMAL HIGH (ref 0.44–1.00)
GFR calc Af Amer: 9 mL/min — ABNORMAL LOW (ref >60)
GFR calc non Af Amer: 8 mL/min — ABNORMAL LOW (ref >60)
Glucose, Bld: 297 mg/dL — ABNORMAL HIGH (ref 70–99)
Potassium: 3.9 mmol/L (ref 3.5–5.1)
Sodium: 133 mmol/L — ABNORMAL LOW (ref 135–145)
Total Bilirubin: 0.7 mg/dL (ref 0.3–1.2)
Total Protein: 6.9 g/dL (ref 6.5–8.1)

## 2020-03-08 LAB — GLUCOSE, CAPILLARY
Glucose-Capillary: 263 mg/dL — ABNORMAL HIGH (ref 70–99)
Glucose-Capillary: 198 mg/dL — ABNORMAL HIGH (ref 70–99)
Glucose-Capillary: 136 mg/dL — ABNORMAL HIGH (ref 70–99)
Glucose-Capillary: 233 mg/dL — ABNORMAL HIGH (ref 70–99)

## 2020-03-08 LAB — CBC
HCT: 28.1 % — ABNORMAL LOW (ref 36.0–46.0)
Hemoglobin: 8.4 g/dL — ABNORMAL LOW (ref 12.0–15.0)
MCH: 31.2 pg (ref 26.0–34.0)
MCHC: 29.9 g/dL — ABNORMAL LOW (ref 30.0–36.0)
MCV: 104.5 fL — ABNORMAL HIGH (ref 80.0–100.0)
Platelets: 103 10*3/uL — ABNORMAL LOW (ref 150–400)
RBC: 2.69 MIL/uL — ABNORMAL LOW (ref 3.87–5.11)
RDW: 17.5 % — ABNORMAL HIGH (ref 11.5–15.5)
WBC: 7.2 10*3/uL (ref 4.0–10.5)
nRBC: 0 % (ref 0.0–0.2)

## 2020-03-08 LAB — PHOSPHORUS: Phosphorus: 4.2 mg/dL (ref 2.5–4.6)

## 2020-03-08 LAB — MAGNESIUM: Magnesium: 2.2 mg/dL (ref 1.7–2.4)

## 2020-03-08 MED ORDER — CHLORHEXIDINE GLUCONATE CLOTH 2 % EX PADS
6.0000 | MEDICATED_PAD | Freq: Every day | CUTANEOUS | Status: DC
  Administered 2020-03-09: 6 via TOPICAL

## 2020-03-08 MED ORDER — TRAMADOL HCL 50 MG PO TABS: 50.0000 mg | ORAL_TABLET | Freq: Four times a day (QID) | ORAL | 0 refills | Status: AC

## 2020-03-08 NOTE — Plan of Care (Signed)

## 2020-03-08 NOTE — Progress Notes (Signed)
[facility] Progress Note   Subjective:   Patient seen and examined at bedside. Feeling pretty good today.  Breathing well without oxygen.  Denies SOB, CP, n/v/d, abdominal pain and fatigue.  Objective Vitals:   03/07/20 1648 03/07/20 1956 03/08/20 0355 03/08/20 0824  BP: (!) 121/93 92/78 102/69 (!) 105/90  Pulse: 65 67 62 68  Resp: 12 18 16 18   Temp:  97.8 F (36.6 C) 98.5 F (36.9 C) 98.6 F (37 C)  TempSrc:  Oral Oral Oral  SpO2:  94% 97% 98%  Weight:      Height:       Physical Exam General:WD, obese female in NAD Heart:RRR Lungs:CTAB anterolaterally, nml WOB on RA Abdomen:soft, NTND Extremities:no edema, RLE in brace Dialysis Access: LU AVG +b/t   Filed Weights   03/05/20 1348 03/05/20 1820 03/07/20 1243  Weight: (!) 170.5 kg (!) 166.5 kg (!) 171 kg    Intake/Output Summary (Last 24 hours) at 03/08/2020 0927 Last data filed at 03/08/2020 0500 Gross per 24 hour  Intake --  Output 3000 ml  Net -3000 ml    Additional Objective Labs: Basic Metabolic Panel: Recent Labs  Lab 03/06/20 0320 03/07/20 0801 03/08/20 0331  NA 133* 131* 133*  K 4.0 4.1 3.9  CL 95* 92* 96*  CO2 26 27 25   GLUCOSE 154* 135* 297*  BUN 32* 49* 28*  CREATININE 5.59* 7.40* 5.00*  CALCIUM 8.7* 9.3 8.8*  PHOS 4.7* 6.3* 4.2   Liver Function Tests: Recent Labs  Lab 03/06/20 0320 03/07/20 0801 03/08/20 0331  AST 14* 15 19  ALT 18 19 21   ALKPHOS 92 105 122  BILITOT 0.6 0.9 0.7  PROT 6.8 7.4 6.9  ALBUMIN 2.9* 2.9* 2.8*   CBC: Recent Labs  Lab 03/02/20 0459 03/02/20 0459 03/05/20 0631 03/05/20 0631 03/06/20 0320 03/07/20 0801 03/08/20 0331  WBC 7.6   < > 9.9   < > 8.7 7.4 7.2  NEUTROABS 5.7  --   --   --   --   --   --   HGB 8.7*   < > 8.3*   < > 8.0* 8.3* 8.4*  HCT 28.4*   < > 26.9*   < > 26.5* 27.9* 28.1*  MCV 102.5*  --  101.5*  --  103.1* 103.0* 104.5*  PLT 153   < > 116*   < > 114* 121* 103*   < > = values in this interval not displayed.    CBG: Recent Labs  Lab 03/06/20 2329 03/07/20 0637 03/07/20 1147 03/07/20 2051 03/08/20 0647  GLUCAP 205* 109* 119* 149* 263*    Medications:   acetaminophen  1,000 mg Oral Q8H   apixaban  5 mg Oral BID   calcium carbonate  1,250 mg Oral Q breakfast   carvedilol  12.5 mg Oral BID WC   Chlorhexidine Gluconate Cloth  6 each Topical Q0600   clobetasol ointment  1 application Topical BID   doxercalciferol  2 mcg Intravenous Q M,W,F-HD   gabapentin  300 mg Oral TID   insulin aspart  0-15 Units Subcutaneous TID WC   insulin aspart  0-5 Units Subcutaneous QHS   insulin NPH Human  20 Units Subcutaneous QHS   insulin NPH Human  36 Units Subcutaneous QAC breakfast   midodrine  10 mg Oral BID WC   montelukast  10 mg Oral QHS   sevelamer carbonate  800 mg Oral TID WC   traMADol  50-100 mg Oral Q6H  Dialysis Orders: AF MWF 4 hours and 15 minutes  BF 500, DF 800  EDW 156 kg  2K /2.25 Ca  AV graft  Not on heparin  Note that Coumadin is not on her outpatient medication list  Meds: mircera 150 mcg every 2 weeks; last given on 7/19  hectorol 2 mcg three times a week  On 7/19 left at 165.9 kg and on 7/20 left at 161.5 kg  Assessment/Plan: 1.R tibial Frx- per ortho. Plans for conservative management. Pain control per primary.  2. ESRD- on MWF. HD yesterday tolerated well.  Next HD 7/30, can be completed at OP. Last K 3.9.  3. Anemiaof CKD-Hgb 8.4. Recent ESA dose, next dose due 8/2. Tsat 22%, Ferratin >7500 likely d/t inflammatory response. 4. Secondary hyperparathyroidism- Caand phos in goal.Continue VDRA and binders.  5.Hypotension/volume- chronic hypotension, on midodrine. 10kg over edw per bed weights, doubt the accuracy. Does not appeargrosslyvolume overloaded.  No longer requiring O2.  Net UF 4L removed yesterday.UF limited by hypotension. 6. Nutrition- Renal diet w/fluid restrictions.  7. DM- per primary 8. Dispo -plans to d/c to  SNF. [place] for d/c from renal stand point.  9. Hx PE - on eliquis 10. OSA - on CPAP  Jen Mow, PA-C [place] 03/08/2020,9:27 AM  LOS: 9 days

## 2020-03-08 NOTE — Discharge Summary (Signed)
Physician Discharge Summary  Suzanne Levy ACZ:[id number] DOB: 1950-10-12 DOA: 02/28/2020  PCP: [name], MD  Admit date: 02/28/2020 Discharge date: 03/08/2020  Time spent: 35 minutes  Recommendations for Outpatient Follow-up:  1. Tylenol first choice, tramadol second choice for more severe pain-has intolerances to oxycodone NOT allergies 2. Caution with pain meds to given sedative effects in the setting of renal insufficiency 3. Requires Chem-12, CBC in 1 week 4. Should follow-up with orthopedics [name] in 4 to 6 weeks-nursing home physician to kindly arrange 5. Will need wheelchair 6. Will need bariatric bed at facility 7. Will be scheduled by dialysis center for HD on usual Monday Wednesday Friday basis 8. Recommend iron studies in about 3 weeks 9. Recommend phosphorus checks in 1 to 2 weeks with labs 10. 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[place])   Discharge Condition: Fair  Diet recommendation: Diabetic hypertensive  Filed Weights   03/05/20 1348 03/05/20 1820 03/07/20 1243  Weight: (!) 170.5 kg (!) 166.5 kg (!) 171 kg    History of present illness:  69 year old morbidly obese BMI 87 African-American ESRD MWF [place] DM TY 2 neuropathy nephropathy PE on warfarin HTN OSA on CPAP Has not walked in 7 years uses a motorized wheelchair at home Admitted 7/20 secondary to striking right lower extremity near railing of stairs of her apartment building Found to have acute obliquely  comminuted displaced proximal tibial shaft fracture Dr. [name] was consulted who recommended nonoperative management  Hospital Course:  1. Right tibia fracture 2. Right knee laceration a. Appreciate Dr. [name] thoughtfulness-needs outpatient follow-up with him in 2 to 4 weeks please arrange b. Nonoperative candidate-BMI of 52 as well as poor quality of fibula would not support hardware c. She is intolerant to opiates other than tramadol therefore increase tramadol to 50-100 4 times daily currently on gabapentin 300 3 times daily d. Caution with sedation given she is a dialysis patient on various meds with propensity for the same 3. ESRD MWF 4. Renal osteodystrophy a. Defer to nephrology-will be discharged to skilled facility and continue MWF dialysis b. continue Hectorol 2 mcg Monday Wednesday Friday, Renvela 800 3 times daily-may need to increase dosing 5. Chronic hypotension a. Difficult situation-do not think her blood pressures are accurate given her habitus continue midodrine 10 twice daily b. Consider dose her Coreg on nondialysis days and change from 12.5 twice daily to 12.5 on nondialysis days if trends of blood pressure continued to remain low 6. Macrocytic anemia a. Unclear etiology significant RDW as well as elevation in MCV b. Outpatient work-up 7. DM TY 2 A1c 7 a. Blood sugars well controlled during hospital stay b. Continue NPH 30 6 AM, 20 units at bedtime with moderate sliding scale c. [At home also takes 14 units NovoLog a.m. and p.m. ] which was resumed during hospital stay and on discharge 8. Sleep apnea not on CPAP 9. BMI >50 10. Nonambulatory for 7 years a. Difficult situation-mortality is increased in these patients with OHSS were noncompliant- b. Needs  outpatient pulmonology revisit 11. Right flank laceration secondary to skin biopsy a. Wound reviewed by me on day of discharge 7/29 5X 1 cm on right flank and clean b. Needs wet-to-dry dressings 12. Prior pulmonary  embolism on Eliquis a. Continue the same needs lifelong management 13. Severe osteopenia secondary to renal osteodystrophy 14. Prior hyperkalemia is now resolved  Procedures: None  Consultations:  [name] of orthopedics  Discharge Exam: Vitals:   03/08/20 0355 03/08/20 0824  BP: 102/69 (!) 105/90  Pulse: 62 68  Resp: 16 18  Temp: 98.5 F (36.9 C) 98.6 F (37 C)  SpO2: 97% 98%    General: Obese awake oriented thick neck Mallampati 4 Cardiovascular: S1-S2 no murmur rub gallop Respiratory: Chest clear Right flank shows wound as above 5 x 1 cm clean base margins without any slough Right leg is in knee immobilizer and has wrapped dressings Left leg is nonedematous but rather has tissue fluid Abdomen is obese precluding exam  Discharge Instructions   Discharge Instructions    Diet - low sodium heart healthy   Complete by: As directed    Diet - low sodium heart healthy   Complete by: As directed    Discharge wound care:   Complete by: As directed    Active Pressure Injury/Wound(s)    Wound         Wound / Incision (Open or Dehisced) 02/29/20 Other (Comment) Sacrum Mid nonblanchable pressure ulcer 7 days   Wound / Incision (Open or Dehisced) 03/02/20 Other (Comment) Flank Right skin tag excision site 4 days     Wound Care Orders (From admission, onward)    Start     Ordered   03/03/20 0500   Wound care  Daily      Comments: Wound care to right flank, wet to dry dressing once a day.  03/02/20 1710   02/29/20 0500   Wound care  Daily       02/29/20 0131   Discharge wound care:   Complete by: As directed    Active Pressure Injury/Wound(s)    Wound         Wound / Incision (Open or Dehisced) 02/29/20 Other (Comment) Sacrum Mid nonblanchable pressure ulcer 8 days   Wound / Incision (Open or Dehisced) 03/02/20 Other (Comment) Flank Right skin tag excision site   Increase activity slowly   Complete by: As directed    Increase activity  slowly   Complete by: As directed      Allergies as of 03/08/2020      Reactions   Enoxaparin Other (See Comments)   unknown   Lovenox [enoxaparin Sodium] Other (See Comments)   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 Allergen: BLOOD THINNERS, Other Reaction: eye hemorrhage, seafood, (can eat salmon), acid drinks like orange juice,grape fruitt juice   Ace Inhibitors Other (See Comments)   ESRD   Aspirin Other (See Comments)   Eye hemorrhage   Buprenorphine Hcl Other (See Comments)   unknown   Codeine Itching   Duloxetine Other (See Comments)   unknown   Gentamicin Other (See Comments)   unknown   Heparin Other (See Comments)   "depletes something in my system"   Hydrocodone Itching   Morphine And Related Other (See Comments)   unknown   Oxycodone Itching   Penicillins Other (See Comments)   unknown   Vancomycin Other (See Comments)   unknown  Cephalexin Rash   Cephalosporins Rash   Tomato Rash      Medication List    STOP taking these medications   ferric citrate 1 GM 210 MG(Fe) tablet Commonly known as: AURYXIA     TAKE these medications   acetaminophen 500 MG tablet Commonly known as: TYLENOL Take 1,000 mg by mouth 3 (three) times daily.   albuterol 108 (90 Base) MCG/ACT inhaler Commonly known as: VENTOLIN HFA Inhale 2 puffs into the lungs every 4 (four) hours as needed for wheezing or shortness of breath.   ascorbic acid 1000 MG tablet Commonly known as: VITAMIN C Take 1,000 mg by mouth 2 (two) times daily.   calcium carbonate 1250 (500 Ca) MG tablet Commonly known as: OS-CAL - dosed in mg of elemental calcium Take 1 tablet (1,250 mg total) by mouth daily with breakfast.   carvedilol 12.5 MG tablet Commonly known as: COREG Take 12.5 mg by mouth 2 (two) times daily with a meal.   doxercalciferol 4 MCG/2ML injection Commonly known as: HECTOROL Inject 1 mL (2 mcg total)  into the vein every Monday, Wednesday, and Friday with hemodialysis.   Eliquis 5 MG Tabs tablet Generic drug: apixaban Take 5 mg by mouth 2 (two) times daily.   eucerin cream Apply 1 application topically 2 (two) times daily.   fluticasone 50 MCG/ACT nasal spray Commonly known as: FLONASE Place 1 spray into both nostrils daily as needed for allergies.   folic acid 1 MG tablet Commonly known as: FOLVITE Take 1 mg by mouth daily.   gabapentin 100 MG capsule Commonly known as: NEURONTIN Take 200 mg by mouth 3 (three) times daily.   hydrOXYzine 25 MG tablet Commonly known as: ATARAX/VISTARIL Take 25 mg by mouth 3 (three) times daily.   insulin aspart 100 UNIT/ML injection Commonly known as: novoLOG Inject 14 Units into the skin in the morning and at bedtime.   insulin NPH Human 100 UNIT/ML injection Commonly known as: NOVOLIN N Inject 20-36 Units into the skin. Inject 36 units in the morning and 20 units at night.   ketoconazole 2 % cream Commonly known as: NIZORAL Apply 1 application topically 2 (two) times daily.   lanolin ointment Apply 1 application topically 2 (two) times daily.   midodrine 10 MG tablet Commonly known as: PROAMATINE Take 1 tablet (10 mg total) by mouth 2 (two) times daily with a meal. What changed: when to take this   montelukast 10 MG tablet Commonly known as: SINGULAIR Take 10 mg by mouth daily.   multivitamin Tabs tablet Take 1 tablet by mouth daily.   pantoprazole 40 MG tablet Commonly known as: PROTONIX Take 40 mg by mouth daily.   polyethylene glycol 17 g packet Commonly known as: MIRALAX / GLYCOLAX Take 17 g by mouth 2 (two) times daily.   pravastatin 40 MG tablet Commonly known as: PRAVACHOL Take 40 mg by mouth daily.   senna 8.6 MG Tabs tablet Commonly known as: SENOKOT Take 1 tablet by mouth 3 (three) times daily.   sevelamer carbonate 800 MG tablet Commonly known as: RENVELA Take 800 mg by mouth 3 (three) times daily  with meals.   traMADol 50 MG tablet Commonly known as: ULTRAM Take 1-2 tablets (50-100 mg total) by mouth 3 (three) times daily as needed for up to 7 days. What changed: how much to take   traMADol 50 MG tablet Commonly known as: ULTRAM Take 1-2 tablets (50-100 mg total) by mouth every 6 (six) hours. What changed:  You were already taking a medication with the same name, and this prescription was added. Make sure you understand how and when to take each.            Discharge Care Instructions  (From admission, onward)         Start     Ordered   03/08/20 0000  Discharge wound care:       Comments: Active Pressure Injury/Wound(s)    Wound         Wound / Incision (Open or Dehisced) 02/29/20 Other (Comment) Sacrum Mid nonblanchable pressure ulcer 8 days   Wound / Incision (Open or Dehisced) 03/02/20 Other (Comment) Flank Right skin tag excision site   03/08/20 1012   03/07/20 0000  Discharge wound care:       Comments: Active Pressure Injury/Wound(s)    Wound         Wound / Incision (Open or Dehisced) 02/29/20 Other (Comment) Sacrum Mid nonblanchable pressure ulcer 7 days   Wound / Incision (Open or Dehisced) 03/02/20 Other (Comment) Flank Right skin tag excision site 4 days     Wound Care Orders (From admission, onward)    Start     Ordered   03/03/20 0500   Wound care  Daily      Comments: Wound care to right flank, wet to dry dressing once a day.  03/02/20 1710   02/29/20 0500   Wound care  Daily       02/29/20 0131   03/07/20 1113         Allergies  Allergen Reactions  . Enoxaparin Other (See Comments)    unknown  . 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 Other Other (See Comments)    Uncoded Allergy. Allergen: BLOOD THINNERS, Other Reaction: eye hemorrhage, seafood, (can eat salmon), acid drinks like orange juice,grape fruitt juice  . Ace Inhibitors  Other (See Comments)    ESRD   . Aspirin Other (See Comments)    Eye hemorrhage  . Buprenorphine Hcl Other (See Comments)    unknown  . Codeine Itching  . Duloxetine Other (See Comments)    unknown  . Gentamicin Other (See Comments)    unknown  . Heparin Other (See Comments)    "depletes something in my system"  . Hydrocodone Itching  . Morphine And Related Other (See Comments)    unknown  . Oxycodone Itching  . Penicillins Other (See Comments)    unknown  . Vancomycin Other (See Comments)    unknown  . Cephalexin Rash  . Cephalosporins Rash  . Tomato Rash    Follow-up Information    Marybelle Killings, MD Follow up in 2 week(s).   Specialty: Orthopedic Surgery Contact information: [place] [address] [phone]                The results of significant diagnostics from this hospitalization (including imaging, microbiology, ancillary and laboratory) are listed below for reference.    Significant Diagnostic Studies: DG Tibia/Fibula Right  Result Date: 02/28/2020 CLINICAL DATA:  Initial evaluation for acute pain status post injury. EXAM: RIGHT TIBIA AND FIBULA - 2 VIEW COMPARISON:  None. FINDINGS: Examination technically limited by positioning and underlying severe osteopenia. Single fixation screw traverses the proximal tibial shaft. Just inferiorly, there is an acute oblique slightly comminuted and slightly displaced fracture extending through the proximal tibial shaft. Additional possible subtle  cortical irregularity at the right fibular head/neck, which could reflect an additional subtle nondisplaced fracture. No other definite acute osseous abnormality. Advanced osteoarthritic changes noted about the knee and ankle. No visible soft tissue injury. Extensive vascular calcifications noted. IMPRESSION: 1. Acute oblique slightly comminuted and slightly displaced fracture extending through the proximal tibial shaft. 2. Additional possible subtle cortical  irregularity at the right fibular head/neck, which could reflect an additional subtle nondisplaced fracture. Correlation with physical exam recommended. 3. Underlying severe osteopenia. Electronically Signed   By: Jeannine Boga M.D.   On: 02/28/2020 21:22   DG Ankle Complete Right  Result Date: 02/28/2020 CLINICAL DATA:  Initial evaluation for acute pain status post injury. EXAM: RIGHT ANKLE - COMPLETE 3+ VIEW COMPARISON:  None. FINDINGS: Examination is technically limited by patient positioning. Additionally, severe osteopenia limits evaluation for possible subtle acute nondisplaced fractures. No definite acute fracture or dislocation. Ankle mortise approximated. No definite abnormality about the visualized foot. No visible soft tissue injury. Extensive vascular calcifications noted. IMPRESSION: Technically limited study due to patient positioning and severe osteopenia. No definite acute osseous abnormality about the right ankle. Electronically Signed   By: Jeannine Boga M.D.   On: 02/28/2020 21:11   DG Knee Complete 4 Views Right  Result Date: 02/28/2020 CLINICAL DATA:  Initial evaluation for acute pain status post injury. EXAM: RIGHT KNEE - COMPLETE 4+ VIEW COMPARISON:  None. FINDINGS: Examination technically limited by patient positioning and severe osteopenia. Single fixation screw traverses the proximal tibia. There is an acute oblique slightly comminuted and displaced fracture extending through the proximal right tibial shaft. Question additional subtle cortical irregularity at the right fibular head/neck, which could reflect an additional nondisplaced fracture. No other definite acute osseous abnormality. Visualized distal femur intact. Patella not well visualized. Severe osteoarthritic changes present about the knee. No obvious or visible joint effusion. Extensive vascular calcifications seen throughout the visualized soft tissues. IMPRESSION: 1. Acute oblique slightly comminuted and  displaced fracture extending through the proximal right tibial shaft. 2. Question additional subtle cortical irregularity at the right fibular head/neck, which could reflect an additional nondisplaced fracture. Correlation with physical exam recommended. 3. Underlying severe osteopenia with advanced osteoarthritic changes about the right knee. Electronically Signed   By: Jeannine Boga M.D.   On: 02/28/2020 21:19    Microbiology: Recent Results (from the past 240 hour(s))  SARS Coronavirus 2 by RT PCR (hospital order, performed in [facility] hospital lab) Nasopharyngeal Nasopharyngeal Swab     Status: None   Collection Time: 02/28/20 11:23 PM   Specimen: Nasopharyngeal Swab  Result Value Ref Range Status   SARS Coronavirus 2 NEGATIVE NEGATIVE Final    Comment: (NOTE) SARS-CoV-2 target nucleic acids are NOT DETECTED.  The SARS-CoV-2 RNA is generally detectable in upper and lower respiratory specimens during the acute phase of infection. The lowest concentration of SARS-CoV-2 viral copies this assay can detect is 250 copies / mL. A negative result does not preclude SARS-CoV-2 infection and should not be used as the sole basis for treatment or other patient management decisions.  A negative result may occur with improper specimen collection / handling, submission of specimen other than nasopharyngeal swab, presence of viral mutation(s) within the areas targeted by this assay, and inadequate number of viral copies (<250 copies / mL). A negative result must be combined with clinical observations, patient history, and epidemiological information.  Fact Sheet for Patients:   StrictlyIdeas.no  Fact Sheet for Healthcare Providers: BankingDealers.co.za  This test is not yet  approved or  cleared by the [place] and has been authorized for detection and/or diagnosis of SARS-CoV-2 by FDA under an Emergency Use Authorization (EUA).   This EUA will remain in effect (meaning this test can be used) for the duration of the COVID-19 declaration under Section 564(b)(1) of the Act, 21 U.S.C. section 360bbb-3(b)(1), unless the authorization is terminated or revoked sooner.  Performed at [facility], [place] [address] [place], [place] [address]   SARS CORONAVIRUS 2 (TAT 6-24 HRS) Nasopharyngeal Nasopharyngeal Swab     Status: None   Collection Time: 03/07/20 10:29 AM   Specimen: Nasopharyngeal Swab  Result Value Ref Range Status   SARS Coronavirus 2 NEGATIVE NEGATIVE Final    Comment: (NOTE) SARS-CoV-2 target nucleic acids are NOT DETECTED.  The SARS-CoV-2 RNA is generally detectable in upper and lower respiratory specimens during the acute phase of infection. Negative results do not preclude SARS-CoV-2 infection, do not rule out co-infections with other pathogens, and should not be used as the sole basis for treatment or other patient management decisions. Negative results must be combined with clinical observations, patient history, and epidemiological information. The expected result is Negative.  Fact Sheet for Patients: SugarRoll.be  Fact Sheet for Healthcare Providers: https://www.woods-mathews.com/  This test is not yet approved or cleared by the [place] FDA and  has been authorized for detection and/or diagnosis of SARS-CoV-2 by FDA under an Emergency Use Authorization (EUA). This EUA will remain  in effect (meaning this test can be used) for the duration of the COVID-19 declaration under Se ction 564(b)(1) of the Act, 21 U.S.C. section 360bbb-3(b)(1), unless the authorization is terminated or revoked sooner.  Performed at [facility], [place] [address], [place],  [address]      Labs: Basic Metabolic Panel: Recent Labs  Lab 03/04/20 0235 03/05/20 0631 03/06/20 0320 03/07/20 0801 03/08/20 0331  NA 131* 130* 133* 131*  133*  K 3.7 4.3 4.0 4.1 3.9  CL 93* 92* 95* 92* 96*  CO2 26 25 26 27 25   GLUCOSE 142* 135* 154* 135* 297*  BUN 42* 58* 32* 49* 28*  CREATININE 7.08* 9.24* 5.59* 7.40* 5.00*  CALCIUM 8.7* 9.0 8.7* 9.3 8.8*  MG 2.1 2.6* 2.0 2.4 2.2  PHOS 6.2* 7.3* 4.7* 6.3* 4.2   Liver Function Tests: Recent Labs  Lab 03/04/20 0235 03/05/20 0631 03/06/20 0320 03/07/20 0801 03/08/20 0331  AST 13* 14* 14* 15 19  ALT 17 17 18 19 21   ALKPHOS 84 90 92 105 122  BILITOT 0.5 0.3 0.6 0.9 0.7  PROT 6.7 7.2 6.8 7.4 6.9  ALBUMIN 2.6* 3.1* 2.9* 2.9* 2.8*   No results for input(s): LIPASE, AMYLASE in the last 168 hours. No results for input(s): AMMONIA in the last 168 hours. CBC: Recent Labs  Lab 03/02/20 0459 03/05/20 0631 03/06/20 0320 03/07/20 0801 03/08/20 0331  WBC 7.6 9.9 8.7 7.4 7.2  NEUTROABS 5.7  --   --   --   --   HGB 8.7* 8.3* 8.0* 8.3* 8.4*  HCT 28.4* 26.9* 26.5* 27.9* 28.1*  MCV 102.5* 101.5* 103.1* 103.0* 104.5*  PLT 153 116* 114* 121* 103*   Cardiac Enzymes: No results for input(s): CKTOTAL, CKMB, CKMBINDEX, TROPONINI in the last 168 hours. BNP: BNP (last 3 results) No results for input(s): BNP in the last 8760 hours.  ProBNP (last 3 results) No results for input(s): PROBNP in the last 8760 hours.  CBG: Recent Labs  Lab 03/06/20 2329  03/07/20 0637 03/07/20 1147 03/07/20 2051 03/08/20 0647  GLUCAP 205* 109* 119* 149* 263*       Signed:  [name] MD   [facility] 03/08/2020, 10:27 AM

## 2020-03-08 NOTE — Plan of Care (Signed)
  Problem: Education: Goal: Knowledge of General Education information will improve Description: Including pain rating scale, medication(s)/side effects and non-pharmacologic comfort measures 03/08/2020 0812 by Melina Schools, RN Outcome: Adequate for Discharge 03/08/2020 [phone] by Melina Schools, RN Outcome: Adequate for Discharge   Problem: Health Behavior/Discharge Planning: Goal: Ability to manage health-related needs will improve 03/08/2020 0812 by Melina Schools, RN Outcome: Adequate for Discharge 03/08/2020 [phone] by Melina Schools, RN Outcome: Adequate for Discharge   Problem: Clinical Measurements: Goal: Ability to maintain clinical measurements within normal limits will improve 03/08/2020 0812 by Melina Schools, RN Outcome: Adequate for Discharge 03/08/2020 [phone] by Melina Schools, RN Outcome: Adequate for Discharge Goal: Will remain free from infection 03/08/2020 0812 by Melina Schools, RN Outcome: Adequate for Discharge 03/08/2020 [phone] by Melina Schools, RN Outcome: Adequate for Discharge Goal: Diagnostic test results will improve 03/08/2020 0812 by Melina Schools, RN Outcome: Adequate for Discharge 03/08/2020 [phone] by Melina Schools, RN Outcome: Adequate for Discharge Goal: Respiratory complications will improve 03/08/2020 0812 by Melina Schools, RN Outcome: Adequate for Discharge 03/08/2020 [phone] by Melina Schools, RN Outcome: Adequate for Discharge Goal: Cardiovascular complication will be avoided 03/08/2020 7062 by Melina Schools, RN Outcome: Adequate for Discharge 03/08/2020 [phone] by Melina Schools, RN Outcome: Adequate for Discharge   Problem: Activity: Goal: Risk for activity intolerance will decrease 03/08/2020 0812 by Melina Schools, RN Outcome: Adequate for Discharge 03/08/2020 [phone] by Melina Schools, RN Outcome: Adequate for Discharge   Problem: Nutrition: Goal: Adequate nutrition will be  maintained 03/08/2020 0812 by Melina Schools, RN Outcome: Adequate for Discharge 03/08/2020 [phone] by Melina Schools, RN Outcome: Adequate for Discharge   Problem: Coping: Goal: Level of anxiety will decrease 03/08/2020 0812 by Melina Schools, RN Outcome: Adequate for Discharge 03/08/2020 [phone] by Melina Schools, RN Outcome: Adequate for Discharge   Problem: Elimination: Goal: Will not experience complications related to bowel motility 03/08/2020 0812 by Melina Schools, RN Outcome: Adequate for Discharge 03/08/2020 [phone] by Melina Schools, RN Outcome: Adequate for Discharge Goal: Will not experience complications related to urinary retention 03/08/2020 0812 by Melina Schools, RN Outcome: Adequate for Discharge 03/08/2020 [phone] by Melina Schools, RN Outcome: Adequate for Discharge   Problem: Pain Managment: Goal: General experience of comfort will improve 03/08/2020 0812 by Melina Schools, RN Outcome: Adequate for Discharge 03/08/2020 [phone] by Melina Schools, RN Outcome: Adequate for Discharge   Problem: Safety: Goal: Ability to remain free from injury will improve 03/08/2020 0812 by Melina Schools, RN Outcome: Adequate for Discharge 03/08/2020 [phone] by Melina Schools, RN Outcome: Adequate for Discharge   Problem: Skin Integrity: Goal: Risk for impaired skin integrity will decrease 03/08/2020 0812 by Melina Schools, RN Outcome: Adequate for Discharge 03/08/2020 [phone] by Melina Schools, RN Outcome: Adequate for Discharge

## 2020-03-08 NOTE — Progress Notes (Signed)
Physical Therapy Treatment Patient Details Name: Suzanne Levy MRN: [id number] DOB: 10-May-1951 Today's Date: 03/08/2020    History of Present Illness Pt is a 69 y.o. F with significant PMH of ESRD on HD, DM2, PE, morbid obesity, asthma, hypertension, osteopneia, who presents to ED with right knee pain after striking it against a railing. Admitted with acute oblique slightly comminuted and slightly displaced fracture extending through the proximal tibial shaft. Plan for conservative treatment.    PT Comments    Pt is bed bound at baseline.  Focused on LE and UE strengthening to improve strength to assist with rolling to place bed pain.  Pt continues to lack ability to assist with rolling.  Positioned patient in modified chair position post session.     Follow Up Recommendations  SNF     Equipment Recommendations  None recommended by PT    Recommendations for Other Services       Precautions / Restrictions Precautions Precautions: Fall;Other (comment) Precaution Comments: legally blind, R hinge brace Other Brace: hinge brace Restrictions Weight Bearing Restrictions: Yes RLE Weight Bearing: Non weight bearing    Mobility  Bed Mobility Overal bed mobility: Needs Assistance Bed Mobility: Rolling Rolling: +2 for physical assistance;Total assist (+3)         General bed mobility comments: Pt remains unable to roll completely into sidelying.  Atempted x1 to the L.  Pt lacks physical capacity to participate to decrease caregiver burden.  Transfers                    Ambulation/Gait                 Stairs             Wheelchair Mobility    Modified Rankin (Stroke Patients Only)       Balance                                            Cognition Arousal/Alertness: Awake/alert Behavior During Therapy: WFL for tasks assessed/performed Overall Cognitive Status: Within Functional Limits for tasks assessed                                  General Comments: Pt initially lethargic but able to be aroused and interactive throughout rest of session      Exercises General Exercises - Lower Extremity Ankle Circles/Pumps: AROM;Left;10 reps;Supine Quad Sets: AROM;Left;10 reps;Supine Short Arc Quad: AROM;Left;10 reps;Supine Heel Slides: AAROM;Left;10 reps;Supine Hip ABduction/ADduction: AAROM;Left;10 reps;Supine Shoulder Exercises Shoulder Flexion: Both;10 reps;AAROM Elbow Flexion: AAROM;Both;10 reps Digit Composite Flexion: AROM;Both;10 reps    General Comments        Pertinent Vitals/Pain Pain Assessment: Faces Pain Score: 8  Pain Location: R LE with movement Pain Descriptors / Indicators: Aching;Discomfort;Grimacing;Guarding Pain Intervention(s): Monitored during session;Repositioned    Home Living                      Prior Function            PT Goals (current goals can now be found in the care plan section) Acute Rehab PT Goals Patient Stated Goal: to have less pain Potential to Achieve Goals: Fair Progress towards PT goals: Progressing toward goals    Frequency    Min 2X/week  PT Plan Current plan remains appropriate    Co-evaluation              AM-PAC PT "6 Clicks" Mobility   Outcome Measure  Help needed turning from your back to your side while in a flat bed without using bedrails?: Total Help needed moving from lying on your back to sitting on the side of a flat bed without using bedrails?: Total Help needed moving to and from a bed to a chair (including a wheelchair)?: Total Help needed standing up from a chair using your arms (e.g., wheelchair or bedside chair)?: Total Help needed to walk in hospital room?: Total Help needed climbing 3-5 steps with a railing? : Total 6 Click Score: 6    End of Session Equipment Utilized During Treatment: Gait belt Activity Tolerance: Patient limited by pain Patient left: in bed;with call bell/phone within  reach;with bed alarm set Nurse Communication: Mobility status PT Visit Diagnosis: Pain;Other abnormalities of gait and mobility (R26.89) Pain - Right/Left: Right Pain - part of body: Leg     Time: 1730-1753 PT Time Calculation (min) (ACUTE ONLY): 23 min  Charges:  $Therapeutic Exercise: 8-22 mins $Therapeutic Activity: 8-22 mins                     Erasmo Leventhal , PTA Acute Rehabilitation Services Pager [phone] Office [phone]     Suzanne Levy Suzanne Levy 03/08/2020, 6:03 PM

## 2020-03-09 LAB — COMPREHENSIVE METABOLIC PANEL
ALT: 18 U/L (ref 0–44)
AST: 14 U/L — ABNORMAL LOW (ref 15–41)
Albumin: 3 g/dL — ABNORMAL LOW (ref 3.5–5.0)
Alkaline Phosphatase: 112 U/L (ref 38–126)
Anion gap: 13 (ref 5–15)
BUN: 42 mg/dL — ABNORMAL HIGH (ref 8–23)
CO2: 24 mmol/L (ref 22–32)
Calcium: 9.4 mg/dL (ref 8.9–10.3)
Chloride: 94 mmol/L — ABNORMAL LOW (ref 98–111)
Creatinine, Ser: 6.91 mg/dL — ABNORMAL HIGH (ref 0.44–1.00)
GFR calc Af Amer: 6 mL/min — ABNORMAL LOW (ref >60)
GFR calc non Af Amer: 6 mL/min — ABNORMAL LOW (ref >60)
Glucose, Bld: 184 mg/dL — ABNORMAL HIGH (ref 70–99)
Potassium: 4 mmol/L (ref 3.5–5.1)
Sodium: 131 mmol/L — ABNORMAL LOW (ref 135–145)
Total Bilirubin: 0.7 mg/dL (ref 0.3–1.2)
Total Protein: 7.4 g/dL (ref 6.5–8.1)

## 2020-03-09 LAB — CBC
HCT: 28.9 % — ABNORMAL LOW (ref 36.0–46.0)
Hemoglobin: 8.5 g/dL — ABNORMAL LOW (ref 12.0–15.0)
MCH: 30.7 pg (ref 26.0–34.0)
MCHC: 29.4 g/dL — ABNORMAL LOW (ref 30.0–36.0)
MCV: 104.3 fL — ABNORMAL HIGH (ref 80.0–100.0)
Platelets: 132 10*3/uL — ABNORMAL LOW (ref 150–400)
RBC: 2.77 MIL/uL — ABNORMAL LOW (ref 3.87–5.11)
RDW: 17.2 % — ABNORMAL HIGH (ref 11.5–15.5)
WBC: 6 10*3/uL (ref 4.0–10.5)
nRBC: 0 % (ref 0.0–0.2)

## 2020-03-09 LAB — GLUCOSE, CAPILLARY
Glucose-Capillary: 192 mg/dL — ABNORMAL HIGH (ref 70–99)
Glucose-Capillary: 179 mg/dL — ABNORMAL HIGH (ref 70–99)
Glucose-Capillary: 64 mg/dL — ABNORMAL LOW (ref 70–99)
Glucose-Capillary: 125 mg/dL — ABNORMAL HIGH (ref 70–99)
Glucose-Capillary: 136 mg/dL — ABNORMAL HIGH (ref 70–99)

## 2020-03-09 LAB — PHOSPHORUS: Phosphorus: 5.4 mg/dL — ABNORMAL HIGH (ref 2.5–4.6)

## 2020-03-09 LAB — MAGNESIUM: Magnesium: 2.4 mg/dL (ref 1.7–2.4)

## 2020-03-09 MED ORDER — CHLORHEXIDINE GLUCONATE CLOTH 2 % EX PADS: 6.0000 | MEDICATED_PAD | Freq: Every day | CUTANEOUS | Status: DC

## 2020-03-09 MED ORDER — DOXERCALCIFEROL 4 MCG/2ML IV SOLN
INTRAVENOUS | Status: AC
  Filled 2020-03-09: qty 2

## 2020-03-09 NOTE — Progress Notes (Signed)
RT placed patient on CPAP HS. 2L O2 bleed in needed. Patient tolerating well.  ?

## 2020-03-09 NOTE — Progress Notes (Signed)
Plan for discharge to OP HD clinic/SW today and then to SNF began yesterday afternoon when it was determined that the only thing holding patient here was the fact that the DME company did not have a CPAP machine available yesterday, and therefore patient had to stay another day. Navigator, CM and TOC supervisor brainstormed and made plan for patient to be transported by [facility] from [facility] to SW with a 10:30-11:00am pick up window Friday morning and then to [facility] from [place] HD clinic with a 5:00-5:30 pick up window. SW clinic updated that patient will be there for treatment today and Renal PA asked to send orders. Unfortunately, patient was not at the main entrance until 10:50am for pick-up and when Navigator called [facility], learned that they had arrived at 10:33am and considered her a "cancellation at the door." Navigator asked patient to call her caregiver to see if she could transport her to SW, but she was unable. Navigator attempted to utilize [facility] transportation, but did not have enough time to arrange by this point.  Patient had to be taken back to her room on 5N29 and await HD here before discharge to SNF. Navigator contacted HD manager and requested that patient be taken for treatment as soon as possible given all she has been through this morning. Navigator cancelled [facility] trip from SW to SNF this evening. CM has arranged for [facility] to SNF after HD and caregiver will come pick up patient's wheelchair this evening and take it to SNF. SW clinic updated that patient will not be there for treatment today.  Alphonzo Cruise, [facility] Navigator [phone]

## 2020-03-09 NOTE — Progress Notes (Addendum)
[facility] Progress Note   Dialysis Orders: AF MWF 4.25 500/800 EDW 156 kg  2K /2.25 Ca AV graft NO heparin  Note that Coumadin is not on her outpatient medication list  Meds: mircera 150 mcg every 2 weeks; last given on 7/19  hectorol 2 mcg three times a week  On 7/19 left at 165.9 kg and on 7/20 left at 161.5 kg  Assessment/Plan: 1.R tibial Frx- per ortho. Plans for conservative management. Pain control per primary. Nonambulatory prior to fx with electric WC - she said rehab adm planned - given hallucinations -do not believe she is safe for d/c ? Pain med related - consider using short term Toradol or NSAID She has been a hoyer transfer up to this point. I believe hoyer transfers will be painful. 2. ESRD- on MWF. HD today - orders written for inpatient unless primary feels she is ok for d/c  3. Anemiaof CKD-Hgb 8.4.next dose ESA due 8/2. Tsat 22%, Ferratin >7500 likely d/t inflammatory response. 4. Secondary hyperparathyroidism- Caand phos in goal.Continue VDRA and binders.  5.Hypotension/volume- chronic hypotension, on midodrine. 10kg over edw per bed weights, doubt the accuracy. Has sig LLE edema.  Not sure what weight of brace contribute.   Net UF 4L removed Wed UF limited by  hypotension. 6. Nutrition- Renal diet w/fluid restrictions.  7. DM- per primary 8. Dispo -plans to d/c to SNF.  9. Hx PE - on eliquis 10. OSA - on CPAP 11. Thrombocytopenia - follow  Suzanne Jacobson, PA-C [facility] Beeper [phone] 03/09/2020,8:07 AM  LOS: 10 days   Subjective:   Is seeing other people in room who are not present - some are family members. She is legally blind  Oriented x 3. RLE hurts with any movement  Objective Vitals:   03/08/20 0824 03/08/20 1446 03/08/20 2018 03/09/20 0328  BP: (!) 105/90 (!) 93/53 (!) 114/59 (!) 134/60  Pulse: 68 64 68 54  Resp: 18 18 18 18   Temp: 98.6 F (37 C) 98.1 F (36.7 C) 98.7 F (37.1 C) 97.9 F  (36.6 C)  TempSrc: Oral Oral Oral Oral  SpO2: 98% 96% 91% 100%  Weight:      Height:       Physical Exam General: slightly anxious NAD Heart: RRR Lungs: no rales - somewhat limited to body habitus Abdomen: obese soft NT Extremities: RLE wrapped with brace LEE ++ edema Dialysis Access: left upper AVGG + bruit   Additional Objective Labs: Basic Metabolic Panel: Recent Labs  Lab 03/06/20 0320 03/07/20 0801 03/08/20 0331  NA 133* 131* 133*  K 4.0 4.1 3.9  CL 95* 92* 96*  CO2 26 27 25   GLUCOSE 154* 135* 297*  BUN 32* 49* 28*  CREATININE 5.59* 7.40* 5.00*  CALCIUM 8.7* 9.3 8.8*  PHOS 4.7* 6.3* 4.2   Liver Function Tests: Recent Labs  Lab 03/06/20 0320 03/07/20 0801 03/08/20 0331  AST 14* 15 19  ALT 18 19 21   ALKPHOS 92 105 122  BILITOT 0.6 0.9 0.7  PROT 6.8 7.4 6.9  ALBUMIN 2.9* 2.9* 2.8*   No results for input(s): LIPASE, AMYLASE in the last 168 hours. CBC: Recent Labs  Lab 03/05/20 0631 03/05/20 0631 03/06/20 0320 03/07/20 0801 03/08/20 0331  WBC 9.9   < > 8.7 7.4 7.2  HGB 8.3*   < > 8.0* 8.3* 8.4*  HCT 26.9*   < > 26.5* 27.9* 28.1*  MCV 101.5*  --  103.1* 103.0* 104.5*  PLT 116*   < >  114* 121* 103*   < > = values in this interval not displayed.   Blood Culture No results found for: SDES, SPECREQUEST, CULT, REPTSTATUS  Cardiac Enzymes: No results for input(s): CKTOTAL, CKMB, CKMBINDEX, TROPONINI in the last 168 hours. CBG: Recent Labs  Lab 03/07/20 2051 03/08/20 0647 03/08/20 1128 03/08/20 1605 03/08/20 2143  GLUCAP 149* 263* 198* 136* 233*   Iron Studies: No results for input(s): IRON, TIBC, TRANSFERRIN, FERRITIN in the last 72 hours. Lab Results  Component Value Date   INR 1.6 (H) 02/29/2020   INR 1.4 (H) 02/29/2020   INR 3.00 09/16/2016   Studies/Results: No results found. Medications:  . acetaminophen  1,000 mg Oral Q8H  . apixaban  5 mg Oral BID  . calcium carbonate  1,250 mg Oral Q breakfast  . carvedilol  12.5 mg Oral BID  WC  . Chlorhexidine Gluconate Cloth  6 each Topical Q0600  . clobetasol ointment  1 application Topical BID  . doxercalciferol  2 mcg Intravenous Q M,W,F-HD  . gabapentin  300 mg Oral TID  . insulin aspart  0-15 Units Subcutaneous TID WC  . insulin aspart  0-5 Units Subcutaneous QHS  . insulin NPH Human  20 Units Subcutaneous QHS  . insulin NPH Human  36 Units Subcutaneous QAC breakfast  . midodrine  10 mg Oral BID WC  . montelukast  10 mg Oral QHS  . sevelamer carbonate  800 mg Oral TID WC  . traMADol  50-100 mg Oral Q6H

## 2020-03-09 NOTE — Progress Notes (Signed)
Pt to [facility] via [facility]. VSS. Pt transferred to stretcher via hoyer lift. Hinge brace on right leg. Updates were called to nurse in [facility].

## 2020-03-09 NOTE — Progress Notes (Signed)
Patient briefly seen and examined and agree with prior plan of care Discharge summary has been updated and addended for discharge date 7/30 no overt changes to the plan of care Hopefully she can stay out of the hospital  Verneita Griffes, MD [facility] 11:28 AM

## 2020-03-09 NOTE — Discharge Summary (Signed)
Physician Discharge Summary  Suzanne Levy IOX:[id number] DOB: 1951/07/13 DOA: 02/28/2020  PCP: [name], MD  Admit date: 02/28/2020 Discharge date: 03/09/2020  Time spent: 35 minutes  No changes to discharge summary patient stabilized for discharge 7/30-please see orders scripts have been signed  Recommendations for Outpatient Follow-up:  1. Tylenol first choice, tramadol second choice for more severe pain-has intolerances to oxycodone NOT allergies 2. Caution with pain meds to given sedative effects in the setting of renal insufficiency 3. Requires Chem-12, CBC in 1 week 4. Should follow-up with orthopedics [name] in 4 to 6 weeks-nursing home physician to kindly arrange 5. Will need wheelchair 6. Will need bariatric bed at facility 7. Will be scheduled by dialysis center for HD on usual Monday Wednesday Friday basis 8. Recommend iron studies in about 3 weeks 9. Recommend phosphorus checks in 1 to 2 weeks with labs 10. 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[place])   Discharge Condition: Fair  Diet recommendation: Diabetic hypertensive  Filed Weights   03/05/20 1348 03/05/20 1820 03/07/20 1243  Weight: (!) 170.5 kg (!) 166.5 kg (!) 171 kg    History of present illness:  69 year old morbidly obese BMI 54 African-American ESRD MWF [place] DM TY 2 neuropathy nephropathy PE on warfarin HTN OSA on CPAP Has not walked in 7 years uses a motorized wheelchair at home Admitted 7/20  secondary to striking right lower extremity near railing of stairs of her apartment building Found to have acute obliquely comminuted displaced proximal tibial shaft fracture Dr. [name] was consulted who recommended nonoperative management  Hospital Course:  1. Right tibia fracture 2. Right knee laceration a. Appreciate Dr. [name] thoughtfulness-needs outpatient follow-up with him in 2 to 4 weeks please arrange b. Nonoperative candidate-BMI of 52 as well as poor quality of fibula would not support hardware c. She is intolerant to opiates other than tramadol therefore increase tramadol to 50-100 4 times daily currently on gabapentin 300 3 times daily d. Caution with sedation given she is a dialysis patient on various meds with propensity for the same 3. ESRD MWF 4. Renal osteodystrophy a. Defer to nephrology-will be discharged to skilled facility and continue MWF dialysis b. continue Hectorol 2 mcg Monday Wednesday Friday, Renvela 800 3 times daily-may need to increase dosing 5. Chronic hypotension a. Difficult situation-do not think her blood pressures are accurate given her habitus continue midodrine 10 twice daily b. Consider dose her Coreg on nondialysis days and change from 12.5 twice daily to 12.5 on nondialysis days if trends of blood pressure continued to remain low 6. Macrocytic anemia a. Unclear etiology significant RDW as well as elevation in MCV b. Outpatient work-up 7. DM TY 2 A1c 7 a. Blood sugars well controlled during hospital stay b. Continue NPH 30 6 AM, 20 units at bedtime with moderate sliding scale c. [At home also takes 14 units NovoLog a.m. and p.m. ] which was resumed during hospital stay and on discharge 8. Sleep apnea not on CPAP 9. BMI >50 10. Nonambulatory  for 7 years a. Difficult situation-mortality is increased in these patients with OHSS were noncompliant- b. Needs outpatient pulmonology revisit 11. Right flank laceration secondary to skin biopsy a. Wound  reviewed by me on day of discharge 7/29 5X 1 cm on right flank and clean b. Needs wet-to-dry dressings 12. Prior pulmonary embolism on Eliquis a. Continue the same needs lifelong management 13. Severe osteopenia secondary to renal osteodystrophy 14. Prior hyperkalemia is now resolved  Procedures: None  Consultations:  [name] of orthopedics  Discharge Exam: Vitals:   03/09/20 0847 03/09/20 0850  BP: (!) 83/56 99/76  Pulse: 64 64  Resp: 16   Temp: 98.4 F (36.9 C)   SpO2: 97%     General: Obese awake oriented thick neck Mallampati 4 Cardiovascular: S1-S2 no murmur rub gallop Respiratory: Chest clear Right flank shows wound as above 5 x 1 cm clean base margins without any slough Right leg is in knee immobilizer and has wrapped dressings Left leg is nonedematous but rather has tissue fluid Abdomen is obese precluding exam  Discharge Instructions   Discharge Instructions    Diet - low sodium heart healthy   Complete by: As directed    Diet - low sodium heart healthy   Complete by: As directed    Discharge wound care:   Complete by: As directed    Active Pressure Injury/Wound(s)    Wound         Wound / Incision (Open or Dehisced) 02/29/20 Other (Comment) Sacrum Mid nonblanchable pressure ulcer 7 days   Wound / Incision (Open or Dehisced) 03/02/20 Other (Comment) Flank Right skin tag excision site 4 days     Wound Care Orders (From admission, onward)    Start     Ordered   03/03/20 0500   Wound care  Daily      Comments: Wound care to right flank, wet to dry dressing once a day.  03/02/20 1710   02/29/20 0500   Wound care  Daily       02/29/20 0131   Discharge wound care:   Complete by: As directed    Active Pressure Injury/Wound(s)    Wound         Wound / Incision (Open or Dehisced) 02/29/20 Other (Comment) Sacrum Mid nonblanchable pressure ulcer 8 days   Wound / Incision (Open or Dehisced) 03/02/20 Other (Comment) Flank Right  skin tag excision site   Increase activity slowly   Complete by: As directed    Increase activity slowly   Complete by: As directed      Allergies as of 03/09/2020      Reactions   Enoxaparin Other (See Comments)   unknown   Lovenox [enoxaparin Sodium] Other (See Comments)   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 Allergen: BLOOD THINNERS, Other Reaction: eye hemorrhage, seafood, (can eat salmon), acid drinks like orange juice,grape fruitt juice   Ace Inhibitors Other (See Comments)   ESRD   Aspirin Other (See Comments)   Eye hemorrhage   Buprenorphine Hcl Other (See Comments)   unknown   Codeine Itching   Duloxetine Other (See Comments)   unknown   Gentamicin Other (See Comments)   unknown   Heparin Other (See Comments)   "depletes something in my system"   Hydrocodone Itching   Morphine And Related Other (See Comments)   unknown   Oxycodone Itching   Penicillins Other (  See Comments)   unknown   Vancomycin Other (See Comments)   unknown   Cephalexin Rash   Cephalosporins Rash   Tomato Rash      Medication List    STOP taking these medications   ferric citrate 1 GM 210 MG(Fe) tablet Commonly known as: AURYXIA     TAKE these medications   acetaminophen 500 MG tablet Commonly known as: TYLENOL Take 1,000 mg by mouth 3 (three) times daily.   albuterol 108 (90 Base) MCG/ACT inhaler Commonly known as: VENTOLIN HFA Inhale 2 puffs into the lungs every 4 (four) hours as needed for wheezing or shortness of breath.   ascorbic acid 1000 MG tablet Commonly known as: VITAMIN C Take 1,000 mg by mouth 2 (two) times daily.   calcium carbonate 1250 (500 Ca) MG tablet Commonly known as: OS-CAL - dosed in mg of elemental calcium Take 1 tablet (1,250 mg total) by mouth daily with breakfast.   carvedilol 12.5 MG tablet Commonly known as: COREG Take 12.5 mg by mouth 2 (two) times daily with a  meal.   doxercalciferol 4 MCG/2ML injection Commonly known as: HECTOROL Inject 1 mL (2 mcg total) into the vein every Monday, Wednesday, and Friday with hemodialysis.   Eliquis 5 MG Tabs tablet Generic drug: apixaban Take 5 mg by mouth 2 (two) times daily.   eucerin cream Apply 1 application topically 2 (two) times daily.   fluticasone 50 MCG/ACT nasal spray Commonly known as: FLONASE Place 1 spray into both nostrils daily as needed for allergies.   folic acid 1 MG tablet Commonly known as: FOLVITE Take 1 mg by mouth daily.   gabapentin 100 MG capsule Commonly known as: NEURONTIN Take 200 mg by mouth 3 (three) times daily.   hydrOXYzine 25 MG tablet Commonly known as: ATARAX/VISTARIL Take 25 mg by mouth 3 (three) times daily.   insulin aspart 100 UNIT/ML injection Commonly known as: novoLOG Inject 14 Units into the skin in the morning and at bedtime.   insulin NPH Human 100 UNIT/ML injection Commonly known as: NOVOLIN N Inject 20-36 Units into the skin. Inject 36 units in the morning and 20 units at night.   ketoconazole 2 % cream Commonly known as: NIZORAL Apply 1 application topically 2 (two) times daily.   lanolin ointment Apply 1 application topically 2 (two) times daily.   midodrine 10 MG tablet Commonly known as: PROAMATINE Take 1 tablet (10 mg total) by mouth 2 (two) times daily with a meal. What changed: when to take this   montelukast 10 MG tablet Commonly known as: SINGULAIR Take 10 mg by mouth daily.   multivitamin Tabs tablet Take 1 tablet by mouth daily.   pantoprazole 40 MG tablet Commonly known as: PROTONIX Take 40 mg by mouth daily.   polyethylene glycol 17 g packet Commonly known as: MIRALAX / GLYCOLAX Take 17 g by mouth 2 (two) times daily.   pravastatin 40 MG tablet Commonly known as: PRAVACHOL Take 40 mg by mouth daily.   senna 8.6 MG Tabs tablet Commonly known as: SENOKOT Take 1 tablet by mouth 3 (three) times daily.    sevelamer carbonate 800 MG tablet Commonly known as: RENVELA Take 800 mg by mouth 3 (three) times daily with meals.   traMADol 50 MG tablet Commonly known as: ULTRAM Take 1-2 tablets (50-100 mg total) by mouth 3 (three) times daily as needed for up to 7 days. What changed: how much to take   traMADol 50 MG tablet Commonly known  as: ULTRAM Take 1-2 tablets (50-100 mg total) by mouth every 6 (six) hours. What changed: You were already taking a medication with the same name, and this prescription was added. Make sure you understand how and when to take each.            Discharge Care Instructions  (From admission, onward)         Start     Ordered   03/08/20 0000  Discharge wound care:       Comments: Active Pressure Injury/Wound(s)    Wound         Wound / Incision (Open or Dehisced) 02/29/20 Other (Comment) Sacrum Mid nonblanchable pressure ulcer 8 days   Wound / Incision (Open or Dehisced) 03/02/20 Other (Comment) Flank Right skin tag excision site   03/08/20 1012   03/07/20 0000  Discharge wound care:       Comments: Active Pressure Injury/Wound(s)    Wound         Wound / Incision (Open or Dehisced) 02/29/20 Other (Comment) Sacrum Mid nonblanchable pressure ulcer 7 days   Wound / Incision (Open or Dehisced) 03/02/20 Other (Comment) Flank Right skin tag excision site 4 days     Wound Care Orders (From admission, onward)    Start     Ordered   03/03/20 0500   Wound care  Daily      Comments: Wound care to right flank, wet to dry dressing once a day.  03/02/20 1710   02/29/20 0500   Wound care  Daily       02/29/20 0131   03/07/20 1113         Allergies  Allergen Reactions  . Enoxaparin Other (See Comments)    unknown  . 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 Other Other (See Comments)    Uncoded Allergy. Allergen: BLOOD THINNERS, Other Reaction: eye  hemorrhage, seafood, (can eat salmon), acid drinks like orange juice,grape fruitt juice  . Ace Inhibitors Other (See Comments)    ESRD   . Aspirin Other (See Comments)    Eye hemorrhage  . Buprenorphine Hcl Other (See Comments)    unknown  . Codeine Itching  . Duloxetine Other (See Comments)    unknown  . Gentamicin Other (See Comments)    unknown  . Heparin Other (See Comments)    "depletes something in my system"  . Hydrocodone Itching  . Morphine And Related Other (See Comments)    unknown  . Oxycodone Itching  . Penicillins Other (See Comments)    unknown  . Vancomycin Other (See Comments)    unknown  . Cephalexin Rash  . Cephalosporins Rash  . Tomato Rash    Follow-up Information    Marybelle Killings, MD Follow up in 2 week(s).   Specialty: Orthopedic Surgery Contact information: [place] [address] [phone]                The results of significant diagnostics from this hospitalization (including imaging, microbiology, ancillary and laboratory) are listed below for reference.    Significant Diagnostic Studies: DG Tibia/Fibula Right  Result Date: 02/28/2020 CLINICAL DATA:  Initial evaluation for acute pain status post injury. EXAM: RIGHT TIBIA AND FIBULA - 2 VIEW COMPARISON:  None. FINDINGS: Examination technically limited by positioning and underlying severe osteopenia. Single fixation screw traverses the proximal tibial shaft. Just inferiorly, there is an acute  oblique slightly comminuted and slightly displaced fracture extending through the proximal tibial shaft. Additional possible subtle cortical irregularity at the right fibular head/neck, which could reflect an additional subtle nondisplaced fracture. No other definite acute osseous abnormality. Advanced osteoarthritic changes noted about the knee and ankle. No visible soft tissue injury. Extensive vascular calcifications noted. IMPRESSION: 1. Acute oblique slightly comminuted and slightly  displaced fracture extending through the proximal tibial shaft. 2. Additional possible subtle cortical irregularity at the right fibular head/neck, which could reflect an additional subtle nondisplaced fracture. Correlation with physical exam recommended. 3. Underlying severe osteopenia. Electronically Signed   By: Jeannine Boga M.D.   On: 02/28/2020 21:22   DG Ankle Complete Right  Result Date: 02/28/2020 CLINICAL DATA:  Initial evaluation for acute pain status post injury. EXAM: RIGHT ANKLE - COMPLETE 3+ VIEW COMPARISON:  None. FINDINGS: Examination is technically limited by patient positioning. Additionally, severe osteopenia limits evaluation for possible subtle acute nondisplaced fractures. No definite acute fracture or dislocation. Ankle mortise approximated. No definite abnormality about the visualized foot. No visible soft tissue injury. Extensive vascular calcifications noted. IMPRESSION: Technically limited study due to patient positioning and severe osteopenia. No definite acute osseous abnormality about the right ankle. Electronically Signed   By: Jeannine Boga M.D.   On: 02/28/2020 21:11   DG Knee Complete 4 Views Right  Result Date: 02/28/2020 CLINICAL DATA:  Initial evaluation for acute pain status post injury. EXAM: RIGHT KNEE - COMPLETE 4+ VIEW COMPARISON:  None. FINDINGS: Examination technically limited by patient positioning and severe osteopenia. Single fixation screw traverses the proximal tibia. There is an acute oblique slightly comminuted and displaced fracture extending through the proximal right tibial shaft. Question additional subtle cortical irregularity at the right fibular head/neck, which could reflect an additional nondisplaced fracture. No other definite acute osseous abnormality. Visualized distal femur intact. Patella not well visualized. Severe osteoarthritic changes present about the knee. No obvious or visible joint effusion. Extensive vascular  calcifications seen throughout the visualized soft tissues. IMPRESSION: 1. Acute oblique slightly comminuted and displaced fracture extending through the proximal right tibial shaft. 2. Question additional subtle cortical irregularity at the right fibular head/neck, which could reflect an additional nondisplaced fracture. Correlation with physical exam recommended. 3. Underlying severe osteopenia with advanced osteoarthritic changes about the right knee. Electronically Signed   By: Jeannine Boga M.D.   On: 02/28/2020 21:19    Microbiology: Recent Results (from the past 240 hour(s))  SARS Coronavirus 2 by RT PCR (hospital order, performed in [facility] hospital lab) Nasopharyngeal Nasopharyngeal Swab     Status: None   Collection Time: 02/28/20 11:23 PM   Specimen: Nasopharyngeal Swab  Result Value Ref Range Status   SARS Coronavirus 2 NEGATIVE NEGATIVE Final    Comment: (NOTE) SARS-CoV-2 target nucleic acids are NOT DETECTED.  The SARS-CoV-2 RNA is generally detectable in upper and lower respiratory specimens during the acute phase of infection. The lowest concentration of SARS-CoV-2 viral copies this assay can detect is 250 copies / mL. A negative result does not preclude SARS-CoV-2 infection and should not be used as the sole basis for treatment or other patient management decisions.  A negative result may occur with improper specimen collection / handling, submission of specimen other than nasopharyngeal swab, presence of viral mutation(s) within the areas targeted by this assay, and inadequate number of viral copies (<250 copies / mL). A negative result must be combined with clinical observations, patient history, and epidemiological information.  Fact Sheet for Patients:  StrictlyIdeas.no  Fact Sheet for Healthcare Providers: BankingDealers.co.za  This test is not yet approved or  cleared by the [place] FDA and has been  authorized for detection and/or diagnosis of SARS-CoV-2 by FDA under an Emergency Use Authorization (EUA).  This EUA will remain in effect (meaning this test can be used) for the duration of the COVID-19 declaration under Section 564(b)(1) of the Act, 21 U.S.C. section 360bbb-3(b)(1), unless the authorization is terminated or revoked sooner.  Performed at [facility], [place] [address] [place], [place] [address]   SARS CORONAVIRUS 2 (TAT 6-24 HRS) Nasopharyngeal Nasopharyngeal Swab     Status: None   Collection Time: 03/07/20 10:29 AM   Specimen: Nasopharyngeal Swab  Result Value Ref Range Status   SARS Coronavirus 2 NEGATIVE NEGATIVE Final    Comment: (NOTE) SARS-CoV-2 target nucleic acids are NOT DETECTED.  The SARS-CoV-2 RNA is generally detectable in upper and lower respiratory specimens during the acute phase of infection. Negative results do not preclude SARS-CoV-2 infection, do not rule out co-infections with other pathogens, and should not be used as the sole basis for treatment or other patient management decisions. Negative results must be combined with clinical observations, patient history, and epidemiological information. The expected result is Negative.  Fact Sheet for Patients: SugarRoll.be  Fact Sheet for Healthcare Providers: https://www.woods-mathews.com/  This test is not yet approved or cleared by the [place] FDA and  has been authorized for detection and/or diagnosis of SARS-CoV-2 by FDA under an Emergency Use Authorization (EUA). This EUA will remain  in effect (meaning this test can be used) for the duration of the COVID-19 declaration under Se ction 564(b)(1) of the Act, 21 U.S.C. section 360bbb-3(b)(1), unless the authorization is terminated or revoked sooner.  Performed at [facility], [place] [address] [place], [place] [address]      Labs: Basic Metabolic Panel: Recent Labs   Lab 03/05/20 0631 03/06/20 0320 03/07/20 0801 03/08/20 0331 03/09/20 1017  NA 130* 133* 131* 133* 131*  K 4.3 4.0 4.1 3.9 4.0  CL 92* 95* 92* 96* 94*  CO2 25 26 27 25 24   GLUCOSE 135* 154* 135* 297* 184*  BUN 58* 32* 49* 28* 42*  CREATININE 9.24* 5.59* 7.40* 5.00* 6.91*  CALCIUM 9.0 8.7* 9.3 8.8* 9.4  MG 2.6* 2.0 2.4 2.2 2.4  PHOS 7.3* 4.7* 6.3* 4.2 5.4*   Liver Function Tests: Recent Labs  Lab 03/05/20 0631 03/06/20 0320 03/07/20 0801 03/08/20 0331 03/09/20 1017  AST 14* 14* 15 19 14*  ALT 17 18 19 21 18   ALKPHOS 90 92 105 122 112  BILITOT 0.3 0.6 0.9 0.7 0.7  PROT 7.2 6.8 7.4 6.9 7.4  ALBUMIN 3.1* 2.9* 2.9* 2.8* 3.0*   No results for input(s): LIPASE, AMYLASE in the last 168 hours. No results for input(s): AMMONIA in the last 168 hours. CBC: Recent Labs  Lab 03/05/20 0631 03/06/20 0320 03/07/20 0801 03/08/20 0331 03/09/20 1017  WBC 9.9 8.7 7.4 7.2 6.0  HGB 8.3* 8.0* 8.3* 8.4* 8.5*  HCT 26.9* 26.5* 27.9* 28.1* 28.9*  MCV 101.5* 103.1* 103.0* 104.5* 104.3*  PLT 116* 114* 121* 103* 132*   Cardiac Enzymes: No results for input(s): CKTOTAL, CKMB, CKMBINDEX, TROPONINI in the last 168 hours. BNP: BNP (last 3 results) No results for input(s): BNP in the last 8760 hours.  ProBNP (last 3 results) No results for input(s): PROBNP in the last 8760 hours.  CBG: Recent Labs  Lab 03/08/20 1128 03/08/20  1605 03/08/20 2143 03/09/20 0654 03/09/20 0835  GLUCAP 198* 136* 233* 192* 179*       Signed:  [name] MD   [facility] 03/09/2020, 11:28 AM

## 2020-03-09 NOTE — Plan of Care (Signed)

## 2020-03-09 NOTE — Progress Notes (Signed)
Called facility and gave report to Skyler B. Endorsed accordingly to oncoming RN.

## 2020-03-09 NOTE — Care Management Important Message (Signed)
Important Message  Patient Details  Name: Suzanne Levy MRN: [id number] Date of Birth: [birth year]   Medicare Important Message Given:  Yes     Antwyne Pingree [address] 03/09/2020, 8:35 AM

## 2020-03-09 NOTE — Progress Notes (Signed)
Patient back from dialysis. Dinner has been order and medications given. Patients caregiver has come to pick up patients motorized wheelchair. [facility] has been called. Waiting for transportation. Patient has no complaints at this time.

## 2020-03-09 NOTE — TOC Transition Note (Signed)
Transition of Care [facility]) - CM/SW Discharge Note   Patient Details  Name: Suzanne Levy MRN: [id number] Date of Birth: 04/04/1951  [facility] CM/SW Contact:  Curlene Labrum, RN Phone Number: 03/09/2020, 9:43 AM   Clinical Narrative:    Case management spoke with Barbette Or, [place] director, and patient was accepted for outpatient dialysis this morning at 12 noon and will be discharging from the hospital via electric wheelchair to the [facility], who will pick her up at the hospital.  I notified the patient this morning and the primary nurse, Roj, RN - who will call report to [facility] at [phone] Room 103.  The patient was transported with her CPAP mask, belongings and transfer packet including her prescription signed by Dr. Verlon Au.   Final next level of care: Skilled Nursing Facility Barriers to Discharge: Continued Medical Work up   Patient Goals and CMS Choice Patient states their goals for this hospitalization and ongoing recovery are:: To skilled nursing facility with HD transportation and support CMS Medicare.gov Compare Post Acute Care list provided to:: Patient Choice offered to / list presented to : Patient  Discharge Placement                       Discharge Plan and Services   Discharge Planning Services: CM Consult Post Acute Care Choice: [place]                               Social Determinants of Health (SDOH) Interventions     Readmission Risk Interventions Readmission Risk Prevention Plan 03/01/2020  Transportation Screening Complete  Medication Review ([place]) Complete  PCP or Specialist appointment within 3-5 days of discharge Complete  HRI or Home Care Consult Complete  SW Recovery Care/Counseling Consult Complete  Palliative Care Screening Complete  Skilled Nursing Facility Complete  Some recent data might be hidden

## 2020-03-09 NOTE — Progress Notes (Signed)
Hypoglycemic Event  CBG: 64  Treatment: 2 containers of Orange juice   Symptoms: Asymptomatic per patient   Follow-up CBG: 1933 CBG Result: 125  Possible Reasons for Event: Has not eaten for awhile    Comments/MD notified: Oncoming RN aware.     [name]  [name]

## 2020-03-09 NOTE — TOC Transition Note (Signed)
Transition of Care [facility]) - CM/SW Discharge Note   Patient Details  Name: Suzanne Levy MRN: [id number] Date of Birth: 1950-08-12  Transition of Care [facility]) CM/SW Contact:  Curlene Labrum, RN Phone Number: 03/09/2020, 11:48 AM   Clinical Narrative:     The patient was unable to obtain her ride with [facility] to the dialysis center at 1030 today.  Terri Piedra spoke with Verdene Lennert, RN at dialysis at inpatient unit and patient is scheduled to have her dialysis here at [facility] and then transfer by [facility] this evening over to [facility].  I called the aide, Tammy at [phone] and she is going to pick up the patient's chair this evening and drive it over to [facility] center tonight.  I called [facility] and spoke with Juliann Pulse, [facility] and she is aware that the patient will be arriving at the facility tonight via [place].  Will continue to follow for discharge.  Final next level of care: Skilled Nursing Facility Barriers to Discharge: Continued Medical Work up   Patient Goals and CMS Choice Patient states their goals for this hospitalization and ongoing recovery are:: To skilled nursing facility with HD transportation and support CMS Medicare.gov Compare Post Acute Care list provided to:: Patient Choice offered to / list presented to : Patient  Discharge Placement                       Discharge Plan and Services   Discharge Planning Services: CM Consult Post Acute Care Choice: [place]                               Social Determinants of Health (SDOH) Interventions     Readmission Risk Interventions Readmission Risk Prevention Plan 03/01/2020  Transportation Screening Complete  Medication Review ([place]) Complete  PCP or Specialist appointment within 3-5 days of discharge Complete  HRI or Home Care Consult Complete  SW Recovery Care/Counseling Consult Complete  Palliative Care Screening Complete   Skilled Nursing Facility Complete  Some recent data might be hidden

## 2020-03-27 ENCOUNTER — Ambulatory Visit (INDEPENDENT_AMBULATORY_CARE_PROVIDER_SITE_OTHER): Payer: Medicare Other | Admitting: Orthopaedic Surgery

## 2020-03-27 ENCOUNTER — Ambulatory Visit (INDEPENDENT_AMBULATORY_CARE_PROVIDER_SITE_OTHER): Payer: Medicare Other

## 2020-03-27 DIAGNOSIS — S82191G Other fracture of upper end of right tibia, subsequent encounter for closed fracture with delayed healing: Secondary | ICD-10-CM

## 2020-03-27 DIAGNOSIS — M1711 Unilateral primary osteoarthritis, right knee: Secondary | ICD-10-CM

## 2020-03-27 NOTE — Progress Notes (Signed)
69-year-old female returns with her severe morbid obesity severe diabetes nonambulatory in a power wheelchair.  She is legally blind.  Bledsoe brace had been on a slid down some pressing on the foot.  Braces removed readjusted.  She still has motion at the fracture site less than 02/28/2020.  Assessment tibial fracture.  Plan continue Bledsoe brace continue nonweightbearing return 4 weeks repeat x-rays .

## 2020-04-04 ENCOUNTER — Other Ambulatory Visit: Payer: Self-pay

## 2020-04-04 ENCOUNTER — Emergency Department (HOSPITAL_COMMUNITY)
Admission: EM | Admit: 2020-04-04 | Discharge: 2020-04-05 | Disposition: A | Discharge status: Home / Self Care | Payer: Medicare Other | Attending: Emergency Medicine | Admitting: Emergency Medicine

## 2020-04-04 ENCOUNTER — Ambulatory Visit: Payer: Self-pay | Admitting: *Deleted

## 2020-04-04 ENCOUNTER — Emergency Department (HOSPITAL_COMMUNITY): Payer: Medicare Other

## 2020-04-04 ENCOUNTER — Encounter (HOSPITAL_COMMUNITY): Payer: Self-pay | Admitting: Emergency Medicine

## 2020-04-04 DIAGNOSIS — M791 Myalgia, unspecified site: Secondary | ICD-10-CM | POA: Insufficient documentation

## 2020-04-04 DIAGNOSIS — N186 End stage renal disease: Secondary | ICD-10-CM | POA: Insufficient documentation

## 2020-04-04 DIAGNOSIS — E1122 Type 2 diabetes mellitus with diabetic chronic kidney disease: Secondary | ICD-10-CM | POA: Diagnosis not present

## 2020-04-04 DIAGNOSIS — Z20822 Contact with and (suspected) exposure to covid-19: Secondary | ICD-10-CM | POA: Diagnosis not present

## 2020-04-04 DIAGNOSIS — L03115 Cellulitis of right lower limb: Secondary | ICD-10-CM

## 2020-04-04 DIAGNOSIS — Z7951 Long term (current) use of inhaled steroids: Secondary | ICD-10-CM | POA: Insufficient documentation

## 2020-04-04 DIAGNOSIS — Z794 Long term (current) use of insulin: Secondary | ICD-10-CM | POA: Insufficient documentation

## 2020-04-04 DIAGNOSIS — Z992 Dependence on renal dialysis: Secondary | ICD-10-CM | POA: Insufficient documentation

## 2020-04-04 DIAGNOSIS — J45909 Unspecified asthma, uncomplicated: Secondary | ICD-10-CM | POA: Insufficient documentation

## 2020-04-04 DIAGNOSIS — R109 Unspecified abdominal pain: Secondary | ICD-10-CM

## 2020-04-04 DIAGNOSIS — Z7901 Long term (current) use of anticoagulants: Secondary | ICD-10-CM | POA: Diagnosis not present

## 2020-04-04 DIAGNOSIS — Z79899 Other long term (current) drug therapy: Secondary | ICD-10-CM | POA: Diagnosis not present

## 2020-04-04 DIAGNOSIS — I120 Hypertensive chronic kidney disease with stage 5 chronic kidney disease or end stage renal disease: Secondary | ICD-10-CM | POA: Insufficient documentation

## 2020-04-04 DIAGNOSIS — K5900 Constipation, unspecified: Secondary | ICD-10-CM | POA: Diagnosis not present

## 2020-04-04 DIAGNOSIS — E1165 Type 2 diabetes mellitus with hyperglycemia: Secondary | ICD-10-CM | POA: Diagnosis not present

## 2020-04-04 LAB — COMPREHENSIVE METABOLIC PANEL
ALT: 15 U/L (ref 0–44)
AST: 15 U/L (ref 15–41)
Albumin: 2.5 g/dL — ABNORMAL LOW (ref 3.5–5.0)
Alkaline Phosphatase: 132 U/L — ABNORMAL HIGH (ref 38–126)
Anion gap: 15 (ref 5–15)
BUN: 47 mg/dL — ABNORMAL HIGH (ref 8–23)
CO2: 29 mmol/L (ref 22–32)
Calcium: 9.2 mg/dL (ref 8.9–10.3)
Chloride: 89 mmol/L — ABNORMAL LOW (ref 98–111)
Creatinine, Ser: 6.4 mg/dL — ABNORMAL HIGH (ref 0.44–1.00)
GFR calc Af Amer: 7 mL/min — ABNORMAL LOW (ref >60)
GFR calc non Af Amer: 6 mL/min — ABNORMAL LOW (ref >60)
Glucose, Bld: 82 mg/dL (ref 70–99)
Potassium: 4.1 mmol/L (ref 3.5–5.1)
Sodium: 133 mmol/L — ABNORMAL LOW (ref 135–145)
Total Bilirubin: 0.4 mg/dL (ref 0.3–1.2)
Total Protein: 7.7 g/dL (ref 6.5–8.1)

## 2020-04-04 LAB — CBC
HCT: 29 % — ABNORMAL LOW (ref 36.0–46.0)
Hemoglobin: 8.3 g/dL — ABNORMAL LOW (ref 12.0–15.0)
MCH: 27.7 pg (ref 26.0–34.0)
MCHC: 28.6 g/dL — ABNORMAL LOW (ref 30.0–36.0)
MCV: 96.7 fL (ref 80.0–100.0)
Platelets: 234 10*3/uL (ref 150–400)
RBC: 3 MIL/uL — ABNORMAL LOW (ref 3.87–5.11)
RDW: 18.8 % — ABNORMAL HIGH (ref 11.5–15.5)
WBC: 11 10*3/uL — ABNORMAL HIGH (ref 4.0–10.5)
nRBC: 0 % (ref 0.0–0.2)

## 2020-04-04 LAB — LACTIC ACID, PLASMA: Lactic Acid, Venous: 2 mmol/L (ref 0.5–1.9)

## 2020-04-04 LAB — SARS CORONAVIRUS 2 BY RT PCR (HOSPITAL ORDER, PERFORMED IN ~~LOC~~ HOSPITAL LAB): SARS Coronavirus 2: NEGATIVE

## 2020-04-04 LAB — LIPASE, BLOOD: Lipase: 25 U/L (ref 11–51)

## 2020-04-04 MED ORDER — FENTANYL CITRATE (PF) 100 MCG/2ML IJ SOLN
100.0000 ug | Freq: Once | INTRAMUSCULAR | Status: AC
  Administered 2020-04-04: 100 ug via INTRAVENOUS
  Filled 2020-04-04: qty 2

## 2020-04-04 MED ORDER — ONDANSETRON HCL 4 MG/2ML IJ SOLN
4.0000 mg | Freq: Once | INTRAMUSCULAR | Status: AC
  Administered 2020-04-04: 4 mg via INTRAVENOUS
  Filled 2020-04-04: qty 2

## 2020-04-04 MED ORDER — IOHEXOL 300 MG/ML  SOLN
100.0000 mL | Freq: Once | INTRAMUSCULAR | Status: AC | PRN
  Administered 2020-04-04: 100 mL via INTRAVENOUS

## 2020-04-04 NOTE — ED Triage Notes (Addendum)
Patient complaints of LLQ and RLQ pain. States pain worsening overtime. Pain was increased during dialysis today and only had half a session. Patient also stated has a wound with packing right side of abdomen.

## 2020-04-04 NOTE — Telephone Encounter (Signed)
Pt's care giver calling, pt present, they are in route to ED. States severe abdominal pain, both sides, onset 2 weeks ago, worsening. Denies N/V/D. Triage incomplete as pt is "Pulling up to hospital now."  Reason for Disposition . [1] SEVERE pain (e.g., excruciating) AND [2] present > 1 hour  Answer Assessment - Initial Assessment Questions 1. LOCATION: "Where does it hurt?"      Both sides 2. RADIATION: "Does the pain shoot anywhere else?" (e.g., chest, back)      3. ONSET: "When did the pain begin?" (e.g., minutes, hours or days ago)      2 weeks ago 4. SUDDEN: "Gradual or sudden onset?"     Sudden 2 weeks ago 5. PATTERN "Does the pain come and go, or is it constant?"    - If constant: "Is it getting better, staying the same, or worsening?"      (Note: Constant means the pain never goes away completely; most serious pain is constant and it progresses)     - If intermittent: "How long does it last?" "Do you have pain now?"     (Note: Intermittent means the pain goes away completely between bouts)      6. SEVERITY: "How bad is the pain?"  (e.g., Scale 1-10; mild, moderate, or severe)   - MILD (1-3): doesn't interfere with normal activities, abdomen soft and not tender to touch    - MODERATE (4-7): interferes with normal activities or awakens from sleep, tender to touch    - SEVERE (8-10): excruciating pain, doubled over, unable to do any normal activities       7. RECURRENT SYMPTOM: "Have you ever had this type of stomach pain before?" If Yes, ask: "When was the last time?" and "What happened that time?"      *No Answer* 8. CAUSE: "What do you think is causing the stomach pain?"     *No Answer* 9. RELIEVING/AGGRAVATING FACTORS: "What makes it better or worse?" (e.g., movement, antacids, bowel movement)     *No Answer* 10. OTHER SYMPTOMS: "Has there been any vomiting, diarrhea, constipation, or urine problems?"       no  Protocols used: ABDOMINAL PAIN - [facility]

## 2020-04-04 NOTE — ED Provider Notes (Signed)
Care handoff received from Carlisle Cater PA-C at shift change please see previous provider note for full details of visit.  Patient presents today with flank pain, dialysis patient labs overall reassuring.  CT abdomen pelvis pending at signout.  Patient takes methadone at her facility, pain controlled in the ER.  Plan of care is to follow-up on CT abdomen pelvis, pending no emergent findings discharge back to facility. Physical Exam  BP (!) 153/114 (BP Location: Right Arm)   Pulse 67   Temp 98.5 F (36.9 C) (Oral)   Resp 13   Wt (!) 165 kg   LMP  (LMP Unknown)   SpO2 100%   BMI 50.73 kg/m   Physical Exam Constitutional:      General: She is not in acute distress.    Appearance: Normal appearance. She is well-developed. She is obese. She is not ill-appearing or diaphoretic.  HENT:     Head: Normocephalic and atraumatic.  Eyes:     General: Vision grossly intact. Gaze aligned appropriately.     Pupils: Pupils are equal, round, and reactive to light.  Neck:     Trachea: Trachea and phonation normal.  Pulmonary:     Effort: Pulmonary effort is normal. No respiratory distress.  Abdominal:     General: There is no distension.     Palpations: Abdomen is soft.     Tenderness: There is no abdominal tenderness. There is no guarding or rebound.  Musculoskeletal:        General: Normal range of motion.     Cervical back: Normal range of motion.  Skin:    General: Skin is warm and dry.          Comments: Mild cellulitis and some amount of purulent drainage.  Neurological:     Mental Status: She is alert.     GCS: GCS eye subscore is 4. GCS verbal subscore is 5. GCS motor subscore is 6.     Comments: Speech is clear and goal oriented, follows commands Major Cranial nerves without deficit, no facial droop Moves extremities without ataxia, coordination intact  Psychiatric:        Behavior: Behavior normal.     ED Course/Procedures     Procedures  MDM  CBC shows mild  leukocytosis of 11.0, hemoglobin 8.3 appears baseline. Lactic 2.0. Lipase within normal limits. CMP shows baseline elevated kidney function, potassium 4.1, no emergent Electra derangement, emergent LFT elevations, gap. Covid test negative  CXR:    IMPRESSION:  1. Question of vascular congestion and perhaps early mild edema.  Findings raise the question of heart failure or volume overload,  mild asymmetry could also be seen in the setting of viral or  atypical infection.  2. No consolidation or pleural effusion.   Patient without respiratory complaint, SPO2 remains 100% on room air.  Suspect x-ray findings secondary to only receiving half dialysis session.  No indication for emergent dialysis at this time.  CT abdomen pelvis:  IMPRESSION:  1. Minimal skin thickening involving the anterior upper right thigh,  may represent cellulitis. No focal fluid collection or abscess.  Portions of the right and left flank soft tissues are excluded from  the field of view given habitus.  2. No explanation for flank pain.  3. Mild urinary bladder wall thickening, likely chronic in the  setting of chronic renal disease.  4. Hepatomegaly and hepatic steatosis.  5. A 5 mm pleural based left lower lobe pulmonary nodule. In a low  risk patient, no  further follow-up is needed. In a high-risk  patient, recommend 12 month follow-up chest CT.  6. Advanced aortic and branch atherosclerosis. No acute vascular  findings.   Patient was reassessed she is resting comfortably in bed no acute distress I evaluated patient's right upper thigh she has a small draining wound to the area with purulent drainage and minimal surrounding cellulitis.  Do not think this is contributing to the flank pain which she presented to the ER for today.  Patient reports that the nursing home has been bandaging this wound but has not placed on any antibiotics.  Will start patient on doxycycline and have her follow-up with her primary care  provider for recheck.  Patient's blood pressure noted to be low after she received fentanyl, due to body habitus she has a large cuff on her right forearm, unable to use other extremities for blood pressure.  Will monitor she remains fully alert and oriented other vital signs stable. - Low blood pressures documented earlier, patient's blood pressure cuff line was kinked, was restrained out blood pressure improved without hypotension.  Patient reassessed multiple times sleeping easily arousable to voice states understanding care plan.  At this time there does not appear to be any evidence of an acute emergency medical condition and the patient appears stable for discharge with appropriate outpatient follow up. Diagnosis was discussed with patient who verbalizes understanding of care plan and is agreeable to discharge. I have discussed return precautions with patient who verbalizes understanding. Patient encouraged to follow-up with their PCP. All questions answered.  Patient seen and evaluated by Dr. Leonette Monarch during this visit.  Note: Portions of this report may have been transcribed using voice recognition software. Every effort was made to ensure accuracy; however, inadvertent computerized transcription errors may still be present.      Gari Crown 04/05/20 0427    Fatima Blank, MD 04/05/20 [phone]

## 2020-04-04 NOTE — ED Notes (Signed)
Pt placed on 2L after receiving fentanyl, 85%RA, 96%2L, states she sometimes requires O2 at HD

## 2020-04-04 NOTE — ED Notes (Signed)
Received bedside report from [place]. Pt just returned from CT and pain meds ordered and will be administered.
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
## 2020-04-05 DIAGNOSIS — R109 Unspecified abdominal pain: Secondary | ICD-10-CM | POA: Diagnosis not present

## 2020-04-05 MED ORDER — DOXYCYCLINE HYCLATE 100 MG PO TABS
100.0000 mg | ORAL_TABLET | Freq: Once | ORAL | Status: AC
  Administered 2020-04-05: 100 mg via ORAL
  Filled 2020-04-05: qty 1

## 2020-04-05 MED ORDER — DOXYCYCLINE HYCLATE 100 MG PO CAPS: 100.0000 mg | ORAL_CAPSULE | Freq: Two times a day (BID) | ORAL | 0 refills | Status: AC

## 2020-04-05 NOTE — ED Notes (Signed)
Pt caregiver called to pick patient up. Will be here in 15 minutes.

## 2020-04-05 NOTE — ED Notes (Signed)
Kela RN called to get status up date on pt. Update given.

## 2020-04-05 NOTE — ED Notes (Signed)
Patient transported to CT 

## 2020-04-05 NOTE — Discharge Instructions (Addendum)
At this time there does not appear to be the presence of an emergent medical condition, however there is always the potential for conditions to change. Please read and follow the below instructions.  Please return to the Emergency Department immediately for any new or worsening symptom. Please be sure to follow up with your Primary Care Provider within one week regarding your visit today; please call their office to schedule an appointment even if you are feeling better for a follow-up visit. Please take your antibiotic Doxycycline as prescribed until complete to help with your symptoms.  Please drink enough water to avoid dehydration and get plenty of rest. Please have the wound of your right thigh rechecked by your doctor in 2-3 days to ensure healing. Your CT scan also showed some bladder wall thickening, hepatomegaly, hepatic steatosis, a 5 mm nodule on your left lung and atherosclerosis.  Please discuss all of these incidental findings with your primary care doctor at your follow-up visit.  Your lung nodule may need follow-up imaging based on your risk factors in 1 year, discuss this with your primary care doctor at your follow-up visit. Be sure to go to your dialysis session as scheduled.  Get help right away if: You have trouble breathing. You are short of breath. Your belly hurts, or it is swollen or red. You feel sick to your stomach (nauseous). You throw up (vomit). You feel like you will pass out, or you do pass out (faint). You have blood in your pee. You have any new/concerning or worsening of symptoms  Please read the additional information packets attached to your discharge summary.  Do not take your medicine if  develop an itchy rash, swelling in your mouth or lips, or difficulty breathing; call 911 and seek immediate emergency medical attention if this occurs.  You may review your lab tests and imaging results in their entirety on your MyChart account.  Please discuss all  results of fully with your primary care provider and other specialist at your follow-up visit.  Note: Portions of this text may have been transcribed using voice recognition software. Every effort was made to ensure accuracy; however, inadvertent computerized transcription errors may still be present.

## 2020-04-05 NOTE — ED Notes (Signed)
Called [facility] for transport home.  

## 2020-04-24 ENCOUNTER — Encounter: Payer: Self-pay | Admitting: Orthopaedic Surgery

## 2020-04-24 ENCOUNTER — Ambulatory Visit (INDEPENDENT_AMBULATORY_CARE_PROVIDER_SITE_OTHER): Payer: Medicare Other

## 2020-04-24 ENCOUNTER — Ambulatory Visit (INDEPENDENT_AMBULATORY_CARE_PROVIDER_SITE_OTHER): Payer: Medicare Other | Admitting: Orthopaedic Surgery

## 2020-04-24 VITALS — Ht 71.0 in | Wt 363.0 lb

## 2020-04-24 DIAGNOSIS — M1711 Unilateral primary osteoarthritis, right knee: Secondary | ICD-10-CM | POA: Diagnosis not present

## 2020-04-24 DIAGNOSIS — S82191G Other fracture of upper end of right tibia, subsequent encounter for closed fracture with delayed healing: Secondary | ICD-10-CM | POA: Diagnosis not present
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
## 2020-05-11 ENCOUNTER — Other Ambulatory Visit: Payer: Self-pay

## 2020-05-11 ENCOUNTER — Emergency Department (HOSPITAL_COMMUNITY): Payer: Medicare Other

## 2020-05-11 ENCOUNTER — Emergency Department (HOSPITAL_COMMUNITY)
Admission: EM | Admit: 2020-05-11 | Discharge: 2020-05-11 | Disposition: A | Discharge status: Home / Self Care | Payer: Medicare Other | Source: Home / Self Care | Attending: Emergency Medicine | Admitting: Emergency Medicine

## 2020-05-11 DIAGNOSIS — E1165 Type 2 diabetes mellitus with hyperglycemia: Secondary | ICD-10-CM | POA: Insufficient documentation

## 2020-05-11 DIAGNOSIS — N186 End stage renal disease: Secondary | ICD-10-CM | POA: Insufficient documentation

## 2020-05-11 DIAGNOSIS — S8011XA Contusion of right lower leg, initial encounter: Secondary | ICD-10-CM | POA: Insufficient documentation

## 2020-05-11 DIAGNOSIS — W228XXA Striking against or struck by other objects, initial encounter: Secondary | ICD-10-CM | POA: Insufficient documentation

## 2020-05-11 DIAGNOSIS — J45909 Unspecified asthma, uncomplicated: Secondary | ICD-10-CM | POA: Insufficient documentation

## 2020-05-11 DIAGNOSIS — Z794 Long term (current) use of insulin: Secondary | ICD-10-CM | POA: Insufficient documentation

## 2020-05-11 DIAGNOSIS — I120 Hypertensive chronic kidney disease with stage 5 chronic kidney disease or end stage renal disease: Secondary | ICD-10-CM | POA: Insufficient documentation

## 2020-05-11 MED ORDER — TRAMADOL HCL 50 MG PO TABS
100.0000 mg | ORAL_TABLET | Freq: Once | ORAL | Status: AC
  Administered 2020-05-11: 100 mg via ORAL
  Filled 2020-05-11: qty 2

## 2020-05-11 NOTE — ED Notes (Signed)
[facility] called 

## 2020-05-11 NOTE — Discharge Instructions (Signed)
Please apply ice to the swollen area, wrap the ice in a towel and replace it once an hour.  Continue to take your pain medication as prescribed by your doctor.  Your x-rays show that your bones are not healing very quickly, the fracture is still present and may take a very long time to heal.  You will need to follow-up with your doctor within 1 week, seek medical exam for severe or worsening symptoms
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
## 2020-05-13 ENCOUNTER — Other Ambulatory Visit: Payer: Self-pay

## 2020-05-13 ENCOUNTER — Encounter (HOSPITAL_COMMUNITY)
Admission: EM | Disposition: A | Discharge status: Hospice | Payer: Self-pay | Source: Home / Self Care | Attending: Internal Medicine

## 2020-05-13 ENCOUNTER — Encounter (HOSPITAL_COMMUNITY): Payer: Self-pay | Admitting: Emergency Medicine

## 2020-05-13 ENCOUNTER — Inpatient Hospital Stay (HOSPITAL_COMMUNITY)
Admission: EM | Admit: 2020-05-13 | Discharge: 2020-05-25 | DRG: 579 | Disposition: A | Discharge status: Hospice | Payer: Medicare Other | Attending: Internal Medicine | Admitting: Internal Medicine

## 2020-05-13 DIAGNOSIS — Z86711 Personal history of pulmonary embolism: Secondary | ICD-10-CM | POA: Diagnosis not present

## 2020-05-13 DIAGNOSIS — Z7189 Other specified counseling: Secondary | ICD-10-CM | POA: Diagnosis not present

## 2020-05-13 DIAGNOSIS — E1165 Type 2 diabetes mellitus with hyperglycemia: Secondary | ICD-10-CM | POA: Diagnosis present

## 2020-05-13 DIAGNOSIS — Z6841 Body Mass Index (BMI) 40.0 and over, adult: Secondary | ICD-10-CM | POA: Diagnosis not present

## 2020-05-13 DIAGNOSIS — Z86718 Personal history of other venous thrombosis and embolism: Secondary | ICD-10-CM

## 2020-05-13 DIAGNOSIS — S82842A Displaced bimalleolar fracture of left lower leg, initial encounter for closed fracture: Secondary | ICD-10-CM | POA: Diagnosis present

## 2020-05-13 DIAGNOSIS — Z888 Allergy status to other drugs, medicaments and biological substances status: Secondary | ICD-10-CM

## 2020-05-13 DIAGNOSIS — E871 Hypo-osmolality and hyponatremia: Secondary | ICD-10-CM | POA: Diagnosis not present

## 2020-05-13 DIAGNOSIS — J45909 Unspecified asthma, uncomplicated: Secondary | ICD-10-CM | POA: Diagnosis present

## 2020-05-13 DIAGNOSIS — S8012XA Contusion of left lower leg, initial encounter: Secondary | ICD-10-CM | POA: Diagnosis not present

## 2020-05-13 DIAGNOSIS — Z66 Do not resuscitate: Secondary | ICD-10-CM | POA: Diagnosis not present

## 2020-05-13 DIAGNOSIS — R262 Difficulty in walking, not elsewhere classified: Secondary | ICD-10-CM | POA: Diagnosis present

## 2020-05-13 DIAGNOSIS — M810 Age-related osteoporosis without current pathological fracture: Secondary | ICD-10-CM | POA: Diagnosis present

## 2020-05-13 DIAGNOSIS — Z7401 Bed confinement status: Secondary | ICD-10-CM

## 2020-05-13 DIAGNOSIS — W208XXA Other cause of strike by thrown, projected or falling object, initial encounter: Secondary | ICD-10-CM | POA: Diagnosis present

## 2020-05-13 DIAGNOSIS — Z886 Allergy status to analgesic agent status: Secondary | ICD-10-CM

## 2020-05-13 DIAGNOSIS — N186 End stage renal disease: Secondary | ICD-10-CM | POA: Diagnosis present

## 2020-05-13 DIAGNOSIS — Z885 Allergy status to narcotic agent status: Secondary | ICD-10-CM

## 2020-05-13 DIAGNOSIS — I120 Hypertensive chronic kidney disease with stage 5 chronic kidney disease or end stage renal disease: Secondary | ICD-10-CM | POA: Diagnosis present

## 2020-05-13 DIAGNOSIS — E785 Hyperlipidemia, unspecified: Secondary | ICD-10-CM | POA: Diagnosis not present

## 2020-05-13 DIAGNOSIS — S8011XA Contusion of right lower leg, initial encounter: Principal | ICD-10-CM | POA: Diagnosis present

## 2020-05-13 DIAGNOSIS — R52 Pain, unspecified: Secondary | ICD-10-CM

## 2020-05-13 DIAGNOSIS — L89222 Pressure ulcer of left hip, stage 2: Secondary | ICD-10-CM | POA: Diagnosis present

## 2020-05-13 DIAGNOSIS — S8011XD Contusion of right lower leg, subsequent encounter: Secondary | ICD-10-CM | POA: Diagnosis not present

## 2020-05-13 DIAGNOSIS — H548 Legal blindness, as defined in USA: Secondary | ICD-10-CM | POA: Diagnosis present

## 2020-05-13 DIAGNOSIS — L89524 Pressure ulcer of left ankle, stage 4: Secondary | ICD-10-CM | POA: Diagnosis present

## 2020-05-13 DIAGNOSIS — Z515 Encounter for palliative care: Secondary | ICD-10-CM

## 2020-05-13 DIAGNOSIS — E119 Type 2 diabetes mellitus without complications: Secondary | ICD-10-CM | POA: Diagnosis not present

## 2020-05-13 DIAGNOSIS — N2581 Secondary hyperparathyroidism of renal origin: Secondary | ICD-10-CM | POA: Diagnosis present

## 2020-05-13 DIAGNOSIS — E1169 Type 2 diabetes mellitus with other specified complication: Secondary | ICD-10-CM | POA: Diagnosis not present

## 2020-05-13 DIAGNOSIS — L89893 Pressure ulcer of other site, stage 3: Secondary | ICD-10-CM | POA: Diagnosis present

## 2020-05-13 DIAGNOSIS — Z992 Dependence on renal dialysis: Secondary | ICD-10-CM

## 2020-05-13 DIAGNOSIS — Z20822 Contact with and (suspected) exposure to covid-19: Secondary | ICD-10-CM | POA: Diagnosis present

## 2020-05-13 DIAGNOSIS — Z794 Long term (current) use of insulin: Secondary | ICD-10-CM

## 2020-05-13 DIAGNOSIS — L89213 Pressure ulcer of right hip, stage 3: Secondary | ICD-10-CM | POA: Diagnosis present

## 2020-05-13 DIAGNOSIS — T148XXA Other injury of unspecified body region, initial encounter: Secondary | ICD-10-CM | POA: Diagnosis not present

## 2020-05-13 DIAGNOSIS — Z7901 Long term (current) use of anticoagulants: Secondary | ICD-10-CM

## 2020-05-13 DIAGNOSIS — E1122 Type 2 diabetes mellitus with diabetic chronic kidney disease: Secondary | ICD-10-CM | POA: Diagnosis present

## 2020-05-13 DIAGNOSIS — Z993 Dependence on wheelchair: Secondary | ICD-10-CM

## 2020-05-13 DIAGNOSIS — E875 Hyperkalemia: Secondary | ICD-10-CM | POA: Diagnosis present

## 2020-05-13 DIAGNOSIS — Z90710 Acquired absence of both cervix and uterus: Secondary | ICD-10-CM

## 2020-05-13 DIAGNOSIS — Z880 Allergy status to penicillin: Secondary | ICD-10-CM

## 2020-05-13 DIAGNOSIS — E876 Hypokalemia: Secondary | ICD-10-CM | POA: Diagnosis not present

## 2020-05-13 DIAGNOSIS — E662 Morbid (severe) obesity with alveolar hypoventilation: Secondary | ICD-10-CM | POA: Diagnosis present

## 2020-05-13 DIAGNOSIS — L089 Local infection of the skin and subcutaneous tissue, unspecified: Secondary | ICD-10-CM

## 2020-05-13 DIAGNOSIS — D62 Acute posthemorrhagic anemia: Secondary | ICD-10-CM | POA: Diagnosis not present

## 2020-05-13 DIAGNOSIS — Z79899 Other long term (current) drug therapy: Secondary | ICD-10-CM

## 2020-05-13 DIAGNOSIS — D696 Thrombocytopenia, unspecified: Secondary | ICD-10-CM | POA: Diagnosis not present

## 2020-05-13 DIAGNOSIS — E8889 Other specified metabolic disorders: Secondary | ICD-10-CM | POA: Diagnosis present

## 2020-05-13 DIAGNOSIS — L8990 Pressure ulcer of unspecified site, unspecified stage: Secondary | ICD-10-CM | POA: Insufficient documentation

## 2020-05-13 DIAGNOSIS — M869 Osteomyelitis, unspecified: Secondary | ICD-10-CM

## 2020-05-13 DIAGNOSIS — S40022A Contusion of left upper arm, initial encounter: Secondary | ICD-10-CM | POA: Diagnosis present

## 2020-05-13 DIAGNOSIS — I9589 Other hypotension: Secondary | ICD-10-CM | POA: Diagnosis not present

## 2020-05-13 DIAGNOSIS — Z91018 Allergy to other foods: Secondary | ICD-10-CM

## 2020-05-13 DIAGNOSIS — Z9119 Patient's noncompliance with other medical treatment and regimen: Secondary | ICD-10-CM

## 2020-05-13 HISTORY — PX: DEBRIDEMENT AND CLOSURE WOUND: SHX5614

## 2020-05-13 HISTORY — PX: HEMATOMA EVACUATION: SHX5118

## 2020-05-13 LAB — BASIC METABOLIC PANEL
Anion gap: 21 — ABNORMAL HIGH (ref 5–15)
BUN: 43 mg/dL — ABNORMAL HIGH (ref 8–23)
CO2: 23 mmol/L (ref 22–32)
Calcium: 9 mg/dL (ref 8.9–10.3)
Chloride: 88 mmol/L — ABNORMAL LOW (ref 98–111)
Creatinine, Ser: 7.27 mg/dL — ABNORMAL HIGH (ref 0.44–1.00)
GFR calc Af Amer: 6 mL/min — ABNORMAL LOW (ref >60)
GFR calc non Af Amer: 5 mL/min — ABNORMAL LOW (ref >60)
Glucose, Bld: 177 mg/dL — ABNORMAL HIGH (ref 70–99)
Potassium: 5.9 mmol/L — ABNORMAL HIGH (ref 3.5–5.1)
Sodium: 132 mmol/L — ABNORMAL LOW (ref 135–145)

## 2020-05-13 LAB — I-STAT CHEM 8, ED
BUN: 40 mg/dL — ABNORMAL HIGH (ref 8–23)
Calcium, Ion: 0.99 mmol/L — ABNORMAL LOW (ref 1.15–1.40)
Chloride: 93 mmol/L — ABNORMAL LOW (ref 98–111)
Creatinine, Ser: 7.1 mg/dL — ABNORMAL HIGH (ref 0.44–1.00)
Glucose, Bld: 154 mg/dL — ABNORMAL HIGH (ref 70–99)
HCT: 27 % — ABNORMAL LOW (ref 36.0–46.0)
Hemoglobin: 9.2 g/dL — ABNORMAL LOW (ref 12.0–15.0)
Potassium: 5.7 mmol/L — ABNORMAL HIGH (ref 3.5–5.1)
Sodium: 131 mmol/L — ABNORMAL LOW (ref 135–145)
TCO2: 25 mmol/L (ref 22–32)

## 2020-05-13 LAB — RESPIRATORY PANEL BY RT PCR (FLU A&B, COVID)
Influenza A by PCR: NEGATIVE
Influenza B by PCR: NEGATIVE
SARS Coronavirus 2 by RT PCR: NEGATIVE

## 2020-05-13 LAB — CBC
HCT: 22.7 % — ABNORMAL LOW (ref 36.0–46.0)
Hemoglobin: 6.6 g/dL — CL (ref 12.0–15.0)
MCH: 29.1 pg (ref 26.0–34.0)
MCHC: 29.1 g/dL — ABNORMAL LOW (ref 30.0–36.0)
MCV: 100 fL (ref 80.0–100.0)
Platelets: 180 10*3/uL (ref 150–400)
RBC: 2.27 MIL/uL — ABNORMAL LOW (ref 3.87–5.11)
RDW: 23.5 % — ABNORMAL HIGH (ref 11.5–15.5)
WBC: 17.4 10*3/uL — ABNORMAL HIGH (ref 4.0–10.5)
nRBC: 0.3 % — ABNORMAL HIGH (ref 0.0–0.2)

## 2020-05-13 LAB — ABO/RH: ABO/RH(D): O POS

## 2020-05-13 LAB — PROTIME-INR
INR: 1.7 — ABNORMAL HIGH (ref 0.8–1.2)
Prothrombin Time: 18.9 seconds — ABNORMAL HIGH (ref 11.4–15.2)

## 2020-05-13 LAB — PREPARE RBC (CROSSMATCH)

## 2020-05-13 SURGERY — EVACUATION HEMATOMA
Anesthesia: General | Site: Leg Upper | Laterality: Right

## 2020-05-13 MED ORDER — ONDANSETRON HCL 4 MG/2ML IJ SOLN: 4.0000 mg | Freq: Four times a day (QID) | INTRAMUSCULAR | Status: DC | PRN

## 2020-05-13 MED ORDER — SODIUM CHLORIDE 0.9 % IV SOLN
10.0000 mL/h | Freq: Once | INTRAVENOUS | Status: AC
  Administered 2020-05-14: 10 mL/h via INTRAVENOUS

## 2020-05-13 MED ORDER — FENTANYL CITRATE (PF) 250 MCG/5ML IJ SOLN
INTRAMUSCULAR | Status: AC
  Filled 2020-05-13: qty 5

## 2020-05-13 MED ORDER — CISATRACURIUM BESYLATE 20 MG/10ML IV SOLN
INTRAVENOUS | Status: AC
  Filled 2020-05-13: qty 10

## 2020-05-13 MED ORDER — HYDROMORPHONE HCL 1 MG/ML IJ SOLN
1.0000 mg | INTRAMUSCULAR | Status: DC | PRN
  Administered 2020-05-13 – 2020-05-16 (×6): 1 mg via INTRAVENOUS
  Filled 2020-05-13 (×7): qty 1

## 2020-05-13 MED ORDER — INSULIN ASPART 100 UNIT/ML ~~LOC~~ SOLN: 0.0000 [IU] | SUBCUTANEOUS | Status: DC

## 2020-05-13 MED ORDER — LINEZOLID 600 MG PO TABS
600.0000 mg | ORAL_TABLET | Freq: Two times a day (BID) | ORAL | Status: DC
  Administered 2020-05-14: 600 mg via ORAL
  Filled 2020-05-13: qty 1

## 2020-05-13 MED ORDER — PROPOFOL 10 MG/ML IV BOLUS
INTRAVENOUS | Status: AC
  Filled 2020-05-13: qty 20

## 2020-05-13 MED ORDER — HYDROXYZINE HCL 25 MG PO TABS
25.0000 mg | ORAL_TABLET | Freq: Once | ORAL | Status: AC
  Administered 2020-05-13: 25 mg via ORAL
  Filled 2020-05-13: qty 1

## 2020-05-13 MED ORDER — MIDODRINE HCL 5 MG PO TABS
10.0000 mg | ORAL_TABLET | ORAL | Status: DC
  Administered 2020-05-14 – 2020-05-16 (×3): 10 mg via ORAL
  Filled 2020-05-13 (×3): qty 2

## 2020-05-13 MED ORDER — INSULIN ASPART 100 UNIT/ML ~~LOC~~ SOLN
SUBCUTANEOUS | Status: AC
  Filled 2020-05-13: qty 1

## 2020-05-13 MED ORDER — CLINDAMYCIN PHOSPHATE 900 MG/50ML IV SOLN
900.0000 mg | INTRAVENOUS | Status: AC
  Administered 2020-05-14: 900 mg via INTRAVENOUS
  Filled 2020-05-13 (×3): qty 50

## 2020-05-13 MED ORDER — DOXYCYCLINE HYCLATE 100 MG PO TABS
100.0000 mg | ORAL_TABLET | Freq: Two times a day (BID) | ORAL | Status: DC
  Administered 2020-05-14: 100 mg via ORAL
  Filled 2020-05-13: qty 1

## 2020-05-13 MED ORDER — FENTANYL CITRATE (PF) 100 MCG/2ML IJ SOLN
25.0000 ug | Freq: Once | INTRAMUSCULAR | Status: AC
  Administered 2020-05-13: 25 ug via INTRAVENOUS
  Filled 2020-05-13 (×2): qty 2

## 2020-05-13 MED ORDER — DEXTROSE 50 % IV SOLN: 50.0000 mL | Freq: Once | INTRAVENOUS | Status: DC

## 2020-05-13 MED ORDER — INSULIN ASPART 100 UNIT/ML IV SOLN: 10.0000 [IU] | Freq: Once | INTRAVENOUS | Status: DC

## 2020-05-13 MED ORDER — HYDROXYZINE HCL 25 MG PO TABS
25.0000 mg | ORAL_TABLET | Freq: Three times a day (TID) | ORAL | Status: DC | PRN
  Administered 2020-05-13 – 2020-05-15 (×2): 25 mg via ORAL
  Filled 2020-05-13 (×3): qty 1

## 2020-05-13 MED ORDER — FENTANYL CITRATE (PF) 100 MCG/2ML IJ SOLN
50.0000 ug | Freq: Once | INTRAMUSCULAR | Status: AC
  Administered 2020-05-13: 50 ug via INTRAVENOUS
  Filled 2020-05-13: qty 2

## 2020-05-13 MED ORDER — ONDANSETRON HCL 4 MG PO TABS: 4.0000 mg | ORAL_TABLET | Freq: Four times a day (QID) | ORAL | Status: DC | PRN

## 2020-05-13 MED ORDER — ACETAMINOPHEN 650 MG RE SUPP: 650.0000 mg | Freq: Four times a day (QID) | RECTAL | Status: DC | PRN

## 2020-05-13 MED ORDER — INSULIN ASPART 100 UNIT/ML ~~LOC~~ SOLN
0.0000 [IU] | SUBCUTANEOUS | Status: DC
  Administered 2020-05-14: 3 [IU] via SUBCUTANEOUS
  Administered 2020-05-14 – 2020-05-15 (×4): 1 [IU] via SUBCUTANEOUS
  Administered 2020-05-15: 2 [IU] via SUBCUTANEOUS
  Administered 2020-05-15: 3 [IU] via SUBCUTANEOUS
  Administered 2020-05-15: 2 [IU] via SUBCUTANEOUS
  Administered 2020-05-15: 1 [IU] via SUBCUTANEOUS
  Administered 2020-05-15: 2 [IU] via SUBCUTANEOUS
  Administered 2020-05-16 (×2): 1 [IU] via SUBCUTANEOUS
  Administered 2020-05-16: 3 [IU] via SUBCUTANEOUS
  Administered 2020-05-16: 1 [IU] via SUBCUTANEOUS
  Administered 2020-05-16 – 2020-05-17 (×6): 2 [IU] via SUBCUTANEOUS
  Administered 2020-05-18 (×3): 1 [IU] via SUBCUTANEOUS
  Administered 2020-05-18: 2 [IU] via SUBCUTANEOUS
  Administered 2020-05-18 – 2020-05-20 (×7): 1 [IU] via SUBCUTANEOUS
  Administered 2020-05-20: 2 [IU] via SUBCUTANEOUS
  Administered 2020-05-20: 3 [IU] via SUBCUTANEOUS
  Administered 2020-05-20 – 2020-05-21 (×2): 1 [IU] via SUBCUTANEOUS
  Administered 2020-05-21: 2 [IU] via SUBCUTANEOUS
  Administered 2020-05-21 – 2020-05-22 (×4): 1 [IU] via SUBCUTANEOUS

## 2020-05-13 MED ORDER — PRAVASTATIN SODIUM 40 MG PO TABS
40.0000 mg | ORAL_TABLET | Freq: Every day | ORAL | Status: DC
  Administered 2020-05-14 – 2020-05-23 (×10): 40 mg via ORAL
  Filled 2020-05-13 (×10): qty 1

## 2020-05-13 MED ORDER — ALBUTEROL SULFATE HFA 108 (90 BASE) MCG/ACT IN AERS
2.0000 | INHALATION_SPRAY | RESPIRATORY_TRACT | Status: DC | PRN
  Filled 2020-05-13: qty 6.7

## 2020-05-13 MED ORDER — SODIUM ZIRCONIUM CYCLOSILICATE 10 G PO PACK
10.0000 g | PACK | Freq: Every day | ORAL | Status: DC
  Administered 2020-05-13 – 2020-05-17 (×5): 10 g via ORAL
  Filled 2020-05-13 (×5): qty 1

## 2020-05-13 MED ORDER — ACETAMINOPHEN 325 MG PO TABS
650.0000 mg | ORAL_TABLET | Freq: Four times a day (QID) | ORAL | Status: DC | PRN
  Administered 2020-05-17 – 2020-05-23 (×7): 650 mg via ORAL
  Filled 2020-05-13 (×6): qty 2

## 2020-05-13 MED ORDER — MONTELUKAST SODIUM 10 MG PO TABS
10.0000 mg | ORAL_TABLET | Freq: Every day | ORAL | Status: DC
  Administered 2020-05-14 – 2020-05-23 (×10): 10 mg via ORAL
  Filled 2020-05-13 (×10): qty 1

## 2020-05-13 SURGICAL SUPPLY — 36 items
BLADE CLIPPER SURG (BLADE) IMPLANT
BNDG COHESIVE 6X5 TAN NS LF (GAUZE/BANDAGES/DRESSINGS) ×3 IMPLANT
BNDG GAUZE ELAST 4 BULKY (GAUZE/BANDAGES/DRESSINGS) ×6 IMPLANT
CANISTER SUCT 3000ML PPV (MISCELLANEOUS) ×3 IMPLANT
COVER SURGICAL LIGHT HANDLE (MISCELLANEOUS) ×3 IMPLANT
COVER WAND RF STERILE (DRAPES) ×3 IMPLANT
DRAPE IMP U-DRAPE 54X76 (DRAPES) ×6 IMPLANT
DRAPE INCISE IOBAN 66X45 STRL (DRAPES) ×3 IMPLANT
DRAPE LAPAROSCOPIC ABDOMINAL (DRAPES) IMPLANT
DRAPE LAPAROTOMY 100X72 PEDS (DRAPES) IMPLANT
DRAPE ORTHO SPLIT 77X108 STRL (DRAPES) ×2
DRAPE SURG ORHT 6 SPLT 77X108 (DRAPES) ×4 IMPLANT
DRSG PAD ABDOMINAL 8X10 ST (GAUZE/BANDAGES/DRESSINGS) ×3 IMPLANT
ELECT REM PT RETURN 9FT ADLT (ELECTROSURGICAL) ×3
ELECTRODE REM PT RTRN 9FT ADLT (ELECTROSURGICAL) ×2 IMPLANT
GAUZE SPONGE 4X4 12PLY STRL (GAUZE/BANDAGES/DRESSINGS) ×6 IMPLANT
GLOVE BIOGEL PI IND STRL 7.0 (GLOVE) ×4 IMPLANT
GLOVE BIOGEL PI INDICATOR 7.0 (GLOVE) ×2
GLOVE SURG SS PI 7.0 STRL IVOR (GLOVE) ×3 IMPLANT
GOWN STRL REUS W/ TWL LRG LVL3 (GOWN DISPOSABLE) ×4 IMPLANT
GOWN STRL REUS W/TWL LRG LVL3 (GOWN DISPOSABLE) ×2
HEMOSTAT SNOW SURGICEL 2X4 (HEMOSTASIS) ×3 IMPLANT
HOVERMATT SINGLE USE (MISCELLANEOUS) ×3 IMPLANT
KIT BASIN OR (CUSTOM PROCEDURE TRAY) ×3 IMPLANT
KIT TURNOVER KIT B (KITS) ×3 IMPLANT
NEEDLE 22X1 1/2 (OR ONLY) (NEEDLE) ×3 IMPLANT
NS IRRIG 1000ML POUR BTL (IV SOLUTION) ×3 IMPLANT
PACK GENERAL/GYN (CUSTOM PROCEDURE TRAY) ×3 IMPLANT
PAD ARMBOARD 7.5X6 YLW CONV (MISCELLANEOUS) ×6 IMPLANT
PENCIL SMOKE EVACUATOR (MISCELLANEOUS) ×3 IMPLANT
STOCKINETTE IMPERVIOUS LG (DRAPES) ×3 IMPLANT
SWAB COLLECTION DEVICE MRSA (MISCELLANEOUS) IMPLANT
SWAB CULTURE ESWAB REG 1ML (MISCELLANEOUS) IMPLANT
SYR BULB EAR ULCER 3OZ GRN STR (SYRINGE) ×3 IMPLANT
TOWEL GREEN STERILE (TOWEL DISPOSABLE) ×3 IMPLANT
TOWEL GREEN STERILE FF (TOWEL DISPOSABLE) ×3 IMPLANT
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
## 2020-05-13 NOTE — ED Notes (Signed)
This RN notified MD of pt black tarry stool and ask for PRN pain medication
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
## 2020-05-13 NOTE — Anesthesia Preprocedure Evaluation (Addendum)
Anesthesia Evaluation  Patient identified by MRN, date of birth, ID band Patient awake    Reviewed: Allergy & Precautions, NPO status , Patient's Chart, lab work & pertinent test results  Airway Mallampati: III  TM Distance: >3 FB Neck ROM: Full    Dental  (+) Chipped,    Pulmonary asthma , PE   Pulmonary exam normal breath sounds clear to auscultation       Cardiovascular hypertension, Pt. on medications Normal cardiovascular exam Rhythm:Regular Rate:Normal  ECG: ST, rate 102. RBBB and LPFB   Neuro/Psych Legally blind    GI/Hepatic negative GI ROS, Neg liver ROS,   Endo/Other  diabetes, Insulin DependentMorbid obesity  Renal/GU ESRF and DialysisRenal disease     Musculoskeletal negative musculoskeletal ROS (+)   Abdominal (+) + obese,   Peds  Hematology  (+) anemia ,   Anesthesia Other Findings Right Leg Hematoma  Reproductive/Obstetrics                            Anesthesia Physical Anesthesia Plan  ASA: IV and emergent  Anesthesia Plan: General   Post-op Pain Management:    Induction: Intravenous  PONV Risk Score and Plan: 3 and Ondansetron, Dexamethasone and Treatment may vary due to age or medical condition  Airway Management Planned: Oral ETT  Additional Equipment:   Intra-op Plan:   Post-operative Plan: Extubation in OR  Informed Consent: I have reviewed the patients History and Physical, chart, labs and discussed the procedure including the risks, benefits and alternatives for the proposed anesthesia with the patient or authorized representative who has indicated his/her understanding and acceptance.     Dental advisory given  Plan Discussed with: CRNA  Anesthesia Plan Comments:        Anesthesia Quick Evaluation

## 2020-05-13 NOTE — ED Notes (Addendum)
IV team had difficulty gaining access and lab draw, IV team will contact someone else to perform a [place] line   1704 IV team at bedside again to attempt IV and labs

## 2020-05-13 NOTE — ED Notes (Signed)
Date and time results received: 05/13/20 1800 (use smartphrase ".now" to insert current time)  Test: hgb  Critical Value: 6.6  Name of Provider Notified: MD Trifan   Orders Received? Or Actions Taken?:

## 2020-05-13 NOTE — ED Provider Notes (Signed)
.  Critical Care Performed by: Wyvonnia Dusky, MD Authorized by: Wyvonnia Dusky, MD   Critical care provider statement:    Critical care time (minutes):  45   Critical care was necessary to treat or prevent imminent or life-threatening deterioration of the following conditions:  CNS failure or compromise   Critical care was time spent personally by me on the following activities:  Discussions with consultants, evaluation of patient's response to treatment, examination of patient, ordering and performing treatments and interventions, ordering and review of laboratory studies, ordering and review of radiographic studies, pulse oximetry, re-evaluation of patient's condition, obtaining history from patient or surrogate and review of old charts Comments:     Symptomatic blood loss anemia requiring transfusion      Wyvonnia Dusky, MD 05/13/20 1940
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
## 2020-05-14 ENCOUNTER — Encounter (HOSPITAL_COMMUNITY): Payer: Self-pay | Admitting: General Surgery

## 2020-05-14 ENCOUNTER — Inpatient Hospital Stay (HOSPITAL_COMMUNITY): Payer: Medicare Other | Admitting: Registered Nurse

## 2020-05-14 ENCOUNTER — Inpatient Hospital Stay (HOSPITAL_COMMUNITY): Payer: Medicare Other

## 2020-05-14 DIAGNOSIS — L8990 Pressure ulcer of unspecified site, unspecified stage: Secondary | ICD-10-CM | POA: Insufficient documentation

## 2020-05-14 DIAGNOSIS — E875 Hyperkalemia: Secondary | ICD-10-CM

## 2020-05-14 DIAGNOSIS — N186 End stage renal disease: Secondary | ICD-10-CM

## 2020-05-14 DIAGNOSIS — D62 Acute posthemorrhagic anemia: Secondary | ICD-10-CM | POA: Diagnosis not present

## 2020-05-14 DIAGNOSIS — Z86711 Personal history of pulmonary embolism: Secondary | ICD-10-CM

## 2020-05-14 DIAGNOSIS — L89893 Pressure ulcer of other site, stage 3: Secondary | ICD-10-CM | POA: Diagnosis not present

## 2020-05-14 DIAGNOSIS — S8011XA Contusion of right lower leg, initial encounter: Secondary | ICD-10-CM | POA: Diagnosis not present

## 2020-05-14 DIAGNOSIS — L89524 Pressure ulcer of left ankle, stage 4: Secondary | ICD-10-CM | POA: Diagnosis not present

## 2020-05-14 DIAGNOSIS — Z794 Long term (current) use of insulin: Secondary | ICD-10-CM

## 2020-05-14 DIAGNOSIS — S8012XA Contusion of left lower leg, initial encounter: Secondary | ICD-10-CM

## 2020-05-14 DIAGNOSIS — E119 Type 2 diabetes mellitus without complications: Secondary | ICD-10-CM

## 2020-05-14 LAB — BASIC METABOLIC PANEL
Anion gap: 23 — ABNORMAL HIGH (ref 5–15)
BUN: 52 mg/dL — ABNORMAL HIGH (ref 8–23)
CO2: 20 mmol/L — ABNORMAL LOW (ref 22–32)
Calcium: 9.1 mg/dL (ref 8.9–10.3)
Chloride: 88 mmol/L — ABNORMAL LOW (ref 98–111)
Creatinine, Ser: 7.41 mg/dL — ABNORMAL HIGH (ref 0.44–1.00)
GFR calc Af Amer: 6 mL/min — ABNORMAL LOW (ref >60)
GFR calc non Af Amer: 5 mL/min — ABNORMAL LOW (ref >60)
Glucose, Bld: 229 mg/dL — ABNORMAL HIGH (ref 70–99)
Potassium: 6.3 mmol/L (ref 3.5–5.1)
Sodium: 131 mmol/L — ABNORMAL LOW (ref 135–145)

## 2020-05-14 LAB — GLUCOSE, CAPILLARY
Glucose-Capillary: 183 mg/dL — ABNORMAL HIGH (ref 70–99)
Glucose-Capillary: 180 mg/dL — ABNORMAL HIGH (ref 70–99)
Glucose-Capillary: 209 mg/dL — ABNORMAL HIGH (ref 70–99)
Glucose-Capillary: 200 mg/dL — ABNORMAL HIGH (ref 70–99)
Glucose-Capillary: 273 mg/dL — ABNORMAL HIGH (ref 70–99)
Glucose-Capillary: 273 mg/dL — ABNORMAL HIGH (ref 70–99)
Glucose-Capillary: 186 mg/dL — ABNORMAL HIGH (ref 70–99)

## 2020-05-14 LAB — CBC
HCT: 23.6 % — ABNORMAL LOW (ref 36.0–46.0)
Hemoglobin: 7.1 g/dL — ABNORMAL LOW (ref 12.0–15.0)
MCH: 29.7 pg (ref 26.0–34.0)
MCHC: 30.1 g/dL (ref 30.0–36.0)
MCV: 98.7 fL (ref 80.0–100.0)
Platelets: 162 10*3/uL (ref 150–400)
RBC: 2.39 MIL/uL — ABNORMAL LOW (ref 3.87–5.11)
RDW: 22 % — ABNORMAL HIGH (ref 11.5–15.5)
WBC: 16.8 10*3/uL — ABNORMAL HIGH (ref 4.0–10.5)
nRBC: 0.2 % (ref 0.0–0.2)

## 2020-05-14 LAB — POCT I-STAT 7, (LYTES, BLD GAS, ICA,H+H)
Acid-base deficit: 1 mmol/L (ref 0.0–2.0)
Bicarbonate: 24.4 mmol/L (ref 20.0–28.0)
Calcium, Ion: 1.12 mmol/L — ABNORMAL LOW (ref 1.15–1.40)
HCT: 22 % — ABNORMAL LOW (ref 36.0–46.0)
Hemoglobin: 7.5 g/dL — ABNORMAL LOW (ref 12.0–15.0)
O2 Saturation: 100 %
Patient temperature: 36.5
Potassium: 5.8 mmol/L — ABNORMAL HIGH (ref 3.5–5.1)
Sodium: 130 mmol/L — ABNORMAL LOW (ref 135–145)
TCO2: 26 mmol/L (ref 22–32)
pCO2 arterial: 40.3 mmHg (ref 32.0–48.0)
pH, Arterial: 7.388 (ref 7.350–7.450)
pO2, Arterial: 267 mmHg — ABNORMAL HIGH (ref 83.0–108.0)

## 2020-05-14 LAB — HIV ANTIBODY (ROUTINE TESTING W REFLEX): HIV Screen 4th Generation wRfx: NONREACTIVE

## 2020-05-14 LAB — OCCULT BLOOD, POC DEVICE: Fecal Occult Bld: NEGATIVE

## 2020-05-14 MED ORDER — VASOPRESSIN 20 UNIT/ML IV SOLN
INTRAVENOUS | Status: AC
  Filled 2020-05-14: qty 1

## 2020-05-14 MED ORDER — DOXERCALCIFEROL 4 MCG/2ML IV SOLN
2.0000 ug | INTRAVENOUS | Status: DC
  Administered 2020-05-16 – 2020-05-23 (×2): 2 ug via INTRAVENOUS
  Filled 2020-05-14: qty 2

## 2020-05-14 MED ORDER — FENTANYL CITRATE (PF) 250 MCG/5ML IJ SOLN
INTRAMUSCULAR | Status: DC | PRN
Start: 2020-05-14 — End: 2020-05-14
  Administered 2020-05-14: 100 ug via INTRAVENOUS

## 2020-05-14 MED ORDER — DOXYCYCLINE HYCLATE 100 MG PO TABS
100.0000 mg | ORAL_TABLET | Freq: Two times a day (BID) | ORAL | Status: AC
  Administered 2020-05-14 – 2020-05-17 (×7): 100 mg via ORAL
  Filled 2020-05-14 (×7): qty 1

## 2020-05-14 MED ORDER — PHENYLEPHRINE HCL-NACL 10-0.9 MG/250ML-% IV SOLN
INTRAVENOUS | Status: DC | PRN
  Administered 2020-05-14: 25 ug/min via INTRAVENOUS

## 2020-05-14 MED ORDER — CALCIUM CHLORIDE 10 % IV SOLN
INTRAVENOUS | Status: DC | PRN
  Administered 2020-05-14 (×5): 200 mg via INTRAVENOUS

## 2020-05-14 MED ORDER — PHENYLEPHRINE 40 MCG/ML (10ML) SYRINGE FOR IV PUSH (FOR BLOOD PRESSURE SUPPORT)
PREFILLED_SYRINGE | INTRAVENOUS | Status: DC | PRN
  Administered 2020-05-14 (×5): 80 ug via INTRAVENOUS

## 2020-05-14 MED ORDER — ALBUMIN HUMAN 5 % IV SOLN: INTRAVENOUS | Status: DC | PRN

## 2020-05-14 MED ORDER — CALCIUM CHLORIDE 10 % IV SOLN
INTRAVENOUS | Status: AC
  Filled 2020-05-14: qty 10

## 2020-05-14 MED ORDER — NEOSTIGMINE METHYLSULFATE 10 MG/10ML IV SOLN
INTRAVENOUS | Status: DC | PRN
  Administered 2020-05-14: 5 mg via INTRAVENOUS

## 2020-05-14 MED ORDER — ONDANSETRON HCL 4 MG/2ML IJ SOLN: 4.0000 mg | Freq: Once | INTRAMUSCULAR | Status: DC | PRN

## 2020-05-14 MED ORDER — 0.9 % SODIUM CHLORIDE (POUR BTL) OPTIME
TOPICAL | Status: DC | PRN
  Administered 2020-05-14: 1000 mL

## 2020-05-14 MED ORDER — CISATRACURIUM BESYLATE (PF) 10 MG/5ML IV SOLN
INTRAVENOUS | Status: DC | PRN
  Administered 2020-05-14: 20 mg via INTRAVENOUS

## 2020-05-14 MED ORDER — LACTATED RINGERS IV BOLUS
500.0000 mL | Freq: Once | INTRAVENOUS | Status: AC
  Administered 2020-05-14: 500 mL via INTRAVENOUS

## 2020-05-14 MED ORDER — CHLORHEXIDINE GLUCONATE CLOTH 2 % EX PADS
6.0000 | MEDICATED_PAD | Freq: Every day | CUTANEOUS | Status: DC
  Administered 2020-05-15 – 2020-05-25 (×10): 6 via TOPICAL

## 2020-05-14 MED ORDER — LIDOCAINE 2% (20 MG/ML) 5 ML SYRINGE
INTRAMUSCULAR | Status: DC | PRN
  Administered 2020-05-14: 60 mg via INTRAVENOUS

## 2020-05-14 MED ORDER — PROPOFOL 10 MG/ML IV BOLUS
INTRAVENOUS | Status: DC | PRN
  Administered 2020-05-14: 100 mg via INTRAVENOUS

## 2020-05-14 MED ORDER — SODIUM CHLORIDE 0.9 % IV SOLN: INTRAVENOUS | Status: DC | PRN

## 2020-05-14 MED ORDER — COLLAGENASE 250 UNIT/GM EX OINT
TOPICAL_OINTMENT | Freq: Every day | CUTANEOUS | Status: DC
  Administered 2020-05-25: 1 via TOPICAL
  Filled 2020-05-14 (×2): qty 30

## 2020-05-14 MED ORDER — FENTANYL CITRATE (PF) 100 MCG/2ML IJ SOLN: 25.0000 ug | INTRAMUSCULAR | Status: DC | PRN

## 2020-05-14 MED ORDER — VASOPRESSIN 20 UNIT/ML IV SOLN
INTRAVENOUS | Status: DC | PRN
  Administered 2020-05-14 (×2): 1 [IU] via INTRAVENOUS

## 2020-05-14 MED ORDER — ACETAMINOPHEN 325 MG PO TABS
ORAL_TABLET | ORAL | Status: AC
  Administered 2020-05-14: 650 mg via ORAL
  Filled 2020-05-14: qty 2

## 2020-05-14 MED ORDER — ACETAMINOPHEN 10 MG/ML IV SOLN: 1000.0000 mg | Freq: Once | INTRAVENOUS | Status: DC | PRN

## 2020-05-14 MED ORDER — ONDANSETRON HCL 4 MG/2ML IJ SOLN
INTRAMUSCULAR | Status: DC | PRN
  Administered 2020-05-14: 4 mg via INTRAVENOUS

## 2020-05-14 MED ORDER — GLYCOPYRROLATE 0.2 MG/ML IJ SOLN
INTRAMUSCULAR | Status: DC | PRN
  Administered 2020-05-14: .8 mg via INTRAVENOUS

## 2020-05-14 NOTE — Progress Notes (Signed)
Inpatient Diabetes Program Recommendations  AACE/ADA: New Consensus Statement on Inpatient Glycemic Control (2015)  Target Ranges:  Prepandial:   less than 140 mg/dL      Peak postprandial:   less than 180 mg/dL (1-2 hours)      Critically ill patients:  140 - 180 mg/dL   Lab Results  Component Value Date   GLUCAP 200 (H) 05/14/2020   HGBA1C 7.1 (H) 03/01/2020    Review of Glycemic Control Results for Suzanne Levy, Suzanne Levy (MRN [id number]) as of 05/14/2020 10:14  Ref. Range 05/14/2020 01:28 05/14/2020 04:16 05/14/2020 07:19 05/14/2020 09:24  Glucose-Capillary Latest Ref Range: 70 - 99 mg/dL 183 (H) 180 (H) 209 (H) 200 (H)   Diabetes history: DM 2 Outpatient Diabetes medications:  70/30 - 15 units tid with meals NPH 36 units q AM and 20 units q PM Current orders for Inpatient glycemic control:  Novolog 0-6 units q 4 hours Inpatient Diabetes Program Recommendations:   Please consider adding Levemir 10 units bid.   Thanks,  Adah Perl, RN, BC-ADM Inpatient Diabetes Coordinator Pager [phone] (8a-5p)

## 2020-05-14 NOTE — Progress Notes (Addendum)
   05/14/20 0311  Assess: MEWS Score  Temp 98.1 F (36.7 C)  BP (!) 99/46  Pulse Rate (!) 101  ECG Heart Rate (!) 102  Resp 12  Level of Consciousness Alert  SpO2 99 %  O2 Device Nasal Cannula  O2 Flow Rate (L/min) 2 L/min  Assess: MEWS Score  MEWS Temp 0  MEWS Systolic 1  MEWS Pulse 1  MEWS RR 1  MEWS LOC 0  MEWS Score 3  MEWS Score Color Yellow  Assess: if the MEWS score is Yellow or Red  Were vital signs taken at a resting state? Yes  Focused Assessment No change from prior assessment  Early Detection of Sepsis Score *See Row Information* Medium  MEWS guidelines implemented *See Row Information* Yes  Treat  MEWS Interventions Escalated (See documentation below)  Pain Scale 0-10  Pain Score 6  Pain Type Surgical pain  Pain Location Leg  Pain Orientation Right  Pain Descriptors / Indicators Aching;Discomfort  Pain Frequency Constant  Pain Onset On-going  Patients Stated Pain Goal 0  Pain Intervention(s) Repositioned  Take Vital Signs  Increase Vital Sign Frequency  Yellow: Q 2hr X 2 then Q 4hr X 2, if remains yellow, continue Q 4hrs  Escalate  MEWS: Escalate Yellow: discuss with charge nurse/RN and consider discussing with provider and RRT  Notify: Charge Nurse/RN  Name of Charge Nurse/RN Optometrist, RN  Date Charge Nurse/RN Notified 05/14/20  Time Charge Nurse/RN Notified 0315  Notify: Provider  Provider Name/Title Dr. Tonie Griffith  Date Provider Notified 05/14/20  Time Provider Notified [phone]  Notification Type Page  Notification Reason Change in status (Low BP)  Response See new orders  Date of Provider Response 05/14/20  Time of Provider Response 0319   Patient arrived back to unit from PACU. Low diastolic pressures and low MAPs. [facility] hospitalist paged. 500cc LR bolus ordered. Will continue to monitor.

## 2020-05-14 NOTE — Transfer of Care (Signed)
Immediate Anesthesia Transfer of Care Note  Patient: Suzanne Levy  Procedure(s) Performed: EVACUATION HEMATOMA (Right Leg Upper) DEBRIDEMENT AND CLOSURE WOUND (Right )  Patient Location: PACU  Anesthesia Type:General  Level of Consciousness: awake and alert   Airway & Oxygen Therapy: Patient Spontanous Breathing and Patient connected to face mask oxygen  Post-op Assessment: Report given to RN and Post -op Vital signs reviewed and stable  Post vital signs: Reviewed and stable  Last Vitals:  Vitals Value Taken Time  BP 77/63 05/14/20 0130  Temp    Pulse 95 05/14/20 0132  Resp 21 05/14/20 0132  SpO2 100 % 05/14/20 0132  Vitals shown include unvalidated device data.  Last Pain:  Vitals:   05/13/20 2308  TempSrc: Oral  PainSc: 8       Patients Stated Pain Goal: 0 (32/95/18 8416)  Complications: No complications documented.

## 2020-05-14 NOTE — Progress Notes (Addendum)
Upon patient arrival to unit from PACU, attempted to roll patient to assess bottom and back side area. Patient was not able to be rolled to her side completely due to pain, area not thoroughly assessed. Patient with multiple pressure sores with odor throughout abdominal area and right hip area and left ankle pannus wound. WOC consult ordered. Will continue to monitor.
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
## 2020-05-14 NOTE — Progress Notes (Addendum)
PROGRESS NOTE    Khrystina Bonnes  TDD:[id number] DOB: 04/07/1951 DOA: 05/13/2020 PCP: [name], MD  Outpatient Specialists:   Brief Narrative:  Patient is a 69 year old female, morbidly obese, with past medical history significant for ESRD on MWF HD, history of PE on Eliquis, insulin-dependent type 2 diabetes, hypertension, hyperlipidemia and asthma.  Patient is wheelchair-bound.  Patient was admitted with ruptured hematoma to the right lower leg.  Patient has undergone evacuation of right leg hematoma, cauterization of bleeding areas and closure of 20 cm wound.  Nephrology team was consulted to manage patient hemodialysis.  Patient is currently undergoing hemodialysis.  Wound care team was also consulted to assist with patient's management.  Left outer pannus was also reported, Surgical to review and advise further management.  Assessment & Plan:   Principal Problem:   Traumatic hematoma of right lower leg Active Problems:   Hyperkalemia   ESRD (end stage renal disease) ([place])   History of pulmonary embolus (PE)   Acute blood loss anemia   Insulin dependent type 2 diabetes mellitus ([place])   Hyperlipidemia associated with type 2 diabetes mellitus ([facility])   Pressure injury of skin  Ruptured hematoma of right lower extremity: -This occurred in setting of anticoagulation use.   -General surgery has evacuated the hematoma, cauterized the bleeding vessel and closed the large wound. -Continue to hold Eliquis for now.  Acute blood loss anemia: Hemoglobin 6.6 compared to recent baseline of 8.1 secondary to blood loss from ruptured hematoma as above. -Transfused 2 unit PRBC -Hold Eliquis -Continue to monitor H/H.    Hyperkalemia: K 5.9 on admission without EKG changes.  Given Lokelma and 10 units NovoLog with D50. -We will likely resolve with hemodialysis.  History of PE/DVT on Eliquis: Holding Eliquis due to active bleeding.  ESRD on MWF HD: -Nephrology input is appreciated.    -For hemodialysis today.     Insulin-dependent type 2 diabetes: Scale insulin coverage.    Hypotension: Continue midodrine.  Hyperlipidemia: Resume pravastatin.  Asthma: Continue Singulair and as needed albuterol.  Left ankle and pannus wounds: Recent admit at [facility] 9/1-9/11 undergoing debridement of right flank and groin wounds on 9/4.  On oral antibiotics as an outpatient. -Wound care team input is appreciated.   -Surgical team will review the pannus.   -X-ray of the left ankle to rule out osteomyelitis.   -Further management will depend on hospital course.    Morbid obesity/OSA/OHS: -Resume CPAP at nighttime. -Ensure patient continue CPAP on discharge.  Long-term prognosis is guarded.  DVT prophylaxis: SCD Code Status: Full code Family Communication:  Disposition Plan: This will depend on hospital course   Consultants:   Surgery.  Nephrology.  Wound care.  Procedures:   Evacuation of hematoma from right leg, cauterization of bleeding areas, closure of 20 cm of wounds  Antimicrobials:   Doxycycline (low threshold to consult ID team)  Subjective: No fever or chills. No shortness of breath. No chest pain. Patient has not been compliant with CPAP at home.  Objective: Vitals:   05/14/20 0515 05/14/20 0700 05/14/20 1140 05/14/20 1454  BP: (!) 92/57 (!) 130/17 (!) 88/71   Pulse: 94 97 94 95  Resp: 15 12 13 14   Temp: 97.6 F (36.4 C) 97.6 F (36.4 C) 97.6 F (36.4 C)   TempSrc: Oral Oral Oral Oral  SpO2: 98% 100% 100% 99%  Weight:      Height:        Intake/Output Summary (Last 24 hours) at 05/14/2020 1611 Last data  filed at 05/14/2020 0132 Gross per 24 hour  Intake 550 ml  Output 50 ml  Net 500 ml   Filed Weights   05/14/20 0311  Weight: (!) 158.1 kg    Examination:  General exam: Appears calm.  Patient is morbidly obese. Respiratory system: Clear to auscultation.  Cardiovascular system: S1 & S2 Gastrointestinal system: Abdomen is  morbidly obese, soft and nontender.  Organs are difficult to assess.  Central nervous system: Alert and oriented. No focal neurological deficits. Extremities: Patient moves all extremities.  Right lower extremity is bandaged.  Left forearm AV graft.     Estimated Creatinine Clearance: 11.8 mL/min (A) (by C-G formula based on SCr of 7.41 mg/dL (H)). Liver Function Tests: No results for input(s): AST, ALT, ALKPHOS, BILITOT, PROT, ALBUMIN in the last 168 hours. No results for input(s): LIPASE, AMYLASE in the last 168 hours. No results for input(s): AMMONIA in the last 168 hours. Coagulation Profile: Recent Labs  Lab 05/13/20 1718  INR 1.7*   Cardiac Enzymes: No results for input(s): CKTOTAL, CKMB, CKMBINDEX, TROPONINI in the last 168 hours. BNP (last 3 results) No results for input(s): PROBNP in the last 8760 hours. HbA1C: No results for input(s): HGBA1C in the last 72 hours. CBG: Recent Labs  Lab 05/14/20 0416 05/14/20 0719 05/14/20 0924 05/14/20 1122 05/14/20 1311  GLUCAP 180* 209* 200* 273* 273*   Lipid Profile: No results for input(s): CHOL, HDL, LDLCALC, TRIG, CHOLHDL, LDLDIRECT in the last 72 hours. Thyroid Function Tests: No results for input(s): TSH, T4TOTAL, FREET4, T3FREE, THYROIDAB in the last 72 hours. Anemia Panel: No results for input(s): VITAMINB12, FOLATE, FERRITIN, TIBC, IRON, RETICCTPCT in the last 72 hours. Urine analysis: No results found for: COLORURINE, APPEARANCEUR, LABSPEC, PHURINE, GLUCOSEU, HGBUR, BILIRUBINUR, KETONESUR, PROTEINUR, UROBILINOGEN, NITRITE, LEUKOCYTESUR Sepsis Labs: @LABRCNTIP (procalcitonin:4,lacticidven:4)  ) Recent Results (from the past 240 hour(s))  Respiratory Panel by RT PCR (Flu A&B, Covid) - Nasopharyngeal Swab     Status: None   Collection Time: 05/13/20  5:07 PM   Specimen: Nasopharyngeal Swab  Result Value Ref Range Status   SARS Coronavirus 2 by RT PCR NEGATIVE NEGATIVE Final    Comment: (NOTE) SARS-CoV-2 target  nucleic acids are NOT DETECTED.  The SARS-CoV-2 RNA is generally detectable in upper respiratoy specimens during the acute phase of infection. The lowest concentration of SARS-CoV-2 viral copies this assay can detect is 131 copies/mL. A negative result does not preclude SARS-Cov-2 infection and should not be used as the sole basis for treatment or other patient management decisions. A negative result may occur with  improper specimen collection/handling, submission of specimen other than nasopharyngeal swab, presence of viral mutation(s) within the areas targeted by this assay, and inadequate number of viral copies (<131 copies/mL). A negative result must be combined with clinical observations, patient history, and epidemiological information. The expected result is Negative.  Fact Sheet for Patients:  PinkCheek.be  Fact Sheet for Healthcare Providers:  GravelBags.it  This test is no t yet approved or cleared by the [place] FDA and  has been authorized for detection and/or diagnosis of SARS-CoV-2 by FDA under an Emergency Use Authorization (EUA). This EUA will remain  in effect (meaning this test can be used) for the duration of the COVID-19 declaration under Section 564(b)(1) of the Act, 21 U.S.C. section 360bbb-3(b)(1), unless the authorization is terminated or revoked sooner.     Influenza A by PCR NEGATIVE NEGATIVE Final   Influenza B by PCR NEGATIVE NEGATIVE Final    Comment: (  NOTE) The Xpert Xpress SARS-CoV-2/FLU/RSV assay is intended as an aid in  the diagnosis of influenza from Nasopharyngeal swab specimens and  should not be used as a sole basis for treatment. Nasal washings and  aspirates are unacceptable for Xpert Xpress SARS-CoV-2/FLU/RSV  testing.  Fact Sheet for Patients: PinkCheek.be  Fact Sheet for Healthcare  Providers: GravelBags.it  This test is not yet approved or cleared by the [place] FDA and  has been authorized for detection and/or diagnosis of SARS-CoV-2 by  FDA under an Emergency Use Authorization (EUA). This EUA will remain  in effect (meaning this test can be used) for the duration of the  Covid-19 declaration under Section 564(b)(1) of the Act, 21  U.S.C. section 360bbb-3(b)(1), unless the authorization is  terminated or revoked. Performed at [facility], [place] [address] [place], [place] [address]        Scheduled Meds: . Chlorhexidine Gluconate Cloth  6 each Topical Q0600  . collagenase   Topical Daily  . insulin aspart  10 Units Intravenous Once   Followed by  . dextrose  50 mL Intravenous Once  . [START ON 05/16/2020] doxercalciferol  2 mcg Intravenous Q M,W,F-HD  . doxycycline  100 mg Oral BID  . insulin aspart  0-6 Units Subcutaneous Q4H  . midodrine  10 mg Oral Q M,W,F-HD  . montelukast  10 mg Oral Daily  . pravastatin  40 mg Oral Daily  . sodium zirconium cyclosilicate  10 g Oral Daily   Continuous Infusions:   LOS: 1 day    Time spent: 35 minutes    Dana Allan, MD  [facility] Pager #: [phone] 7PM-7AM contact night coverage as above

## 2020-05-14 NOTE — Anesthesia Postprocedure Evaluation (Signed)
Anesthesia Post Note  Patient: Suzanne Levy  Procedure(s) Performed: EVACUATION HEMATOMA (Right Leg Upper) DEBRIDEMENT AND CLOSURE WOUND (Right )     Patient location during evaluation: PACU Anesthesia Type: General Level of consciousness: awake Pain management: pain level controlled Vital Signs Assessment: post-procedure vital signs reviewed and stable Respiratory status: spontaneous breathing, nonlabored ventilation, respiratory function stable and patient connected to nasal cannula oxygen Cardiovascular status: blood pressure returned to baseline and stable Postop Assessment: no apparent nausea or vomiting Anesthetic complications: no   No complications documented.  Last Vitals:  Vitals:   05/14/20 0311 05/14/20 0515  BP: (!) 99/46 (!) 92/57  Pulse: (!) 101 94  Resp: 12 15  Temp: 36.7 C 36.4 C  SpO2: 99% 98%    Last Pain:  Vitals:   05/14/20 0515  TempSrc: Oral  PainSc: Asleep                 Kimmie Doren P Leen Tworek

## 2020-05-14 NOTE — Progress Notes (Signed)
1 Day Post-Op   Subjective/Chief Complaint: R calf hurts   Objective: Vital signs in last 24 hours: Temp:  [97.6 F (36.4 C)-98.4 F (36.9 C)] 97.6 F (36.4 C) (10/04 0700) Pulse Rate:  [59-108] 97 (10/04 0700) Resp:  [11-26] 12 (10/04 0700) BP: (77-144)/(17-110) 130/17 (10/04 0700) SpO2:  [59 %-100 %] 100 % (10/04 0700) Weight:  [158.1 kg] 158.1 kg (10/04 0311) Last BM Date: 05/14/20  Intake/Output from previous day: 10/03 0701 - 10/04 0700 In: 550 [I.V.:500; IV Piggyback:50] Out: 50 [Blood:50] Intake/Output this shift: No intake/output data recorded.  Extremities: dressing changed, mild drainage, ecchymosis evolving, no sig cellulitis  Lab Results:  Recent Labs    05/13/20 1718 05/13/20 1718 05/14/20 0036 05/14/20 0711  WBC 17.4*  --   --  16.8*  HGB 6.6*   < > 7.5* 7.1*  HCT 22.7*   < > 22.0* 23.6*  PLT 180  --   --  162   < > = values in this interval not displayed.   BMET Recent Labs    05/13/20 1718 05/13/20 1718 05/14/20 0036 05/14/20 0711  NA 132*   < > 130* 131*  K 5.9*   < > 5.8* 6.3*  CL 88*  --   --  88*  CO2 23  --   --  20*  GLUCOSE 177*  --   --  229*  BUN 43*  --   --  52*  CREATININE 7.27*  --   --  7.41*  CALCIUM 9.0  --   --  9.1   < > = values in this interval not displayed.   PT/INR Recent Labs    05/13/20 1718  LABPROT 18.9*  INR 1.7*   ABG Recent Labs    05/14/20 0036  PHART 7.388  HCO3 24.4    Studies/Results: No results found.  Anti-infectives: Anti-infectives (From admission, onward)   Start     Dose/Rate Route Frequency Ordered Stop   05/14/20 1000  doxycycline (VIBRA-TABS) tablet 100 mg        100 mg Oral 2 times daily 05/13/20 2038     05/14/20 1000  linezolid (ZYVOX) tablet 600 mg        600 mg Oral 2 times daily 05/13/20 2038     05/13/20 1945  clindamycin (CLEOCIN) IVPB 900 mg        900 mg 100 mL/hr over 30 Minutes Intravenous On call to O.R. 05/13/20 1933 05/14/20 0040      Assessment/Plan: S/P  evacuation of hematoma and bleeding control R calf 10/4 by Dr. Kieth Brightly - wound looks OK - daily dressing changes - on Doxy and Zyvox - holding Eliquis   LOS: 1 day    Zenovia Jarred 05/14/2020

## 2020-05-14 NOTE — Consult Note (Signed)
WOC Nurse Consult Note: Patient receiving care in [place] Reason for Consult: Pannus and left ankle wounds, Surgery following for right leg wound. Wound type: Right upper thigh full thickness wound, measures 1.5 cm x 3.5 cm x 1.2. 100% yellow slough, moderate amount of tan drainage.  Left outer ankle: Stage 4, bone palpable. Measures 2 cm x 2 cm x 1.5 cm. 10% red and 90% yellow slough with moderate amount of tan pus and some odor.  Left outer pannus: 10 cm x 10 cm. Hard to palpation, painful to touch. Center of wound measures 6.5 cm x 5 cm x 1.8 cm. 100% yellow, green slough, foul smelling odor.  Dressing procedure/placement/frequency: Right upper thigh: Apply Santyl to right upper thigh in a nickel thick layer. Cover with a saline moistened gauze, then dry gauze or ABD pad.  Change daily.  Left lower outer ankle: Place Aquacel Kellie Simmering # [id number]) in left outer ankle wound and cover with foam dressing. Change daily  Left outer pannus: recommend CT scan to rule out abscess and surgical consult for evaluation of the site. Secure chat message sent to primary team to inform them.  Recommend X-Ray to rule out osteomyelitis of the left ankle because the patient has exposed bone and pus.   Cathlean Marseilles Tamala Julian, MSN, RN, [place], Lysle Pearl, [facility] Wound Treatment Associate Pager [phone]

## 2020-05-14 NOTE — Anesthesia Procedure Notes (Signed)
Procedure Name: Intubation Date/Time: 05/14/2020 12:17 AM Performed by: Suzy Bouchard, CRNA Pre-anesthesia Checklist: Timeout performed, Patient being monitored, Suction available, Emergency Drugs available and Patient identified Patient Re-evaluated:Patient Re-evaluated prior to induction Oxygen Delivery Method: Circle system utilized Preoxygenation: Pre-oxygenation with 100% oxygen Induction Type: IV induction Ventilation: Mask ventilation without difficulty Laryngoscope Size: Glidescope and 3 Grade View: Grade I Tube type: Oral Tube size: 7.0 mm Number of attempts: 1 Airway Equipment and Method: Stylet and Video-laryngoscopy Placement Confirmation: ETT inserted through vocal cords under direct vision,  positive ETCO2 and breath sounds checked- equal and bilateral Secured at: 21 cm Tube secured with: Tape Dental Injury: Teeth and Oropharynx as per pre-operative assessment

## 2020-05-14 NOTE — Op Note (Signed)
Preoperative diagnosis: ruptured hematoma  Postoperative diagnosis: same   Procedure: evacuation of hematoma from right leg, cauterization of bleeding areas, closure of 20 cm of wounds  Surgeon: Gurney Maxin, M.D.  Asst: none  Anesthesia: general  Indications for procedure: Suzanne Levy is a 69 y.o. year old female with symptoms of ruptured hematoma of right leg. She notes bumping it 2 days ago, had a bruise and then noticed a large blood clot this morning.  Description of procedure: The patient was brought into the operative suite. Anesthesia was administered with General endotracheal anesthesia. WHO checklist was applied. The patient was then placed in supine position. The area was prepped and draped in the usual sterile fashion.  Next, the hematoma was evacuated with gentle pressure behind it. This tore the superior portion of her very thin skin. The area was irrigated. Cautery was used for hemostasis. There was a slow ooze from the anterior aspect of the leg near the knee, this was packed with good result so a piece of snow was placed. The wounds were then apposed with loose staples. The patient awoke from anesthesia and was brought to pacu in stable condition. All counts were correct.  Findings: 1 l mature hematoma in right leg  Specimen: none  Implant: none   Blood loss: 50 ml  Local anesthesia: none  Complications: none  Gurney Maxin, M.D. General, Bariatric, & Minimally Invasive Surgery [facility]

## 2020-05-14 NOTE — Plan of Care (Signed)
  Problem: Education: Goal: Knowledge of General Education information will improve Description: Including pain rating scale, medication(s)/side effects and non-pharmacologic comfort measures Outcome: Progressing   Problem: Clinical Measurements: Goal: Diagnostic test results will improve Outcome: Progressing   Problem: Coping: Goal: Level of anxiety will decrease Outcome: Progressing   Problem: Pain Managment: Goal: General experience of comfort will improve Outcome: Progressing   Problem: Skin Integrity: Goal: Risk for impaired skin integrity will decrease Outcome: Progressing

## 2020-05-15 ENCOUNTER — Inpatient Hospital Stay (HOSPITAL_COMMUNITY): Payer: Medicare Other

## 2020-05-15 DIAGNOSIS — S82842A Displaced bimalleolar fracture of left lower leg, initial encounter for closed fracture: Secondary | ICD-10-CM | POA: Diagnosis not present

## 2020-05-15 DIAGNOSIS — D62 Acute posthemorrhagic anemia: Secondary | ICD-10-CM | POA: Diagnosis not present

## 2020-05-15 DIAGNOSIS — S8011XA Contusion of right lower leg, initial encounter: Secondary | ICD-10-CM | POA: Diagnosis not present

## 2020-05-15 DIAGNOSIS — N186 End stage renal disease: Secondary | ICD-10-CM | POA: Diagnosis not present

## 2020-05-15 LAB — BPAM RBC
Blood Product Expiration Date: 202110072359
Blood Product Expiration Date: 202110302359
ISSUE DATE / TIME: 202110032350
ISSUE DATE / TIME: 202110041740
Unit Type and Rh: 5100
Unit Type and Rh: 5100

## 2020-05-15 LAB — CBC WITH DIFFERENTIAL/PLATELET
Abs Immature Granulocytes: 0.23 10*3/uL — ABNORMAL HIGH (ref 0.00–0.07)
Basophils Absolute: 0 10*3/uL (ref 0.0–0.1)
Basophils Relative: 0 %
Eosinophils Absolute: 0.1 10*3/uL (ref 0.0–0.5)
Eosinophils Relative: 1 %
HCT: 24.6 % — ABNORMAL LOW (ref 36.0–46.0)
Hemoglobin: 7.7 g/dL — ABNORMAL LOW (ref 12.0–15.0)
Immature Granulocytes: 2 %
Lymphocytes Relative: 5 %
Lymphs Abs: 0.7 10*3/uL (ref 0.7–4.0)
MCH: 29.3 pg (ref 26.0–34.0)
MCHC: 31.3 g/dL (ref 30.0–36.0)
MCV: 93.5 fL (ref 80.0–100.0)
Monocytes Absolute: 0.9 10*3/uL (ref 0.1–1.0)
Monocytes Relative: 6 %
Neutro Abs: 12.6 10*3/uL — ABNORMAL HIGH (ref 1.7–7.7)
Neutrophils Relative %: 86 %
Platelets: 133 10*3/uL — ABNORMAL LOW (ref 150–400)
RBC: 2.63 MIL/uL — ABNORMAL LOW (ref 3.87–5.11)
RDW: 22.1 % — ABNORMAL HIGH (ref 11.5–15.5)
WBC: 14.5 10*3/uL — ABNORMAL HIGH (ref 4.0–10.5)
nRBC: 0.7 % — ABNORMAL HIGH (ref 0.0–0.2)

## 2020-05-15 LAB — RENAL FUNCTION PANEL
Albumin: 2.3 g/dL — ABNORMAL LOW (ref 3.5–5.0)
Anion gap: 19 — ABNORMAL HIGH (ref 5–15)
BUN: 30 mg/dL — ABNORMAL HIGH (ref 8–23)
CO2: 22 mmol/L (ref 22–32)
Calcium: 8.4 mg/dL — ABNORMAL LOW (ref 8.9–10.3)
Chloride: 92 mmol/L — ABNORMAL LOW (ref 98–111)
Creatinine, Ser: 4.55 mg/dL — ABNORMAL HIGH (ref 0.44–1.00)
GFR calc Af Amer: 11 mL/min — ABNORMAL LOW (ref >60)
GFR calc non Af Amer: 9 mL/min — ABNORMAL LOW (ref >60)
Glucose, Bld: 198 mg/dL — ABNORMAL HIGH (ref 70–99)
Phosphorus: 5.1 mg/dL — ABNORMAL HIGH (ref 2.5–4.6)
Potassium: 4.8 mmol/L (ref 3.5–5.1)
Sodium: 133 mmol/L — ABNORMAL LOW (ref 135–145)

## 2020-05-15 LAB — TYPE AND SCREEN
ABO/RH(D): O POS
Antibody Screen: NEGATIVE
Unit division: 0
Unit division: 0

## 2020-05-15 LAB — GLUCOSE, CAPILLARY
Glucose-Capillary: 169 mg/dL — ABNORMAL HIGH (ref 70–99)
Glucose-Capillary: 192 mg/dL — ABNORMAL HIGH (ref 70–99)
Glucose-Capillary: 203 mg/dL — ABNORMAL HIGH (ref 70–99)
Glucose-Capillary: 206 mg/dL — ABNORMAL HIGH (ref 70–99)
Glucose-Capillary: 225 mg/dL — ABNORMAL HIGH (ref 70–99)
Glucose-Capillary: 262 mg/dL — ABNORMAL HIGH (ref 70–99)

## 2020-05-15 LAB — MAGNESIUM: Magnesium: 2 mg/dL (ref 1.7–2.4)

## 2020-05-15 MED ORDER — INSULIN DETEMIR 100 UNIT/ML ~~LOC~~ SOLN
10.0000 [IU] | Freq: Every day | SUBCUTANEOUS | Status: DC
  Administered 2020-05-15: 10 [IU] via SUBCUTANEOUS
  Filled 2020-05-15 (×2): qty 0.1

## 2020-05-15 NOTE — Plan of Care (Signed)
  Problem: Clinical Measurements: Goal: Respiratory complications will improve Outcome: Progressing Goal: Cardiovascular complication will be avoided Outcome: Progressing   Problem: Nutrition: Goal: Adequate nutrition will be maintained Outcome: Progressing   

## 2020-05-15 NOTE — Progress Notes (Signed)
Inpatient Diabetes Program Recommendations  AACE/ADA: New Consensus Statement on Inpatient Glycemic Control (2015)  Target Ranges:  Prepandial:   less than 140 mg/dL      Peak postprandial:   less than 180 mg/dL (1-2 hours)      Critically ill patients:  140 - 180 mg/dL   Lab Results  Component Value Date   GLUCAP 206 (H) 05/15/2020   HGBA1C 7.1 (H) 03/01/2020    Review of Glycemic Control Results for ANA, WOODROOF (MRN [id number]) as of 05/15/2020 12:39  Ref. Range 05/14/2020 20:05 05/15/2020 00:12 05/15/2020 04:58 05/15/2020 08:00 05/15/2020 12:16  Glucose-Capillary Latest Ref Range: 70 - 99 mg/dL 186 (H) 203 (H) 192 (H) 169 (H) 206 (H)  Diabetes history: DM 2 Outpatient Diabetes medications:  70/30 - 15 units tid with meals NPH 36 units q AM and 20 units q PM Current orders for Inpatient glycemic control:  Novolog 0-6 units q 4 hours Inpatient Diabetes Program Recommendations:   Please consider adding Levemir 10 units daily.  Thanks,  Adah Perl, RN, BC-ADM Inpatient Diabetes Coordinator Pager [phone] (8a-5p)

## 2020-05-15 NOTE — Progress Notes (Signed)
This RN attempted daily dressing changes for pressure wounds of pannus, right thigh, and left ankle. Patient would only allow me to change right thigh dressing. Would not let me attempt to change others due to pain with the slightest movement. Will pass this information along to dayshift RN. Will continue to monitor patient.

## 2020-05-15 NOTE — Progress Notes (Signed)
[facility] Progress Note  2 Days Post-Op  Subjective: CC-  Complaining of pain everywhere. Has refused some dressing changes. Also refused u/s to eval wound under pannus yesterday. Hgb 7.7 from 7.1 WBC down 14.5, afebrile  Objective: Vital signs in last 24 hours: Temp:  [97.6 F (36.4 C)-98.5 F (36.9 C)] 98.3 F (36.8 C) (10/05 0733) Pulse Rate:  [93-103] 94 (10/05 0926) Resp:  [13-20] 17 (10/05 0926) BP: (76-118)/(32-92) 113/44 (10/05 0926) SpO2:  [94 %-100 %] 97 % (10/05 0926) Weight:  [157 kg-159.4 kg] 159.4 kg (10/05 0342) Last BM Date: 05/14/20  Intake/Output from previous day: 10/04 0701 - 10/05 0700 In: 120 [P.O.:120] Out: 2063  Intake/Output this shift: Total I/O In: 892.5 [P.O.:120; Blood:772.5] Out: -   PE: Gen:  Alert, NAD Pulm:  rate and effort normal Abd: obese, soft, wound under left lateral pannus with about 5x5cm area of fibrinous tissue and surrounding induration, no cellulitis, pin point size open with no drainage Ext:  Right thigh wound loosely closed with staples, trace serosanguinous drainage, surrounding ecchymosis but no cellulitis     Lab Results:  Recent Labs    05/14/20 0711 05/15/20 0039  WBC 16.8* 14.5*  HGB 7.1* 7.7*  HCT 23.6* 24.6*  PLT 162 133*   BMET Recent Labs    05/14/20 0711 05/15/20 0039  NA 131* 133*  K 6.3* 4.8  CL 88* 92*  CO2 20* 22  GLUCOSE 229* 198*  BUN 52* 30*  CREATININE 7.41* 4.55*  CALCIUM 9.1 8.4*   PT/INR Recent Labs    05/13/20 1718  LABPROT 18.9*  INR 1.7*   CMP     Component Value Date/Time   NA 133 (L) 05/15/2020 0039   K 4.8 05/15/2020 0039   CL 92 (L) 05/15/2020 0039   CO2 22 05/15/2020 0039   GLUCOSE 198 (H) 05/15/2020 0039   BUN 30 (H) 05/15/2020 0039   CREATININE 4.55 (H) 05/15/2020 0039   CALCIUM 8.4 (L) 05/15/2020 0039   PROT 7.7 04/04/2020 1626   ALBUMIN 2.3 (L) 05/15/2020 0039   AST 15 04/04/2020 1626   ALT 15 04/04/2020 1626   ALKPHOS 132 (H)  04/04/2020 1626   BILITOT 0.4 04/04/2020 1626   GFRNONAA 9 (L) 05/15/2020 0039   GFRAA 11 (L) 05/15/2020 0039   Lipase     Component Value Date/Time   LIPASE 25 04/04/2020 1626       Studies/Results: DG Ankle Complete Left  Result Date: 05/14/2020 CLINICAL DATA:  Osteomyelitis. EXAM: LEFT ANKLE COMPLETE - 3+ VIEW COMPARISON:  None. FINDINGS: Evaluation is significantly limited by patient positioning. There is severe osteopenia which limits detection of nondisplaced fractures. There are age-indeterminate fractures of the medial and lateral malleoli. There is an impacted fracture of the distal tibia. There is extensive surrounding soft tissue swelling. There is lucency about the medial malleolar fracture of unknown clinical significance. Extensive vascular calcifications are noted. There are areas of sclerosis involving the calcaneus which may represent heterogeneous osteopenia versus an insufficiency fracture. There is a large ankle joint effusion. There is a questionable nondisplaced fracture through the base of the fifth metatarsal. IMPRESSION: 1. Examination is severely limited by patient positioning and diffuse osteopenia. 2. There are age-indeterminate displaced fractures of the lateral and medial malleoli in addition to a probable impacted fracture of the tibial plafond. Further evaluation with cross-sectional imaging would be useful for further evaluation given the presence of osteopenia. 3. There is extensive soft tissue swelling about the ankle with  a probable large joint effusion. 4. There is a lucency at the level of the medial malleolar fracture. This is of unknown clinical significance. Follow-up with cross-sectional imaging is recommended. 5. Advanced vascular calcifications are noted. 6. Questionable nondisplaced fracture through the base of the fifth metatarsal. Electronically Signed   By: Constance Holster M.D.   On: 05/14/2020 21:09    Anti-infectives: Anti-infectives (From  admission, onward)   Start     Dose/Rate Route Frequency Ordered Stop   05/14/20 2200  doxycycline (VIBRA-TABS) tablet 100 mg        100 mg Oral 2 times daily 05/14/20 1017 05/17/20 2359   05/14/20 1000  doxycycline (VIBRA-TABS) tablet 100 mg  Status:  Discontinued        100 mg Oral 2 times daily 05/13/20 2038 05/14/20 1017   05/14/20 1000  linezolid (ZYVOX) tablet 600 mg  Status:  Discontinued        600 mg Oral 2 times daily 05/13/20 2038 05/14/20 1017   05/13/20 1945  clindamycin (CLEOCIN) IVPB 900 mg        900 mg 100 mL/hr over 30 Minutes Intravenous On call to O.R. 05/13/20 1933 05/14/20 0040       Assessment/Plan ESRD Hypotension HLD Asthma DM-2 Morbid obesity BMI 49 H/o PE/DVT on eliquis ABL anemia - Hgb 7.7 from 7.1  Pannus wound - noted on left lateral side. Will obtain u/s to evaluate for underlying abscess; patient refused this yesterday but willing to try today so I called radiology to reschedule. Wound care per WOC  Ruptured hematoma -S/p evacuation of hematoma from right leg, cauterization of bleeding areas, closure of 20 cm of wounds 10/4 Dr. Kieth Brightly - POD#1 - Wound looks ok without signs of infection. Change dry dressing BID. On doxycycline. Continue holding eliquis.  ID - doxycycline 10/4>>, linezolid 10/4, cleocin 10/3>>10/4 FEN - CM diet VTE - SCD Foley - none   LOS: 2 days    Wellington [place], [facility] 05/15/2020, 10:34 AM Please see Amion for pager number during day hours 7:00am-4:30pm

## 2020-05-15 NOTE — Progress Notes (Signed)
Patient ID: Suzanne Levy, female   DOB: March 15, 1951, 69 y.o.   MRN: [id number]  PROGRESS NOTE    Suzanne Levy  QQI:[id number] DOB: 03-Dec-1950 DOA: 05/13/2020 PCP: [name], MD   Brief Narrative:  69 year old female, morbidly obese, with past medical history significant for ESRD on MWF HD, history of PE on Eliquis, insulin-dependent type 2 diabetes, hypertension, hyperlipidemia, morbid obesity and asthma who is wheelchair-bound was admitted with ruptured hematoma of the right lower leg.  She underwent evacuation of the hematoma, cauterization of bleeding areas and closure of the wound by general surgery.  Nephrology is following for dialysis needs.  Assessment & Plan:   Ruptured hematoma of the right lower extremity -Occurred in the setting of anticoagulation use -Status post evacuation of the hematoma, cauterization of the bleeding vessels and closure of the large wound by general surgery -Currently on doxycycline. -Eliquis still on hold. -Wound care as per general surgery recommendations  Acute blood loss anemia -Due to above.  Status post 2 units packed red cells transfusion.  Hemoglobin 7.7 today.  End-stage renal disease on hemodialysis -Nephrology following.  Hemodialysis as per nephrology schedule and recommendations  Hyperkalemia -Currently on Lokelma.  Resolved.  Nephrology following.  History of PE/DVT on Eliquis -Eliquis on hold  Insulin-dependent diabetes mellitus type 2 uncontrolled with hyperglycemia -Continue CBGs with SSI.  Will start Lantus 10 units daily.  Leukocytosis -Monitor  Morbid obesity/OSA/OHS -Continue CPAP at bedtime.  Left ankle and pannus wounds -Recent admission at [facility] from 04/11/2020-04/21/2020 requiring debridement of right flank and groin wounds on 04/14/2020.  On oral antibiotics as an outpatient -Wound care team input is appreciated.  General surgery following  Left foot fractures -X-ray of the left ankle is showing bimalleolar  fracture.  Consult orthopedics  Generalized deconditioning -Overall prognosis is guarded to poor.  Palliative care consultation for goals of care discussion   DVT prophylaxis: SCDs Code Status: Full Family Communication: None at bedside Disposition Plan: Status is: Inpatient  Remains inpatient appropriate because:Inpatient level of care appropriate due to severity of illness.  Might need SNF placement.   Dispo: The patient is from: Home              Anticipated d/c is to: SNF              Anticipated d/c date is: 3 days              Patient currently is not medically stable to d/c.  Consultants: General surgery/nephrology.  Will consult orthopedics and palliative care  Procedures: Evacuation of hematoma from right leg, cauterization of bleeding areas, closure of 20 cm of wounds on 05/14/2020 by general surgery  Antimicrobials:  Anti-infectives (From admission, onward)   Start     Dose/Rate Route Frequency Ordered Stop   05/14/20 2200  doxycycline (VIBRA-TABS) tablet 100 mg        100 mg Oral 2 times daily 05/14/20 1017 05/17/20 2359   05/14/20 1000  doxycycline (VIBRA-TABS) tablet 100 mg  Status:  Discontinued        100 mg Oral 2 times daily 05/13/20 2038 05/14/20 1017   05/14/20 1000  linezolid (ZYVOX) tablet 600 mg  Status:  Discontinued        600 mg Oral 2 times daily 05/13/20 2038 05/14/20 1017   05/13/20 1945  clindamycin (CLEOCIN) IVPB 900 mg        900 mg 100 mL/hr over 30 Minutes Intravenous On call to O.R. 05/13/20 1933 05/14/20 0040  Subjective: Patient seen and examined at bedside.  Poor historian.  No overnight fever, vomiting or worsening shortness of breath.  Objective: Vitals:   05/15/20 0342 05/15/20 0733 05/15/20 0926 05/15/20 1044  BP: (!) 84/51 (!) 89/34 (!) 113/44 (!) 87/51  Pulse: (!) 103 93 94 99  Resp: 14 14 17 14   Temp: 98.4 F (36.9 C) 98.3 F (36.8 C)  97.9 F (36.6 C)  TempSrc: Oral Oral  Oral  SpO2: 94% 99% 97% 95%  Weight: (!)  159.4 kg     Height:        Intake/Output Summary (Last 24 hours) at 05/15/2020 1312 Last data filed at 05/15/2020 0915 Gross per 24 hour  Intake 1012.5 ml  Output 2063 ml  Net -1050.5 ml   Filed Weights   05/14/20 0311 05/14/20 1900 05/15/20 0342  Weight: (!) 158.1 kg (!) 157 kg (!) 159.4 kg    Examination:  General exam: Sleepy, wakes up slightly.  Looks older than stated age.  Looks chronically ill.  Poor historian. Respiratory system: Bilateral decreased breath sounds at bases with scattered crackles Cardiovascular system: S1 & S2 heard, Rate controlled Gastrointestinal system: Abdomen is morbidly obese, nondistended, soft and nontender. Normal bowel sounds heard. Extremities: No cyanosis, clubbing; right thigh dressing present. Central nervous system: Poor historian.  Answers only a few questions.  No focal neurological deficits. Moving extremities Skin: No other ecchymosis psychiatry: Flat affect  Data Reviewed: I have personally reviewed following labs and imaging studies  CBC: Recent Labs  Lab 05/13/20 1449 05/13/20 1718 05/14/20 0036 05/14/20 0711 05/15/20 0039  WBC  --  17.4*  --  16.8* 14.5*  NEUTROABS  --   --   --   --  12.6*  HGB 9.2* 6.6* 7.5* 7.1* 7.7*  HCT 27.0* 22.7* 22.0* 23.6* 24.6*  MCV  --  100.0  --  98.7 93.5  PLT  --  180  --  162 431*   Basic Metabolic Panel: Recent Labs  Lab 05/13/20 1449 05/13/20 1718 05/14/20 0036 05/14/20 0711 05/15/20 0039  NA 131* 132* 130* 131* 133*  K 5.7* 5.9* 5.8* 6.3* 4.8  CL 93* 88*  --  88* 92*  CO2  --  23  --  20* 22  GLUCOSE 154* 177*  --  229* 198*  BUN 40* 43*  --  52* 30*  CREATININE 7.10* 7.27*  --  7.41* 4.55*  CALCIUM  --  9.0  --  9.1 8.4*  MG  --   --   --   --  2.0  PHOS  --   --   --   --  5.1*   GFR: Estimated Creatinine Clearance: 19.3 mL/min (A) (by C-G formula based on SCr of 4.55 mg/dL (H)). Liver Function Tests: Recent Labs  Lab 05/15/20 0039  ALBUMIN 2.3*   No results for  input(s): LIPASE, AMYLASE in the last 168 hours. No results for input(s): AMMONIA in the last 168 hours. Coagulation Profile: Recent Labs  Lab 05/13/20 1718  INR 1.7*   Cardiac Enzymes: No results for input(s): CKTOTAL, CKMB, CKMBINDEX, TROPONINI in the last 168 hours. BNP (last 3 results) No results for input(s): PROBNP in the last 8760 hours. HbA1C: No results for input(s): HGBA1C in the last 72 hours. CBG: Recent Labs  Lab 05/14/20 2005 05/15/20 0012 05/15/20 0458 05/15/20 0800 05/15/20 1216  GLUCAP 186* 203* 192* 169* 206*   Lipid Profile: No results for input(s): CHOL, HDL, LDLCALC, TRIG, CHOLHDL, LDLDIRECT in  the last 72 hours. Thyroid Function Tests: No results for input(s): TSH, T4TOTAL, FREET4, T3FREE, THYROIDAB in the last 72 hours. Anemia Panel: No results for input(s): VITAMINB12, FOLATE, FERRITIN, TIBC, IRON, RETICCTPCT in the last 72 hours. Sepsis Labs: No results for input(s): PROCALCITON, LATICACIDVEN in the last 168 hours.  Recent Results (from the past 240 hour(s))  Respiratory Panel by RT PCR (Flu A&B, Covid) - Nasopharyngeal Swab     Status: None   Collection Time: 05/13/20  5:07 PM   Specimen: Nasopharyngeal Swab  Result Value Ref Range Status   SARS Coronavirus 2 by RT PCR NEGATIVE NEGATIVE Final    Comment: (NOTE) SARS-CoV-2 target nucleic acids are NOT DETECTED.  The SARS-CoV-2 RNA is generally detectable in upper respiratoy specimens during the acute phase of infection. The lowest concentration of SARS-CoV-2 viral copies this assay can detect is 131 copies/mL. A negative result does not preclude SARS-Cov-2 infection and should not be used as the sole basis for treatment or other patient management decisions. A negative result may occur with  improper specimen collection/handling, submission of specimen other than nasopharyngeal swab, presence of viral mutation(s) within the areas targeted by this assay, and inadequate number of viral  copies (<131 copies/mL). A negative result must be combined with clinical observations, patient history, and epidemiological information. The expected result is Negative.  Fact Sheet for Patients:  PinkCheek.be  Fact Sheet for Healthcare Providers:  GravelBags.it  This test is no t yet approved or cleared by the [place] FDA and  has been authorized for detection and/or diagnosis of SARS-CoV-2 by FDA under an Emergency Use Authorization (EUA). This EUA will remain  in effect (meaning this test can be used) for the duration of the COVID-19 declaration under Section 564(b)(1) of the Act, 21 U.S.C. section 360bbb-3(b)(1), unless the authorization is terminated or revoked sooner.     Influenza A by PCR NEGATIVE NEGATIVE Final   Influenza B by PCR NEGATIVE NEGATIVE Final    Comment: (NOTE) The Xpert Xpress SARS-CoV-2/FLU/RSV assay is intended as an aid in  the diagnosis of influenza from Nasopharyngeal swab specimens and  should not be used as a sole basis for treatment. Nasal washings and  aspirates are unacceptable for Xpert Xpress SARS-CoV-2/FLU/RSV  testing.  Fact Sheet for Patients: PinkCheek.be  Fact Sheet for Healthcare Providers: GravelBags.it  This test is not yet approved or cleared by the [place] FDA and  has been authorized for detection and/or diagnosis of SARS-CoV-2 by  FDA under an Emergency Use Authorization (EUA). This EUA will remain  in effect (meaning this test can be used) for the duration of the  Covid-19 declaration under Section 564(b)(1) of the Act, 21  U.S.C. section 360bbb-3(b)(1), unless the authorization is  terminated or revoked. Performed at [facility], [place] [address], [place], [place] [address]          Radiology Studies: DG Ankle Complete Left  Result Date: 05/14/2020 CLINICAL DATA:  Osteomyelitis. EXAM:  LEFT ANKLE COMPLETE - 3+ VIEW COMPARISON:  None. FINDINGS: Evaluation is significantly limited by patient positioning. There is severe osteopenia which limits detection of nondisplaced fractures. There are age-indeterminate fractures of the medial and lateral malleoli. There is an impacted fracture of the distal tibia. There is extensive surrounding soft tissue swelling. There is lucency about the medial malleolar fracture of unknown clinical significance. Extensive vascular calcifications are noted. There are areas of sclerosis involving the calcaneus which may represent heterogeneous osteopenia versus an insufficiency fracture. There  is a large ankle joint effusion. There is a questionable nondisplaced fracture through the base of the fifth metatarsal. IMPRESSION: 1. Examination is severely limited by patient positioning and diffuse osteopenia. 2. There are age-indeterminate displaced fractures of the lateral and medial malleoli in addition to a probable impacted fracture of the tibial plafond. Further evaluation with cross-sectional imaging would be useful for further evaluation given the presence of osteopenia. 3. There is extensive soft tissue swelling about the ankle with a probable large joint effusion. 4. There is a lucency at the level of the medial malleolar fracture. This is of unknown clinical significance. Follow-up with cross-sectional imaging is recommended. 5. Advanced vascular calcifications are noted. 6. Questionable nondisplaced fracture through the base of the fifth metatarsal. Electronically Signed   By: Constance Holster M.D.   On: 05/14/2020 21:09   US Abdomen Limited  Result Date: 05/15/2020 CLINICAL DATA:  Wound left lateral abdominal wall EXAM: ULTRASOUND LEFT LATERAL ABDOMINAL WALL COMPARISON:  None. FINDINGS: Longitudinal and transverse images were obtained in the left lateral abdominal wall soft tissue region. Ultrasound of this area shows soft tissue induration without mass or  fluid collection. No abscess evident by ultrasound in this region. IMPRESSION: Soft tissue induration in area lateral left abdominal wall without well-defined mass or abscess. No abnormal fluid in this area. If further evaluation of this area is felt to be warranted, CT through this area, ideally with intravenous contrast, could be helpful to further evaluate. Electronically Signed   By: Lowella Grip III M.D.   On: 05/15/2020 11:52        Scheduled Meds: . Chlorhexidine Gluconate Cloth  6 each Topical Q0600  . collagenase   Topical Daily  . insulin aspart  10 Units Intravenous Once   Followed by  . dextrose  50 mL Intravenous Once  . [START ON 05/16/2020] doxercalciferol  2 mcg Intravenous Q M,W,F-HD  . doxycycline  100 mg Oral BID  . insulin aspart  0-6 Units Subcutaneous Q4H  . midodrine  10 mg Oral Q M,W,F-HD  . montelukast  10 mg Oral Daily  . pravastatin  40 mg Oral Daily  . sodium zirconium cyclosilicate  10 g Oral Daily   Continuous Infusions:        Aline August, MD [facility] 05/15/2020, 1:12 PM

## 2020-05-15 NOTE — Plan of Care (Signed)
  Problem: Clinical Measurements: Goal: Ability to maintain clinical measurements within normal limits will improve Outcome: Progressing   Problem: Clinical Measurements: Goal: Will remain free from infection Outcome: Progressing   Problem: Clinical Measurements: Goal: Diagnostic test results will improve Outcome: Progressing   Problem: Nutrition: Goal: Adequate nutrition will be maintained Outcome: Progressing   Problem: Coping: Goal: Level of anxiety will decrease Outcome: Progressing   Problem: Skin Integrity: Goal: Risk for impaired skin integrity will decrease Outcome: Progressing   Problem: Pain Managment: Goal: General experience of comfort will improve Outcome: Progressing

## 2020-05-15 NOTE — Plan of Care (Signed)
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
## 2020-05-15 NOTE — Progress Notes (Signed)
Orthopedic Tech Progress Note Patient Details:  Suzanne Levy 1951/02/17 [id number] Tried to apply CAM WALKER BOOT earlier around 93 at the moment RN "stated" she had a rapid going on and was not able to get the dressing changed for patient before I could apply CAM WALKER. Came back to do CAM WALKER and still no wound dressing. Asked CHARGE RN and she said "she had an assignment as well and couldn't help. So I left boot in room but haven't applied it.  Patient ID: Suzanne Levy, female   DOB: [birth year], 69 y.o.   MRN: [id number]   Suzanne Levy 05/15/2020, 4:24 PM

## 2020-05-16 ENCOUNTER — Encounter (HOSPITAL_COMMUNITY): Payer: Self-pay | Admitting: Internal Medicine

## 2020-05-16 DIAGNOSIS — S8012XA Contusion of left lower leg, initial encounter: Secondary | ICD-10-CM | POA: Diagnosis not present

## 2020-05-16 DIAGNOSIS — S82842A Displaced bimalleolar fracture of left lower leg, initial encounter for closed fracture: Secondary | ICD-10-CM | POA: Diagnosis not present

## 2020-05-16 DIAGNOSIS — Z86711 Personal history of pulmonary embolism: Secondary | ICD-10-CM | POA: Diagnosis not present

## 2020-05-16 DIAGNOSIS — N186 End stage renal disease: Secondary | ICD-10-CM | POA: Diagnosis not present

## 2020-05-16 DIAGNOSIS — Z7189 Other specified counseling: Secondary | ICD-10-CM

## 2020-05-16 DIAGNOSIS — S8011XA Contusion of right lower leg, initial encounter: Secondary | ICD-10-CM | POA: Diagnosis not present

## 2020-05-16 DIAGNOSIS — L089 Local infection of the skin and subcutaneous tissue, unspecified: Secondary | ICD-10-CM

## 2020-05-16 DIAGNOSIS — T148XXA Other injury of unspecified body region, initial encounter: Secondary | ICD-10-CM

## 2020-05-16 DIAGNOSIS — Z515 Encounter for palliative care: Secondary | ICD-10-CM

## 2020-05-16 LAB — CBC WITH DIFFERENTIAL/PLATELET
Abs Immature Granulocytes: 0.21 10*3/uL — ABNORMAL HIGH (ref 0.00–0.07)
Basophils Absolute: 0 10*3/uL (ref 0.0–0.1)
Basophils Relative: 0 %
Eosinophils Absolute: 0.2 10*3/uL (ref 0.0–0.5)
Eosinophils Relative: 1 %
HCT: 25.7 % — ABNORMAL LOW (ref 36.0–46.0)
Hemoglobin: 7.8 g/dL — ABNORMAL LOW (ref 12.0–15.0)
Immature Granulocytes: 2 %
Lymphocytes Relative: 8 %
Lymphs Abs: 1 10*3/uL (ref 0.7–4.0)
MCH: 28.8 pg (ref 26.0–34.0)
MCHC: 30.4 g/dL (ref 30.0–36.0)
MCV: 94.8 fL (ref 80.0–100.0)
Monocytes Absolute: 0.9 10*3/uL (ref 0.1–1.0)
Monocytes Relative: 7 %
Neutro Abs: 10.2 10*3/uL — ABNORMAL HIGH (ref 1.7–7.7)
Neutrophils Relative %: 82 %
Platelets: 121 10*3/uL — ABNORMAL LOW (ref 150–400)
RBC: 2.71 MIL/uL — ABNORMAL LOW (ref 3.87–5.11)
RDW: 22.6 % — ABNORMAL HIGH (ref 11.5–15.5)
WBC: 12.4 10*3/uL — ABNORMAL HIGH (ref 4.0–10.5)
nRBC: 0.6 % — ABNORMAL HIGH (ref 0.0–0.2)

## 2020-05-16 LAB — BASIC METABOLIC PANEL
Anion gap: 17 — ABNORMAL HIGH (ref 5–15)
BUN: 45 mg/dL — ABNORMAL HIGH (ref 8–23)
CO2: 23 mmol/L (ref 22–32)
Calcium: 8.8 mg/dL — ABNORMAL LOW (ref 8.9–10.3)
Chloride: 89 mmol/L — ABNORMAL LOW (ref 98–111)
Creatinine, Ser: 6.01 mg/dL — ABNORMAL HIGH (ref 0.44–1.00)
GFR calc non Af Amer: 7 mL/min — ABNORMAL LOW (ref >60)
Glucose, Bld: 225 mg/dL — ABNORMAL HIGH (ref 70–99)
Potassium: 4.1 mmol/L (ref 3.5–5.1)
Sodium: 129 mmol/L — ABNORMAL LOW (ref 135–145)

## 2020-05-16 LAB — GLUCOSE, CAPILLARY
Glucose-Capillary: 163 mg/dL — ABNORMAL HIGH (ref 70–99)
Glucose-Capillary: 181 mg/dL — ABNORMAL HIGH (ref 70–99)
Glucose-Capillary: 197 mg/dL — ABNORMAL HIGH (ref 70–99)
Glucose-Capillary: 219 mg/dL — ABNORMAL HIGH (ref 70–99)
Glucose-Capillary: 225 mg/dL — ABNORMAL HIGH (ref 70–99)
Glucose-Capillary: 267 mg/dL — ABNORMAL HIGH (ref 70–99)

## 2020-05-16 MED ORDER — INSULIN DETEMIR 100 UNIT/ML ~~LOC~~ SOLN
15.0000 [IU] | Freq: Every day | SUBCUTANEOUS | Status: DC
  Administered 2020-05-16: 15 [IU] via SUBCUTANEOUS
  Filled 2020-05-16 (×2): qty 0.15

## 2020-05-16 MED ORDER — TRAMADOL HCL 50 MG PO TABS
50.0000 mg | ORAL_TABLET | Freq: Four times a day (QID) | ORAL | Status: DC | PRN
  Administered 2020-05-16 – 2020-05-23 (×10): 50 mg via ORAL
  Filled 2020-05-16 (×10): qty 1

## 2020-05-16 MED ORDER — DOXERCALCIFEROL 4 MCG/2ML IV SOLN
INTRAVENOUS | Status: AC
  Filled 2020-05-16: qty 2

## 2020-05-16 NOTE — Progress Notes (Signed)
Ordered speciality bed for Pt due to obesity and sores.  Pt c/o pain to right knee this after with no relief from the tramadol.  Ordered her a K Pad that was applied to the knee area only, avoided the surgical area Pt is now resting comfortably in bed

## 2020-05-16 NOTE — Consult Note (Signed)
Consultation Note Date: 05/16/2020   Patient Name: Suzanne Levy  DOB: [birth year]  MRN: [id number]  Age / Sex: 69 y.o., female  PCP: [name], MD Referring Physician: Aline August, MD  Reason for Consultation: Establishing goals of care and Psychosocial/spiritual support  HPI/Patient Profile: 69 y.o. female  with past medical history of legally blind, ESRD on HD, history of PE on Eliquis, IDDM 2, HTN/HLD, asthma, morbid obesity/wheelchair-bound admitted on 05/13/2020 with ruptured hematoma of the right lower leg after being hit by the automatic door at dialysis on 10/1.   Clinical Assessment and Goals of Care: I have reviewed medical records including EPIC notes, labs and imaging, examined the patient and met at bedside with patient to discuss diagnosis prognosis, GOC, EOL wishes, disposition and options.  Ms. Pla is legally blind, but she will make and somewhat keep eye contact.  She appears morbidly obese, quite frail.  She is oriented to person and situation, able to make her basic needs known.  There is no family at bedside at this time.  I introduced Palliative Medicine as specialized medical care for people living with serious illness. It focuses on providing relief from the symptoms and stress of a serious illness.   As far as functional and nutritional status Ms. Francois tells me that she lives in her own home alone.  But shares that she does have an aide to help her for 3 hours/day.  I asked how she manages at other times with toileting and other activities, but she does not answer me.  I asked how she tolerated dialysis today and she tells me a, "okay".  Her lunch tray is in front of her and she is eating.  As I start talking with her she starts to tear up asking for me to "help with my leg".  Both legs are clearly swollen, and she asks if I can lower her feet.  Call to contact in the chart,  Tammy Crowder.  Tammy tells me that she is Ms. Spargo's caregiver.  She states that Ms. Anschutz had been living in [facility] but moved to [facility] June 1.  She states that she had a couple living with her and her ENT go home, but they were not providing the care that she needed.  Tammy states that she has been a private pay caregiver coming 3 hours in the morning and 3 hours in the evening since June 1.  She shares that this work is becoming too much for her and she is recently given her 2 weeks notice.  Tammy states that she has talked with Ms. Babich about placement in [facility].  She states that they could do hemodialysis and wound care there.  I share that it would not be surprising if Ms. Aird needed long-term care placement at this point, Tammy agrees.  She states that she has encouraged Ms. Oblinger to apply for Medicaid, when she would take Ms. Butsch to the bank, a recent receipt showed that she had around $20,000 in her bank  account.  Tammy tells me that Ms. 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  She never married and has no children.  She gives Mailyn's sister as [name] (last name unknown) who lives in [place] area, phone [phone].   Call to sister, Deri Fuelling at [phone].  We talked about Ms. Piscitelli's acute and chronic health concerns.  [name] seems very knowledgeable about Mailyn's health and declines.  She states that Jaiyah has "never been this bad before".  She shares that she is in essence bedbound, and unable to live independently any longer.  [name] states that they are trying to get her placed in [facility] for rehab and then anticipating long-term care.  She and Tammy will be at Ms. Williams bedside around noon tomorrow.    [name] verifies that Ms. Balon never married, has no children.  Their 2 sisters, [name] who lives locally in [place] who lives in [place].  She tells me that Ms. Tess managed  a mental health center in [place], [place].  She has a Scientist, water quality in psychology.  Advanced directives, concepts specific to code status, were considered and discussed with [name], shares they have not discussed this, but will discuss.  Palliative Care services outpatient were explained and offered, [name] agrees to out patient palliative.  Questions and concerns were addressed.  The family was encouraged to call with questions or concerns.   Conference with attending, bedside nursing staff, transition of care team related to patient condition, needs, goals of care.  HCPOA    NEXT OF KIN -Mrs. Kenedy tells me that her sisters are her healthcare surrogate.  Deri Fuelling, oldest sister is Donald Siva lives in [place]. Amaranta is baby of the family.   [name] tells me that they have been attempting to complete healthcare power of attorney paperwork, but have been unable.    SUMMARY OF RECOMMENDATIONS   At this point continue to treat the treatable Continue CODE STATUS discussions Seeking short-term rehab, anticipating long-term care  Code Status/Advance Care Planning:  Full code -[name] states that she is not sure what Michaella would want.  She shares that they have not had CODE STATUS/end-of-life discussions.  [name] states that she would not want attempted resuscitation for herself.  She tells me that she will have [place] discussions with Barbaraann Share tomorrow.  She is agreeable to outpatient palliative to continue discussions.  Symptom Management:   Per hospitalist, no additional needs at this time.  Palliative Prophylaxis:   Frequent Pain Assessment, Palliative Wound Care and Turn Reposition  Additional Recommendations (Limitations, Scope, Preferences):  Full Scope Treatment  Psycho-social/Spiritual:   Desire for further Chaplaincy support:no  Additional Recommendations: Caregiving  Support/Resources and Education on Hospice  Prognosis:   Unable to  determine, based on outcomes.  Discharge Planning: To be determined, based on outcomes      Primary Diagnoses: Present on Admission: . Traumatic hematoma of right lower leg . ESRD (end stage renal disease) ([place]) . Hyperkalemia . History of pulmonary embolus (PE) . Acute blood loss anemia . Hyperlipidemia associated with type 2 diabetes mellitus ([place]) . Bimalleolar ankle fracture, left, closed, initial encounter   I have reviewed the medical record, interviewed the patient and family, and examined the patient. The following aspects are pertinent.  Past Medical History:  Diagnosis Date  . Asthma   . Diabetes mellitus without complication ([place])   . Hypertension   . Legally blind   . Renal disorder  Social History   Socioeconomic History  . Marital status: Single    Spouse name: Not on file  . Number of children: Not on file  . Years of education: Not on file  . Highest education level: Not on file  Occupational History  . Not on file  Tobacco Use  . Smoking status: Never Smoker  . Smokeless tobacco: Never Used  Substance and Sexual Activity  . Alcohol use: No  . Drug use: No  . Sexual activity: Not on file  Other Topics Concern  . Not on file  Social History Narrative  . Not on file   Social Determinants of Health   Financial Resource Strain:   . Difficulty of Paying Living Expenses: Not on file  Food Insecurity:   . Worried About Charity fundraiser in the Last Year: Not on file  . Ran Out of Food in the Last Year: Not on file  Transportation Needs:   . Lack of Transportation (Medical): Not on file  . Lack of Transportation (Non-Medical): Not on file  Physical Activity:   . Days of Exercise per Week: Not on file  . Minutes of Exercise per Session: Not on file  Stress:   . Feeling of Stress : Not on file  Social Connections:   . Frequency of Communication with Friends and Family: Not on file  . Frequency of Social Gatherings with Friends and Family:  Not on file  . Attends Religious Services: Not on file  . Active Member of Clubs or Organizations: Not on file  . Attends Archivist Meetings: Not on file  . Marital Status: Not on file   Family History  Problem Relation Age of Onset  . Cancer Father    Scheduled Meds: . Chlorhexidine Gluconate Cloth  6 each Topical Q0600  . collagenase   Topical Daily  . doxercalciferol      . doxercalciferol  2 mcg Intravenous Q M,W,F-HD  . doxycycline  100 mg Oral BID  . insulin aspart  0-6 Units Subcutaneous Q4H  . insulin detemir  15 Units Subcutaneous Daily  . midodrine  10 mg Oral Q M,W,F-HD  . montelukast  10 mg Oral Daily  . pravastatin  40 mg Oral Daily  . sodium zirconium cyclosilicate  10 g Oral Daily   Continuous Infusions: PRN Meds:.acetaminophen **OR** acetaminophen, albuterol, hydrOXYzine, ondansetron **OR** ondansetron (ZOFRAN) IV, traMADol Medications Prior to Admission:  Prior to Admission medications   Medication Sig Start Date End Date Taking? Authorizing Provider  acetaminophen (TYLENOL) 500 MG tablet Take 1,000 mg by mouth 3 (three) times daily.   Yes [provider]  albuterol (PROVENTIL HFA;VENTOLIN HFA) 108 (90 BASE) MCG/ACT inhaler Inhale 2 puffs into the lungs every 4 (four) hours as needed for wheezing or shortness of breath.   Yes [provider]  ascorbic acid (VITAMIN C) 500 MG tablet Take 500 mg by mouth 2 (two) times daily.    Yes [provider]  B Complex Vitamins (RA B-COMPLEX) TABS Take 1 tablet by mouth daily. 04/23/20  Yes [provider]  doxycycline (VIBRAMYCIN) 100 MG capsule Take 100 mg by mouth 2 (two) times daily. 05/08/20  Yes [provider]  ELIQUIS 5 MG TABS tablet Take 5 mg by mouth 2 (two) times daily. 01/31/20  Yes [provider]  fluticasone (FLONASE) 50 MCG/ACT nasal spray Place 1 spray into both nostrils daily as needed for allergies.    Yes [provider]  folic acid  (  FOLVITE) 1 MG tablet Take 1 mg by mouth daily.   Yes [provider]  gabapentin (NEURONTIN) 100 MG capsule Take 200 mg by mouth 3 (three) times daily.    Yes [provider]  insulin aspart protamine- aspart (NOVOLOG MIX 70/30) (70-30) 100 UNIT/ML injection Inject 15 Units into the skin in the morning, at noon, and at bedtime.   Yes [provider]  insulin NPH Human (NOVOLIN N) 100 UNIT/ML injection Inject 20-36 Units into the skin 2 (two) times daily before a meal. Inject 36 units in the morning and 20 units at night.   Yes [provider]  lanthanum (FOSRENOL) 1000 MG chewable tablet Chew 1,000 mg by mouth 3 (three) times daily with meals.   Yes [provider]  linezolid (ZYVOX) 600 MG tablet Take 600 mg by mouth 2 (two) times daily.  04/21/20  Yes [provider]  melatonin 3 MG TABS tablet Take 3 mg by mouth at bedtime as needed (slee[p).   Yes [provider]  metoCLOPramide (REGLAN) 5 MG tablet Take 5 mg by mouth 4 (four) times daily. 04/23/20  Yes [provider]  midodrine (PROAMATINE) 10 MG tablet Take 1 tablet (10 mg total) by mouth 2 (two) times daily with a meal. Patient taking differently: Take 10 mg by mouth every Monday, Wednesday, and Friday. With dialysis 03/07/20  Yes [name], MD  montelukast (SINGULAIR) 10 MG tablet Take 10 mg by mouth daily.   Yes [provider]  multivitamin (RENA-VIT) TABS tablet Take 1 tablet by mouth daily.   Yes [provider]  pantoprazole (PROTONIX) 40 MG tablet Take 40 mg by mouth daily.   Yes [provider]  polyethylene glycol (MIRALAX / GLYCOLAX) packet Take 17 g by mouth 2 (two) times daily.   Yes [provider]  pravastatin (PRAVACHOL) 40 MG tablet Take 40 mg by mouth daily.   Yes [provider]  Skin Protectants, Misc. (EUCERIN) cream Apply 1 application topically as needed for dry skin.    Yes [provider]  traMADol (ULTRAM) 50 MG tablet Take 1-2 tablets (50-100 mg total) by mouth every 6 (six) hours. Patient taking differently: Take 50-100 mg by mouth 2 (two) times daily.  03/08/20  Yes [name], MD  VOLTAREN 1 % GEL Apply 2 g topically 2 (two) times daily as needed. 04/23/20  Yes [provider]  zinc sulfate 220 (50 Zn) MG capsule Take 220 mg by mouth daily. 04/23/20  Yes [provider]  doxercalciferol (HECTOROL) 4 MCG/2ML injection Inject 1 mL (2 mcg total) into the vein every Monday, Wednesday, and Friday with hemodialysis. 03/07/20   [name], MD   Allergies  Allergen Reactions  . Enoxaparin Other (See Comments)    unknown  . 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 Other Other (See Comments)    Uncoded Allergy. Allergen: BLOOD THINNERS, Other Reaction: eye hemorrhage, seafood, (can eat salmon), acid drinks like orange juice,grape fruitt juice  . Ace Inhibitors Other (See Comments)    ESRD   . Aspirin Other (See Comments)    Eye hemorrhage  . Buprenorphine Hcl Other (See Comments)    unknown  . Codeine Itching  . Duloxetine Other (See Comments)    unknown  . Gentamicin Other (See Comments)    unknown  . Heparin Other (See Comments)    "depletes something in  my system"  . Hydrocodone Itching  . Morphine And Related Other (See Comments)    unknown  . Oxycodone Itching  . Penicillins Other (See Comments)    unknown  . Vancomycin Other (See Comments)    unknown  . Cephalexin Rash  . Cephalosporins Rash  . Tomato Rash   Review of Systems  Unable to perform ROS: Acuity of condition    Physical Exam Vitals and nursing note reviewed.  Constitutional:      General: She is not in acute distress.    Appearance: She is obese. She is ill-appearing.  HENT:     Head: Normocephalic and atraumatic.     Mouth/Throat:     Mouth: Mucous membranes are  moist.  Cardiovascular:     Rate and Rhythm: Normal rate.  Pulmonary:     Effort: Pulmonary effort is normal.  Abdominal:     Comments: Obese abd. Wounds as noted by nursing   Musculoskeletal:        General: Swelling present.  Skin:    General: Skin is warm and dry.     Comments: Wounds as noted   Neurological:     Mental Status: She is alert.     Comments: Oriented to person   Psychiatric:     Comments: Focused on having me do small tasks for her.      Vital Signs: BP (!) 107/56 (BP Location: Right Arm)   Pulse 87   Temp 98 F (36.7 C) (Oral)   Resp 16   Ht _0  (1.803 m)   Wt (!) 158.3 kg   LMP  (LMP Unknown)   SpO2 96%   BMI 48.67 kg/m  Pain Scale: 0-10 POSS *See Group Information*: 1-Acceptable,Awake and alert Pain Score: 0-No pain   SpO2: SpO2: 96 % O2 Device:SpO2: 96 % O2 Flow Rate: .O2 Flow Rate (L/min): 1 L/min  IO: Intake/output summary:   Intake/Output Summary (Last 24 hours) at 05/16/2020 1522 Last data filed at 05/16/2020 1055 Gross per 24 hour  Intake 240 ml  Output 660 ml  Net -420 ml    LBM: Last BM Date: 05/14/20 Baseline Weight: Weight: (!) 158.1 kg Most recent weight: Weight:  (155)     Palliative Assessment/Data:   Flowsheet Rows     Most Recent Value  Intake Tab  Referral Department Hospitalist  Unit at Time of Referral Cardiac/Telemetry Unit  Palliative Care Primary Diagnosis Sepsis/Infectious Disease  Date Notified 05/15/20  Palliative Care Type New Palliative care  Reason for referral Clarify Goals of Care  Date of Admission 05/13/20  Date first seen by Palliative Care 05/16/20  # of days Palliative referral response time 1 Day(s)  # of days IP prior to Palliative referral 2  Clinical Assessment  Palliative Performance Scale Score 30%  Pain Max last 24 hours Not able to report  Pain Min Last 24 hours Not able to report  Dyspnea Max Last 24 Hours Not able to report  Dyspnea Min Last 24 hours Not able to report    Psychosocial & Spiritual Assessment  Palliative Care Outcomes      Time In: 1330 Time Out: 1440 Time Total: 70 minutes  Greater than 50%  of this time was spent counseling and coordinating care related to the above assessment and plan.  Signed by: Drue Novel, NP   Please contact Palliative Medicine Team phone at [phone] for questions and concerns.  For individual provider: See Shea Evans

## 2020-05-16 NOTE — Progress Notes (Signed)
Patient ID: Leeandra Ellerson, female   DOB: [birth year], 69 y.o.   MRN: [id number]  PROGRESS NOTE    Shawnette Augello  IRW:[id number] DOB: 1950/08/26 DOA: 05/13/2020 PCP: [name], MD   Brief Narrative:  69 year old female, morbidly obese, with past medical history significant for ESRD on MWF HD, history of PE on Eliquis, insulin-dependent type 2 diabetes, hypertension, hyperlipidemia, morbid obesity and asthma who is wheelchair-bound was admitted with ruptured hematoma of the right lower leg.  She underwent evacuation of the hematoma, cauterization of bleeding areas and closure of the wound by general surgery.  Nephrology is following for dialysis needs.  Assessment & Plan:   Ruptured hematoma of the right lower extremity -Occurred in the setting of anticoagulation use -Status post evacuation of the hematoma, cauterization of the bleeding vessels and closure of the large wound by general surgery -Currently on doxycycline. -Eliquis still on hold. -Wound care as per general surgery recommendations  Acute blood loss anemia -Due to above.  Status post 2 units packed red cells transfusion.  Hemoglobin 7.8 today.  End-stage renal disease on hemodialysis -Nephrology following.  Hemodialysis as per nephrology schedule and recommendations  Hyperkalemia -Currently on Lokelma.  Resolved.  Nephrology following.  History of PE/DVT on Eliquis -Eliquis on hold  Insulin-dependent diabetes mellitus type 2 uncontrolled with hyperglycemia -Blood sugars on the higher side.  Continue CBGs with SSI.  Increase Lantus to 15 units daily.  Leukocytosis -Improving.  Monitor  Thrombocytopenia -Questionable cause.  Monitor  Morbid obesity/OSA/OHS -Continue CPAP at bedtime.  Left ankle and pannus wounds -Recent admission at [facility] from 04/11/2020-04/21/2020 requiring debridement of right flank and groin wounds on 04/14/2020.  On oral antibiotics as an outpatient -Wound care team input is appreciated.  General  surgery following  Left foot fractures -X-ray of the left ankle is showing bimalleolar fracture.  Orthopedics recommended conservative management with CAM boot and office follow-up.  Generalized deconditioning -Overall prognosis is guarded to poor.  Palliative care consultation for goals of care discussion   DVT prophylaxis: SCDs Code Status: Full Family Communication: None at bedside Disposition Plan: Status is: Inpatient  Remains inpatient appropriate because:Inpatient level of care appropriate due to severity of illness.  Might need SNF placement.   Dispo: The patient is from: Home              Anticipated d/c is to: SNF              Anticipated d/c date is: 3 days              Patient currently is not medically stable to d/c.  Consultants: General surgery/nephrology.  Orthopedics, palliative care  Procedures: Evacuation of hematoma from right leg, cauterization of bleeding areas, closure of 20 cm of wounds on 05/14/2020 by general surgery  Antimicrobials:  Anti-infectives (From admission, onward)   Start     Dose/Rate Route Frequency Ordered Stop   05/14/20 2200  doxycycline (VIBRA-TABS) tablet 100 mg        100 mg Oral 2 times daily 05/14/20 1017 05/17/20 2359   05/14/20 1000  doxycycline (VIBRA-TABS) tablet 100 mg  Status:  Discontinued        100 mg Oral 2 times daily 05/13/20 2038 05/14/20 1017   05/14/20 1000  linezolid (ZYVOX) tablet 600 mg  Status:  Discontinued        600 mg Oral 2 times daily 05/13/20 2038 05/14/20 1017   05/13/20 1945  clindamycin (CLEOCIN) IVPB 900 mg  900 mg 100 mL/hr over 30 Minutes Intravenous On call to O.R. 05/13/20 1933 05/14/20 0040       Subjective: Patient seen and examined at bedside undergoing dialysis.  Extremely poor historian.  Wakes up slightly; does not participate in communicating much.  No worsening shortness breath, fever or vomiting reported. Objective: Vitals:   05/16/20 0030 05/16/20 0404 05/16/20 0500 05/16/20  0647  BP: (!) 76/29 (!) 74/41  (!) 104/91  Pulse: 94 87  86  Resp: 15 15  15   Temp:  97.6 F (36.4 C)    TempSrc:  Oral    SpO2: 98% 98%  98%  Weight:   (!) 158.3 kg   Height:        Intake/Output Summary (Last 24 hours) at 05/16/2020 0751 Last data filed at 05/15/2020 1802 Gross per 24 hour  Intake 1372.5 ml  Output --  Net 1372.5 ml   Filed Weights   05/14/20 1900 05/15/20 0342 05/16/20 0500  Weight: (!) 157 kg (!) 159.4 kg (!) 158.3 kg    Examination:  General exam: No acute distress.  Looks older than stated age.  Looks chronically ill.  Poor historian. Respiratory system: Bilateral decreased breath sounds at bases with no wheezing.  Some scattered crackles heard Cardiovascular system: Rate controlled, S1-S2 heard Gastrointestinal system: Abdomen is morbidly obese, slightly distended, soft and nontender.  Bowel sounds are heard Extremities: Right thigh has dressing.  No clubbing.  Trace lower extremity edema present Central nervous system: Wakes up slightly, does not engage in conversation much.  No focal neurological deficits. Moving extremities Skin: No other petechiae  psychiatry: Extremely flat affect  Data Reviewed: I have personally reviewed following labs and imaging studies  CBC: Recent Labs  Lab 05/13/20 1718 05/14/20 0036 05/14/20 0711 05/15/20 0039 05/16/20 0045  WBC 17.4*  --  16.8* 14.5* 12.4*  NEUTROABS  --   --   --  12.6* 10.2*  HGB 6.6* 7.5* 7.1* 7.7* 7.8*  HCT 22.7* 22.0* 23.6* 24.6* 25.7*  MCV 100.0  --  98.7 93.5 94.8  PLT 180  --  162 133* 130*   Basic Metabolic Panel: Recent Labs  Lab 05/13/20 1449 05/13/20 1449 05/13/20 1718 05/14/20 0036 05/14/20 0711 05/15/20 0039 05/16/20 0045  NA 131*   < > 132* 130* 131* 133* 129*  K 5.7*   < > 5.9* 5.8* 6.3* 4.8 4.1  CL 93*  --  88*  --  88* 92* 89*  CO2  --   --  23  --  20* 22 23  GLUCOSE 154*  --  177*  --  229* 198* 225*  BUN 40*  --  43*  --  52* 30* 45*  CREATININE 7.10*  --   7.27*  --  7.41* 4.55* 6.01*  CALCIUM  --   --  9.0  --  9.1 8.4* 8.8*  MG  --   --   --   --   --  2.0  --   PHOS  --   --   --   --   --  5.1*  --    < > = values in this interval not displayed.   GFR: Estimated Creatinine Clearance: 14.5 mL/min (A) (by C-G formula based on SCr of 6.01 mg/dL (H)). Liver Function Tests: Recent Labs  Lab 05/15/20 0039  ALBUMIN 2.3*   No results for input(s): LIPASE, AMYLASE in the last 168 hours. No results for input(s): AMMONIA in the last 168 hours.  Coagulation Profile: Recent Labs  Lab 05/13/20 1718  INR 1.7*   Cardiac Enzymes: No results for input(s): CKTOTAL, CKMB, CKMBINDEX, TROPONINI in the last 168 hours. BNP (last 3 results) No results for input(s): PROBNP in the last 8760 hours. HbA1C: No results for input(s): HGBA1C in the last 72 hours. CBG: Recent Labs  Lab 05/15/20 1216 05/15/20 1608 05/15/20 2018 05/16/20 0038 05/16/20 0430  GLUCAP 206* 225* 262* 219* 181*   Lipid Profile: No results for input(s): CHOL, HDL, LDLCALC, TRIG, CHOLHDL, LDLDIRECT in the last 72 hours. Thyroid Function Tests: No results for input(s): TSH, T4TOTAL, FREET4, T3FREE, THYROIDAB in the last 72 hours. Anemia Panel: No results for input(s): VITAMINB12, FOLATE, FERRITIN, TIBC, IRON, RETICCTPCT in the last 72 hours. Sepsis Labs: No results for input(s): PROCALCITON, LATICACIDVEN in the last 168 hours.  Recent Results (from the past 240 hour(s))  Respiratory Panel by RT PCR (Flu A&B, Covid) - Nasopharyngeal Swab     Status: None   Collection Time: 05/13/20  5:07 PM   Specimen: Nasopharyngeal Swab  Result Value Ref Range Status   SARS Coronavirus 2 by RT PCR NEGATIVE NEGATIVE Final    Comment: (NOTE) SARS-CoV-2 target nucleic acids are NOT DETECTED.  The SARS-CoV-2 RNA is generally detectable in upper respiratoy specimens during the acute phase of infection. The lowest concentration of SARS-CoV-2 viral copies this assay can detect is 131  copies/mL. A negative result does not preclude SARS-Cov-2 infection and should not be used as the sole basis for treatment or other patient management decisions. A negative result may occur with  improper specimen collection/handling, submission of specimen other than nasopharyngeal swab, presence of viral mutation(s) within the areas targeted by this assay, and inadequate number of viral copies (<131 copies/mL). A negative result must be combined with clinical observations, patient history, and epidemiological information. The expected result is Negative.  Fact Sheet for Patients:  PinkCheek.be  Fact Sheet for Healthcare Providers:  GravelBags.it  This test is no t yet approved or cleared by the [place] FDA and  has been authorized for detection and/or diagnosis of SARS-CoV-2 by FDA under an Emergency Use Authorization (EUA). This EUA will remain  in effect (meaning this test can be used) for the duration of the COVID-19 declaration under Section 564(b)(1) of the Act, 21 U.S.C. section 360bbb-3(b)(1), unless the authorization is terminated or revoked sooner.     Influenza A by PCR NEGATIVE NEGATIVE Final   Influenza B by PCR NEGATIVE NEGATIVE Final    Comment: (NOTE) The Xpert Xpress SARS-CoV-2/FLU/RSV assay is intended as an aid in  the diagnosis of influenza from Nasopharyngeal swab specimens and  should not be used as a sole basis for treatment. Nasal washings and  aspirates are unacceptable for Xpert Xpress SARS-CoV-2/FLU/RSV  testing.  Fact Sheet for Patients: PinkCheek.be  Fact Sheet for Healthcare Providers: GravelBags.it  This test is not yet approved or cleared by the [place] FDA and  has been authorized for detection and/or diagnosis of SARS-CoV-2 by  FDA under an Emergency Use Authorization (EUA). This EUA will remain  in effect (meaning  this test can be used) for the duration of the  Covid-19 declaration under Section 564(b)(1) of the Act, 21  U.S.C. section 360bbb-3(b)(1), unless the authorization is  terminated or revoked. Performed at [facility], [place] [address], [place], [place] [address]          Radiology Studies: DG Ankle Complete Left  Result Date: 05/14/2020 CLINICAL DATA:  Osteomyelitis. EXAM: LEFT ANKLE COMPLETE - 3+ VIEW COMPARISON:  None. FINDINGS: Evaluation is significantly limited by patient positioning. There is severe osteopenia which limits detection of nondisplaced fractures. There are age-indeterminate fractures of the medial and lateral malleoli. There is an impacted fracture of the distal tibia. There is extensive surrounding soft tissue swelling. There is lucency about the medial malleolar fracture of unknown clinical significance. Extensive vascular calcifications are noted. There are areas of sclerosis involving the calcaneus which may represent heterogeneous osteopenia versus an insufficiency fracture. There is a large ankle joint effusion. There is a questionable nondisplaced fracture through the base of the fifth metatarsal. IMPRESSION: 1. Examination is severely limited by patient positioning and diffuse osteopenia. 2. There are age-indeterminate displaced fractures of the lateral and medial malleoli in addition to a probable impacted fracture of the tibial plafond. Further evaluation with cross-sectional imaging would be useful for further evaluation given the presence of osteopenia. 3. There is extensive soft tissue swelling about the ankle with a probable large joint effusion. 4. There is a lucency at the level of the medial malleolar fracture. This is of unknown clinical significance. Follow-up with cross-sectional imaging is recommended. 5. Advanced vascular calcifications are noted. 6. Questionable nondisplaced fracture through the base of the fifth metatarsal. Electronically Signed   By:  Constance Holster M.D.   On: 05/14/2020 21:09   US Abdomen Limited  Result Date: 05/15/2020 CLINICAL DATA:  Wound left lateral abdominal wall EXAM: ULTRASOUND LEFT LATERAL ABDOMINAL WALL COMPARISON:  None. FINDINGS: Longitudinal and transverse images were obtained in the left lateral abdominal wall soft tissue region. Ultrasound of this area shows soft tissue induration without mass or fluid collection. No abscess evident by ultrasound in this region. IMPRESSION: Soft tissue induration in area lateral left abdominal wall without well-defined mass or abscess. No abnormal fluid in this area. If further evaluation of this area is felt to be warranted, CT through this area, ideally with intravenous contrast, could be helpful to further evaluate. Electronically Signed   By: Lowella Grip III M.D.   On: 05/15/2020 11:52        Scheduled Meds: . Chlorhexidine Gluconate Cloth  6 each Topical Q0600  . collagenase   Topical Daily  . insulin aspart  10 Units Intravenous Once   Followed by  . dextrose  50 mL Intravenous Once  . doxercalciferol  2 mcg Intravenous Q M,W,F-HD  . doxycycline  100 mg Oral BID  . insulin aspart  0-6 Units Subcutaneous Q4H  . insulin detemir  10 Units Subcutaneous Daily  . midodrine  10 mg Oral Q M,W,F-HD  . montelukast  10 mg Oral Daily  . pravastatin  40 mg Oral Daily  . sodium zirconium cyclosilicate  10 g Oral Daily   Continuous Infusions:        Aline August, MD [facility] 05/16/2020, 7:51 AM

## 2020-05-16 NOTE — Progress Notes (Addendum)
[facility] Progress Note  Subjective:  Seen in dialysis unit this am. Low Bps on monitor,but patient is alert not-symptomatic   Vitals:   05/16/20 1015 05/16/20 1030 05/16/20 1045 05/16/20 1055  BP: (!) 113/39 (!) 86/65 (!) 109/39 (!) 107/56  Pulse: 87 88 87 87  Resp: 15 15 18 16   Temp:    98 F (36.7 C)  TempSrc:    Oral  SpO2:      Weight:      Height:        Exam: General: Obese female, nad  Lungs: Clear, anteriorly, diminished breath sounds.  Breathing is unlabored. Heart: RRR with S1 S2 Abdomen: soft obese, non-tender  Lower extremities: R leg in bandage; pitting  LE edema  Neuro: A & O  X 3. Moves all extremities spontaneously. Dialysis Access: LUE AVG +bruit; some bruising to LUE     OP HD:  AF MWF 4h 40min 500/800 EDW 156kg 2K/2.25Ca AVG No heparin  Hectorol 2  Venofer 50 q week Mircera 225 (last 9/27)   Assessment/Plan: 1. Traumatic hematoma of RLE - s/p evacuation 10/4 2. ESRD -  HD MWF. HD Wed.  No heparin  3. Hyperkalemia - resolved.  4. Volume- Chronic hypotension on midodrine. Not to EDW by weights here 2kgs up.  +RLE edema 2+,  Mild other edema. UF as tolerated w HD.  5. Anemia  - Post op blood loss noted. S/p 2 u prbcs 10/3.  Recent ESA dose as outpatient. Hb 7's.  6. Metabolic bone disease -  Ca/phos ok.  Continue binders/Hectorol.  7. Nutrition - Renal diet/vitalmins 8. Hx PE - Holding Eliquis  9. H/o blindness 10. IDDM - per primary 11. Morbid obesity  12. Debility / WC dependent  Suzanne Child PA-C [facility] 05/16/2020,12:50 PM  Pt seen, examined and agree w A/P as above.  Rob Hilde Churchman  MD 05/16/2020, 1:51 PM     Recent Labs  Lab 05/15/20 0039 05/16/20 0045  K 4.8 4.1  BUN 30* 45*  CREATININE 4.55* 6.01*  CALCIUM 8.4* 8.8*  PHOS 5.1*  --   HGB 7.7* 7.8*   Inpatient medications:  Chlorhexidine Gluconate Cloth  6 each Topical Q0600   collagenase   Topical Daily   doxercalciferol        doxercalciferol  2 mcg Intravenous Q M,W,F-HD   doxycycline  100 mg Oral BID   insulin aspart  0-6 Units Subcutaneous Q4H   insulin detemir  15 Units Subcutaneous Daily   midodrine  10 mg Oral Q M,W,F-HD   montelukast  10 mg Oral Daily   pravastatin  40 mg Oral Daily   sodium zirconium cyclosilicate  10 g Oral Daily    acetaminophen **OR** acetaminophen, albuterol, HYDROmorphone (DILAUDID) injection, hydrOXYzine, ondansetron **OR** ondansetron (ZOFRAN) IV

## 2020-05-16 NOTE — Progress Notes (Signed)
3 Days Post-Op   Subjective/Chief Complaint: In HD. Reports leg hurts.   Objective: Vital signs in last 24 hours: Temp:  [97.4 F (36.3 C)-97.9 F (36.6 C)] 97.8 F (36.6 C) (10/06 0710) Pulse Rate:  [77-102] 89 (10/06 0830) Resp:  [13-18] 13 (10/06 0830) BP: (71-134)/(25-91) 88/29 (10/06 0830) SpO2:  [94 %-100 %] 96 % (10/06 0710) Weight:  [158.3 kg] 158.3 kg (10/06 0500) Last BM Date: 05/14/20  Intake/Output from previous day: 10/05 0701 - 10/06 0700 In: 1372.5 [P.O.:600; Blood:772.5] Out: -  Intake/Output this shift: No intake/output data recorded.  Extremities: R calf wound now with large blister on skin draining some clear fluid, evolving ecchymoses  Lab Results:  Recent Labs    05/15/20 0039 05/16/20 0045  WBC 14.5* 12.4*  HGB 7.7* 7.8*  HCT 24.6* 25.7*  PLT 133* 121*   BMET Recent Labs    05/15/20 0039 05/16/20 0045  NA 133* 129*  K 4.8 4.1  CL 92* 89*  CO2 22 23  GLUCOSE 198* 225*  BUN 30* 45*  CREATININE 4.55* 6.01*  CALCIUM 8.4* 8.8*   PT/INR Recent Labs    05/13/20 1718  LABPROT 18.9*  INR 1.7*   ABG Recent Labs    05/14/20 0036  PHART 7.388  HCO3 24.4    Studies/Results: DG Ankle Complete Left  Result Date: 05/14/2020 CLINICAL DATA:  Osteomyelitis. EXAM: LEFT ANKLE COMPLETE - 3+ VIEW COMPARISON:  None. FINDINGS: Evaluation is significantly limited by patient positioning. There is severe osteopenia which limits detection of nondisplaced fractures. There are age-indeterminate fractures of the medial and lateral malleoli. There is an impacted fracture of the distal tibia. There is extensive surrounding soft tissue swelling. There is lucency about the medial malleolar fracture of unknown clinical significance. Extensive vascular calcifications are noted. There are areas of sclerosis involving the calcaneus which may represent heterogeneous osteopenia versus an insufficiency fracture. There is a large ankle joint effusion. There is a  questionable nondisplaced fracture through the base of the fifth metatarsal. IMPRESSION: 1. Examination is severely limited by patient positioning and diffuse osteopenia. 2. There are age-indeterminate displaced fractures of the lateral and medial malleoli in addition to a probable impacted fracture of the tibial plafond. Further evaluation with cross-sectional imaging would be useful for further evaluation given the presence of osteopenia. 3. There is extensive soft tissue swelling about the ankle with a probable large joint effusion. 4. There is a lucency at the level of the medial malleolar fracture. This is of unknown clinical significance. Follow-up with cross-sectional imaging is recommended. 5. Advanced vascular calcifications are noted. 6. Questionable nondisplaced fracture through the base of the fifth metatarsal. Electronically Signed   By: Constance Holster M.D.   On: 05/14/2020 21:09   US Abdomen Limited  Result Date: 05/15/2020 CLINICAL DATA:  Wound left lateral abdominal wall EXAM: ULTRASOUND LEFT LATERAL ABDOMINAL WALL COMPARISON:  None. FINDINGS: Longitudinal and transverse images were obtained in the left lateral abdominal wall soft tissue region. Ultrasound of this area shows soft tissue induration without mass or fluid collection. No abscess evident by ultrasound in this region. IMPRESSION: Soft tissue induration in area lateral left abdominal wall without well-defined mass or abscess. No abnormal fluid in this area. If further evaluation of this area is felt to be warranted, CT through this area, ideally with intravenous contrast, could be helpful to further evaluate. Electronically Signed   By: Lowella Grip III M.D.   On: 05/15/2020 11:52    Anti-infectives: Anti-infectives (From admission,  onward)   Start     Dose/Rate Route Frequency Ordered Stop   05/14/20 2200  doxycycline (VIBRA-TABS) tablet 100 mg        100 mg Oral 2 times daily 05/14/20 1017 05/17/20 2359   05/14/20  1000  doxycycline (VIBRA-TABS) tablet 100 mg  Status:  Discontinued        100 mg Oral 2 times daily 05/13/20 2038 05/14/20 1017   05/14/20 1000  linezolid (ZYVOX) tablet 600 mg  Status:  Discontinued        600 mg Oral 2 times daily 05/13/20 2038 05/14/20 1017   05/13/20 1945  clindamycin (CLEOCIN) IVPB 900 mg        900 mg 100 mL/hr over 30 Minutes Intravenous On call to O.R. 05/13/20 1933 05/14/20 0040      Assessment/Plan: ESRD Hypotension HLD Asthma DM-2 Morbid obesity BMI 49 H/o PE/DVT on eliquis ABL anemia - Hgb 7.7 from 7.1  Pannus wound - noted on left lateral side. U/S showed no abscess. No surgery needed.  Ruptured hematoma -S/p evacuation of hematoma from right leg, cauterization of bleeding areas, closure of 20 cm of wounds 10/4 Dr. Kieth Brightly - Wound now with some blistering. Part of the skin flap may not survive. Continue local care and we will follow.  ID - doxycycline PO FEN - CM diet VTE - SCD Foley - none  LOS: 3 days    Suzanne Levy 05/16/2020

## 2020-05-16 NOTE — Plan of Care (Signed)

## 2020-05-17 DIAGNOSIS — S8011XA Contusion of right lower leg, initial encounter: Secondary | ICD-10-CM | POA: Diagnosis not present

## 2020-05-17 DIAGNOSIS — S82842A Displaced bimalleolar fracture of left lower leg, initial encounter for closed fracture: Secondary | ICD-10-CM | POA: Diagnosis not present

## 2020-05-17 DIAGNOSIS — Z515 Encounter for palliative care: Secondary | ICD-10-CM | POA: Diagnosis not present

## 2020-05-17 DIAGNOSIS — N186 End stage renal disease: Secondary | ICD-10-CM | POA: Diagnosis not present

## 2020-05-17 DIAGNOSIS — D62 Acute posthemorrhagic anemia: Secondary | ICD-10-CM | POA: Diagnosis not present

## 2020-05-17 DIAGNOSIS — Z7189 Other specified counseling: Secondary | ICD-10-CM | POA: Diagnosis not present

## 2020-05-17 LAB — CBC WITH DIFFERENTIAL/PLATELET
Abs Immature Granulocytes: 0.18 10*3/uL — ABNORMAL HIGH (ref 0.00–0.07)
Basophils Absolute: 0 10*3/uL (ref 0.0–0.1)
Basophils Relative: 0 %
Eosinophils Absolute: 0.1 10*3/uL (ref 0.0–0.5)
Eosinophils Relative: 1 %
HCT: 24.2 % — ABNORMAL LOW (ref 36.0–46.0)
Hemoglobin: 7.3 g/dL — ABNORMAL LOW (ref 12.0–15.0)
Immature Granulocytes: 1 %
Lymphocytes Relative: 5 %
Lymphs Abs: 0.7 10*3/uL (ref 0.7–4.0)
MCH: 29.1 pg (ref 26.0–34.0)
MCHC: 30.2 g/dL (ref 30.0–36.0)
MCV: 96.4 fL (ref 80.0–100.0)
Monocytes Absolute: 1.1 10*3/uL — ABNORMAL HIGH (ref 0.1–1.0)
Monocytes Relative: 8 %
Neutro Abs: 10.8 10*3/uL — ABNORMAL HIGH (ref 1.7–7.7)
Neutrophils Relative %: 85 %
Platelets: 121 10*3/uL — ABNORMAL LOW (ref 150–400)
RBC: 2.51 MIL/uL — ABNORMAL LOW (ref 3.87–5.11)
RDW: 22.9 % — ABNORMAL HIGH (ref 11.5–15.5)
WBC: 12.8 10*3/uL — ABNORMAL HIGH (ref 4.0–10.5)
nRBC: 0.6 % — ABNORMAL HIGH (ref 0.0–0.2)

## 2020-05-17 LAB — GLUCOSE, CAPILLARY
Glucose-Capillary: 231 mg/dL — ABNORMAL HIGH (ref 70–99)
Glucose-Capillary: 203 mg/dL — ABNORMAL HIGH (ref 70–99)
Glucose-Capillary: 238 mg/dL — ABNORMAL HIGH (ref 70–99)
Glucose-Capillary: 195 mg/dL — ABNORMAL HIGH (ref 70–99)
Glucose-Capillary: 235 mg/dL — ABNORMAL HIGH (ref 70–99)
Glucose-Capillary: 197 mg/dL — ABNORMAL HIGH (ref 70–99)

## 2020-05-17 LAB — BASIC METABOLIC PANEL
Anion gap: 14 (ref 5–15)
BUN: 31 mg/dL — ABNORMAL HIGH (ref 8–23)
CO2: 25 mmol/L (ref 22–32)
Calcium: 8.7 mg/dL — ABNORMAL LOW (ref 8.9–10.3)
Chloride: 93 mmol/L — ABNORMAL LOW (ref 98–111)
Creatinine, Ser: 4.43 mg/dL — ABNORMAL HIGH (ref 0.44–1.00)
GFR calc non Af Amer: 9 mL/min — ABNORMAL LOW (ref >60)
Glucose, Bld: 237 mg/dL — ABNORMAL HIGH (ref 70–99)
Potassium: 3.4 mmol/L — ABNORMAL LOW (ref 3.5–5.1)
Sodium: 132 mmol/L — ABNORMAL LOW (ref 135–145)

## 2020-05-17 MED ORDER — MIDODRINE HCL 5 MG PO TABS
10.0000 mg | ORAL_TABLET | Freq: Three times a day (TID) | ORAL | Status: DC
  Administered 2020-05-17 – 2020-05-24 (×21): 10 mg via ORAL
  Filled 2020-05-17 (×22): qty 2

## 2020-05-17 MED ORDER — LACTATED RINGERS IV BOLUS
250.0000 mL | Freq: Once | INTRAVENOUS | Status: AC
  Administered 2020-05-17: 250 mL via INTRAVENOUS

## 2020-05-17 MED ORDER — OXYCODONE HCL 5 MG PO TABS
5.0000 mg | ORAL_TABLET | Freq: Four times a day (QID) | ORAL | Status: DC | PRN
  Administered 2020-05-17 – 2020-05-23 (×11): 5 mg via ORAL
  Filled 2020-05-17 (×13): qty 1

## 2020-05-17 MED ORDER — LACTATED RINGERS IV BOLUS: 500.0000 mL | Freq: Once | INTRAVENOUS | Status: DC

## 2020-05-17 MED ORDER — INSULIN DETEMIR 100 UNIT/ML ~~LOC~~ SOLN
20.0000 [IU] | Freq: Every day | SUBCUTANEOUS | Status: DC
  Administered 2020-05-17 – 2020-05-23 (×7): 20 [IU] via SUBCUTANEOUS
  Filled 2020-05-17 (×8): qty 0.2

## 2020-05-17 NOTE — Progress Notes (Signed)
4 Days Post-Op   Subjective/Chief Complaint: No complaints   Objective: Vital signs in last 24 hours: Temp:  [97.7 F (36.5 C)-98.8 F (37.1 C)] 98.2 F (36.8 C) (10/07 0719) Pulse Rate:  [76-90] 90 (10/07 0719) Resp:  [11-19] 18 (10/07 0719) BP: (71-149)/(21-90) 97/37 (10/07 0719) SpO2:  [91 %-100 %] 100 % (10/07 0719) Last BM Date: 05/14/20  Intake/Output from previous day: 10/06 0701 - 10/07 0700 In: 253.9 [IV Piggyback:253.9] Out: 660  Intake/Output this shift: No intake/output data recorded.  Extremities: R calf wound with blistering and evolving ecchymoses, clear drainage from blister. Re-dressed  Lab Results:  Recent Labs    05/16/20 0045 05/17/20 0034  WBC 12.4* 12.8*  HGB 7.8* 7.3*  HCT 25.7* 24.2*  PLT 121* 121*   BMET Recent Labs    05/16/20 0045 05/17/20 0034  NA 129* 132*  K 4.1 3.4*  CL 89* 93*  CO2 23 25  GLUCOSE 225* 237*  BUN 45* 31*  CREATININE 6.01* 4.43*  CALCIUM 8.8* 8.7*   PT/INR No results for input(s): LABPROT, INR in the last 72 hours. ABG No results for input(s): PHART, HCO3 in the last 72 hours.  Invalid input(s): PCO2, PO2  Studies/Results: US Abdomen Limited  Result Date: 05/15/2020 CLINICAL DATA:  Wound left lateral abdominal wall EXAM: ULTRASOUND LEFT LATERAL ABDOMINAL WALL COMPARISON:  None. FINDINGS: Longitudinal and transverse images were obtained in the left lateral abdominal wall soft tissue region. Ultrasound of this area shows soft tissue induration without mass or fluid collection. No abscess evident by ultrasound in this region. IMPRESSION: Soft tissue induration in area lateral left abdominal wall without well-defined mass or abscess. No abnormal fluid in this area. If further evaluation of this area is felt to be warranted, CT through this area, ideally with intravenous contrast, could be helpful to further evaluate. Electronically Signed   By: Lowella Grip III M.D.   On: 05/15/2020 11:52     Anti-infectives: Anti-infectives (From admission, onward)   Start     Dose/Rate Route Frequency Ordered Stop   05/14/20 2200  doxycycline (VIBRA-TABS) tablet 100 mg        100 mg Oral 2 times daily 05/14/20 1017 05/17/20 2359   05/14/20 1000  doxycycline (VIBRA-TABS) tablet 100 mg  Status:  Discontinued        100 mg Oral 2 times daily 05/13/20 2038 05/14/20 1017   05/14/20 1000  linezolid (ZYVOX) tablet 600 mg  Status:  Discontinued        600 mg Oral 2 times daily 05/13/20 2038 05/14/20 1017   05/13/20 1945  clindamycin (CLEOCIN) IVPB 900 mg        900 mg 100 mL/hr over 30 Minutes Intravenous On call to O.R. 05/13/20 1933 05/14/20 0040      Assessment/Plan: ESRD Hypotension HLD Asthma DM-2 Morbid obesity BMI 70 H/o PE/DVT on eliquis ABL anemia - Hgb 7.3  Ruptured hematoma -S/p evacuation of hematoma from right leg, cauterization of bleeding areas, closure of 20 cm of wounds 10/4 Dr. Kieth Brightly - Wound now with blistering. Part of the skin flap may not survive. Continue local care and we will follow. Likely will benefit from being followed at the wound care center after D/C if feasible.  ID - doxycycline PO ends today FEN - CM diet VTE - SCD Foley - none  LOS: 4 days    Zenovia Jarred 05/17/2020

## 2020-05-17 NOTE — Progress Notes (Signed)
Pt refusing cpap for the night. RT will continue to monitor as needed. 

## 2020-05-17 NOTE — Care Management Important Message (Signed)
Important Message  Patient Details  Name: Suzanne Levy MRN: [id number] Date of Birth: [birth year]   Medicare Important Message Given:  Yes  Signed IM mailed to the patient home address.     Tapanga Ottaway 05/17/2020, 12:45 PM

## 2020-05-17 NOTE — Progress Notes (Signed)
[facility] Progress Note  Subjective: seen in room today, lying flat, no sOb or cough  Vitals:   05/16/20 2340 05/17/20 0350 05/17/20 0719 05/17/20 1127  BP: (!) 90/54 (!) 79/62 (!) 97/37 112/80  Pulse: 83 76 90 92  Resp: 19 17 18 15   Temp: 98.8 F (37.1 C) 98.5 F (36.9 C) 98.2 F (36.8 C) (!) 97.4 F (36.3 C)  TempSrc: Oral Oral Oral Axillary  SpO2: 91% 92% 100% 99%  Weight:      Height:        Exam: General: Obese female, nad  Lungs: Clear, anteriorly, diminished breath sounds.  Breathing is unlabored. Heart: RRR with S1 S2 Abdomen: soft obese, non-tender  Lower extremities: R leg in bandage; 1+ pitting hip/ leg edema bilat Neuro: A & O  X 3. Moves all extremities spontaneously. Dialysis Access: LUE AVG +bruit; some bruising to LUE     OP HD:  AF MWF 4h 58min 500/800   156kg  2K/2.25Ca   L AVG   Heparin none Hectorol 2  Venofer 50 q week Mircera 225 (last 9/27)   Assessment/Plan: 1. Traumatic hematoma of RLE - s/p evacuation 10/4 2. ESRD -  HD MWF. HD Friday, labs are good.  No heparin  3. Volume- Chronic hypotension on midodrine. 2kg over by wts, mild leg edema, UF as tolerated w HD.  4. Hypokalemia - dc'd lokelma, supplement prn 5. Anemia  - Post op blood loss noted. S/p 2 u prbcs 10/3.  Recent ESA dose as outpatient. Hb 7- 8 range here.  6. Metabolic bone disease -  Ca/phos ok.  Continue binders/Hectorol.  7. Nutrition - Renal diet/vitalmins 8. Hx PE - Holding Eliquis  9. H/o blindness 10. IDDM - per primary 11. Morbid obesity  12. Debility / WC dependent   Rob Lenora Gomes  MD 05/17/2020, 2:25 PM     Recent Labs  Lab 05/15/20 0039 05/15/20 0039 05/16/20 0045 05/17/20 0034  K 4.8   < > 4.1 3.4*  BUN 30*   < > 45* 31*  CREATININE 4.55*   < > 6.01* 4.43*  CALCIUM 8.4*   < > 8.8* 8.7*  PHOS 5.1*  --   --   --   HGB 7.7*   < > 7.8* 7.3*   < > = values in this interval not displayed.   Inpatient medications: . Chlorhexidine  Gluconate Cloth  6 each Topical Q0600  . collagenase   Topical Daily  . doxercalciferol  2 mcg Intravenous Q M,W,F-HD  . doxycycline  100 mg Oral BID  . insulin aspart  0-6 Units Subcutaneous Q4H  . insulin detemir  20 Units Subcutaneous Daily  . midodrine  10 mg Oral TID WC  . montelukast  10 mg Oral Daily  . pravastatin  40 mg Oral Daily  . sodium zirconium cyclosilicate  10 g Oral Daily    acetaminophen **OR** acetaminophen, albuterol, hydrOXYzine, ondansetron **OR** ondansetron (ZOFRAN) IV, oxyCODONE, traMADol

## 2020-05-17 NOTE — Progress Notes (Signed)
Patient's blood pressure low throughout the night. Paged Dr. Cyd Silence @around  midnight for patient's systolic being in the 85T. Stat CBC ordered, 250 cc LR bolus order received too. MD at bedside. MD assessed the patient. Patient arousable and asymptomatic. Patient's BP after the bolus still the same. MD made aware. Monitoring the patient for any changes.

## 2020-05-17 NOTE — Progress Notes (Signed)
Pt stated she did not want to go on her CPAP tonight I asked why and she just said no. I told the nurse to call if she decided to go on it.

## 2020-05-17 NOTE — Progress Notes (Signed)
Patient ID: Suzanne Levy, female   DOB: 04-Feb-1951, 69 y.o.   MRN: [id number]  PROGRESS NOTE    Suzanne Levy  VQM:[id number] DOB: 1950-09-11 DOA: 05/13/2020 PCP: [name], MD   Brief Narrative:  69 year old female, morbidly obese, with past medical history significant for ESRD on MWF HD, history of PE on Eliquis, insulin-dependent type 2 diabetes, hypertension, hyperlipidemia, morbid obesity and asthma who is wheelchair-bound was admitted with ruptured hematoma of the right lower leg.  She underwent evacuation of the hematoma, cauterization of bleeding areas and closure of the wound by general surgery.  Nephrology is following for dialysis needs.  Assessment & Plan:   Ruptured hematoma of the right lower extremity -Occurred in the setting of anticoagulation use -Status post evacuation of the hematoma, cauterization of the bleeding vessels and closure of the large wound by general surgery -Currently on doxycycline. -Eliquis still on hold. -Wound care as per general surgery recommendations  Acute blood loss anemia -Due to above.  Status post 2 units packed red cells transfusion.  Hemoglobin 7.3 today.  End-stage renal disease on hemodialysis -Nephrology following.  Hemodialysis as per nephrology schedule and recommendations  Chronic hypotension -Patient is on midodrine for chronic hypotension.  Overnight the blood pressure was consistently in the 70s and patient received 250 cc of IV fluid bolus.  Patient is currently asymptomatic.  Continue midodrine, but will switch to daily doses.  Hyperkalemia -Currently on Lokelma.  Resolved.  Nephrology following.  History of PE/DVT on Eliquis -Eliquis on hold  Insulin-dependent diabetes mellitus type 2 uncontrolled with hyperglycemia -Blood sugars on the higher side.  Continue CBGs with SSI.  Increase Lantus to 20 units daily.  Leukocytosis - Monitor  Thrombocytopenia -Questionable cause.  Monitor  Morbid  obesity/OSA/OHS -Continue CPAP at bedtime.  Left ankle and pannus wounds -Recent admission at [facility] from 04/11/2020-04/21/2020 requiring debridement of right flank and groin wounds on 04/14/2020.  On oral antibiotics as an outpatient -Wound care team input is appreciated.  General surgery following  Left foot fractures -X-ray of the left ankle is showing bimalleolar fracture.  Orthopedics recommended conservative management with CAM boot and office follow-up.  Generalized deconditioning -Overall prognosis is guarded to poor.  Palliative care following.  Currently remains full code.  DVT prophylaxis: SCDs Code Status: Full Family Communication: None at bedside Disposition Plan: Status is: Inpatient  Remains inpatient appropriate because:Inpatient level of care appropriate due to severity of illness.  Might need SNF placement.   Dispo: The patient is from: Home              Anticipated d/c is to: SNF              Anticipated d/c date is: 3 days              Patient currently is not medically stable to d/c.  Consultants: General surgery/nephrology.  Orthopedics, palliative care  Procedures: Evacuation of hematoma from right leg, cauterization of bleeding areas, closure of 20 cm of wounds on 05/14/2020 by general surgery  Antimicrobials:  Anti-infectives (From admission, onward)   Start     Dose/Rate Route Frequency Ordered Stop   05/14/20 2200  doxycycline (VIBRA-TABS) tablet 100 mg        100 mg Oral 2 times daily 05/14/20 1017 05/17/20 2359   05/14/20 1000  doxycycline (VIBRA-TABS) tablet 100 mg  Status:  Discontinued        100 mg Oral 2 times daily 05/13/20 2038 05/14/20 1017   05/14/20 1000  linezolid (ZYVOX) tablet 600 mg  Status:  Discontinued        600 mg Oral 2 times daily 05/13/20 2038 05/14/20 1017   05/13/20 1945  clindamycin (CLEOCIN) IVPB 900 mg        900 mg 100 mL/hr over 30 Minutes Intravenous On call to O.R. 05/13/20 1933 05/14/20 0040       Subjective: Patient  seen and examined at bedside.  Does not participate in conversation much.  Very poor historian.  No overnight fever or vomiting reported.  Nursing staff report episodes of hypotension overnight.  Complains of lower extremity pain.  Wants stronger pain medications. Objective: Vitals:   05/16/20 1600 05/16/20 2030 05/16/20 2340 05/17/20 0350  BP: (!) 149/42 (!) 91/38 (!) 90/54 (!) 79/62  Pulse: 80 79 83 76  Resp: 11 14 19 17   Temp: 97.7 F (36.5 C) 98.4 F (36.9 C) 98.8 F (37.1 C) 98.5 F (36.9 C)  TempSrc: Oral Oral Oral Oral  SpO2: 100% 97% 91% 92%  Weight:      Height:        Intake/Output Summary (Last 24 hours) at 05/17/2020 0724 Last data filed at 05/17/2020 0350 Gross per 24 hour  Intake 253.93 ml  Output 660 ml  Net -406.07 ml   Filed Weights   05/15/20 0342 05/16/20 0500  Weight: (!) 159.4 kg (!) 158.3 kg    Examination:  General exam: No distress.  Chronically ill looking.  Looks older than stated age.  Poor historian. Respiratory system: Bilateral decreased breath sounds at bases with scattered crackles.   Cardiovascular system: S1-S2 heard, rate controlled Gastrointestinal system: Abdomen is morbidly obese, distended, soft and nontender.  Normal bowel sounds heard Extremities: Dressing over the right thigh present.  No clubbing.  Bilateral lower extremity edema present.   Central nervous system: Does not participate in conversation much.  No focal neurological deficits. Moving extremities Skin: No other ecchymosis  psychiatry: Affect is very flat  Data Reviewed: I have personally reviewed following labs and imaging studies  CBC: Recent Labs  Lab 05/13/20 1718 05/13/20 1718 05/14/20 0036 05/14/20 0711 05/15/20 0039 05/16/20 0045 05/17/20 0034  WBC 17.4*  --   --  16.8* 14.5* 12.4* 12.8*  NEUTROABS  --   --   --   --  12.6* 10.2* 10.8*  HGB 6.6*   < > 7.5* 7.1* 7.7* 7.8* 7.3*  HCT 22.7*   < > 22.0* 23.6* 24.6* 25.7* 24.2*  MCV 100.0  --   --  98.7 93.5  94.8 96.4  PLT 180  --   --  162 133* 121* 121*   < > = values in this interval not displayed.   Basic Metabolic Panel: Recent Labs  Lab 05/13/20 1718 05/13/20 1718 05/14/20 0036 05/14/20 0711 05/15/20 0039 05/16/20 0045 05/17/20 0034  NA 132*   < > 130* 131* 133* 129* 132*  K 5.9*   < > 5.8* 6.3* 4.8 4.1 3.4*  CL 88*  --   --  88* 92* 89* 93*  CO2 23  --   --  20* 22 23 25   GLUCOSE 177*  --   --  229* 198* 225* 237*  BUN 43*  --   --  52* 30* 45* 31*  CREATININE 7.27*  --   --  7.41* 4.55* 6.01* 4.43*  CALCIUM 9.0  --   --  9.1 8.4* 8.8* 8.7*  MG  --   --   --   --  2.0  --   --   PHOS  --   --   --   --  5.1*  --   --    < > = values in this interval not displayed.   GFR: Estimated Creatinine Clearance: 19.7 mL/min (A) (by C-G formula based on SCr of 4.43 mg/dL (H)). Liver Function Tests: Recent Labs  Lab 05/15/20 0039  ALBUMIN 2.3*   No results for input(s): LIPASE, AMYLASE in the last 168 hours. No results for input(s): AMMONIA in the last 168 hours. Coagulation Profile: Recent Labs  Lab 05/13/20 1718  INR 1.7*   Cardiac Enzymes: No results for input(s): CKTOTAL, CKMB, CKMBINDEX, TROPONINI in the last 168 hours. BNP (last 3 results) No results for input(s): PROBNP in the last 8760 hours. HbA1C: No results for input(s): HGBA1C in the last 72 hours. CBG: Recent Labs  Lab 05/16/20 1234 05/16/20 1627 05/16/20 1929 05/16/20 2356 05/17/20 0351  GLUCAP 163* 197* 267* 225* 231*   Lipid Profile: No results for input(s): CHOL, HDL, LDLCALC, TRIG, CHOLHDL, LDLDIRECT in the last 72 hours. Thyroid Function Tests: No results for input(s): TSH, T4TOTAL, FREET4, T3FREE, THYROIDAB in the last 72 hours. Anemia Panel: No results for input(s): VITAMINB12, FOLATE, FERRITIN, TIBC, IRON, RETICCTPCT in the last 72 hours. Sepsis Labs: No results for input(s): PROCALCITON, LATICACIDVEN in the last 168 hours.  Recent Results (from the past 240 hour(s))  Respiratory Panel  by RT PCR (Flu A&B, Covid) - Nasopharyngeal Swab     Status: None   Collection Time: 05/13/20  5:07 PM   Specimen: Nasopharyngeal Swab  Result Value Ref Range Status   SARS Coronavirus 2 by RT PCR NEGATIVE NEGATIVE Final    Comment: (NOTE) SARS-CoV-2 target nucleic acids are NOT DETECTED.  The SARS-CoV-2 RNA is generally detectable in upper respiratoy specimens during the acute phase of infection. The lowest concentration of SARS-CoV-2 viral copies this assay can detect is 131 copies/mL. A negative result does not preclude SARS-Cov-2 infection and should not be used as the sole basis for treatment or other patient management decisions. A negative result may occur with  improper specimen collection/handling, submission of specimen other than nasopharyngeal swab, presence of viral mutation(s) within the areas targeted by this assay, and inadequate number of viral copies (<131 copies/mL). A negative result must be combined with clinical observations, patient history, and epidemiological information. The expected result is Negative.  Fact Sheet for Patients:  PinkCheek.be  Fact Sheet for Healthcare Providers:  GravelBags.it  This test is no t yet approved or cleared by the [place] FDA and  has been authorized for detection and/or diagnosis of SARS-CoV-2 by FDA under an Emergency Use Authorization (EUA). This EUA will remain  in effect (meaning this test can be used) for the duration of the COVID-19 declaration under Section 564(b)(1) of the Act, 21 U.S.C. section 360bbb-3(b)(1), unless the authorization is terminated or revoked sooner.     Influenza A by PCR NEGATIVE NEGATIVE Final   Influenza B by PCR NEGATIVE NEGATIVE Final    Comment: (NOTE) The Xpert Xpress SARS-CoV-2/FLU/RSV assay is intended as an aid in  the diagnosis of influenza from Nasopharyngeal swab specimens and  should not be used as a sole basis for  treatment. Nasal washings and  aspirates are unacceptable for Xpert Xpress SARS-CoV-2/FLU/RSV  testing.  Fact Sheet for Patients: PinkCheek.be  Fact Sheet for Healthcare Providers: GravelBags.it  This test is not yet approved or cleared by the [place] and  has been authorized for detection and/or diagnosis of SARS-CoV-2 by  FDA under an Emergency Use Authorization (EUA). This EUA will remain  in effect (meaning this test can be used) for the duration of the  Covid-19 declaration under Section 564(b)(1) of the Act, 21  U.S.C. section 360bbb-3(b)(1), unless the authorization is  terminated or revoked. Performed at [facility], [place] [address] [place],  [address]          Radiology Studies: US Abdomen Limited  Result Date: 05/15/2020 CLINICAL DATA:  Wound left lateral abdominal wall EXAM: ULTRASOUND LEFT LATERAL ABDOMINAL WALL COMPARISON:  None. FINDINGS: Longitudinal and transverse images were obtained in the left lateral abdominal wall soft tissue region. Ultrasound of this area shows soft tissue induration without mass or fluid collection. No abscess evident by ultrasound in this region. IMPRESSION: Soft tissue induration in area lateral left abdominal wall without well-defined mass or abscess. No abnormal fluid in this area. If further evaluation of this area is felt to be warranted, CT through this area, ideally with intravenous contrast, could be helpful to further evaluate. Electronically Signed   By: Lowella Grip III M.D.   On: 05/15/2020 11:52        Scheduled Meds: . Chlorhexidine Gluconate Cloth  6 each Topical Q0600  . collagenase   Topical Daily  . doxercalciferol  2 mcg Intravenous Q M,W,F-HD  . doxycycline  100 mg Oral BID  . insulin aspart  0-6 Units Subcutaneous Q4H  . insulin detemir  15 Units Subcutaneous Daily  . midodrine  10 mg Oral Q M,W,F-HD  . montelukast  10 mg Oral  Daily  . pravastatin  40 mg Oral Daily  . sodium zirconium cyclosilicate  10 g Oral Daily   Continuous Infusions:        Aline August, MD [facility] 05/17/2020, 7:24 AM

## 2020-05-17 NOTE — Progress Notes (Signed)
Palliative: Ms. Suzanne, Levy, is lying quietly in bed with her sister/healthcar surrogate, Suzanne Levy, at bedside.  She is alert and oriented to person and place, but has trouble recalling the month.  We talk about her treatment plan and disposition.  I Levy the worry that Suzanne Levy will no longer be able to care for herself independently.  Suzanne Levy and Suzanne Levy both agree that Suzanne Levy will need LTC placement.  Both Suzanne Levy and Suzanne Levy Levy their desire for Suzanne Levy to dc to [facility] because they have HD on site. I Levy that SW is working for placement and knows their request, but [facility] must accept [place]. They state understanding.    Suzanne Levy is tearful stating, "my baby is so sick".  (Suzanne Levy is the baby of the family.)  Suzanne Levy shares that Suzanne Levy was "fine" until she went to [employer].  I Levy that Suzanne Levy has not been in good health for quite sometime as evidenced by her CKD on HD, obesity, wounds. Suzanne Levy states acknowledgement.   We talk about CODE STATUS, "treat the treatable, but allow a natural death".  Both Suzanne Levy agree with DNR.  Orders adjusted.  Suzanne Levy asks for assistance with completing Suzanne Levy and Suzanne Levy.  Unit secretary conference with [place] and will assist.   Conference with attending, bedside nursing staff and TOC related to patent condition, needs, GOC, code status, and disposition.   Plan:    Continue to treat the treatable, but no CPR or intubation.  Agreeable to STR with eventual LTC placement.          31 minutes  [name], NP Palliative medicine team Team phone [phone] Greater than 50% of this time was spent counseling and coordinating care related to the above assessment and plan.

## 2020-05-17 NOTE — Progress Notes (Addendum)
HOSPITAL MEDICINE OVERNIGHT EVENT NOTE    Notified by nursing that patient is exhibiting extremely low blood pressures this evening, many of them in the 51'W systolic.  I went to evaluate the patient at the bedside.   Patient has no complaints.  She denies light headedness, weakness or shortness of breath.    On exam, patient is lethargic albeit arousable and able to follow commands.  Heart sounds are regular with bibasilar rales on lung exam. Abdomen is soft and nontender.  Dressing RLE C/D/I.    I have repeated the patient's CBC to ensure Hgb is stable.  Repeat hgb 7.3 which would not be the cause of her hypotension.    Chart reviewed, I notice that the patient has the diagnosis of "chronic hypotension" and is on regular Midodrine dosing on days which she receives her dialysis.  I question whether these blood pressures are even accurate seeing as how they are obtained from her right forearm, especially if clinically she is unchanged otherwise.   I am particularly skeptical of the's BP's when review of previous systolic BP's during this hospital stay show BP's as low as the 30's.    Will give trial 250cc LR bolus, montior BP's.  Nursing to notify me if patient exhibits any clinical change.    Vernelle Emerald  MD [facility]

## 2020-05-18 DIAGNOSIS — D62 Acute posthemorrhagic anemia: Secondary | ICD-10-CM | POA: Diagnosis not present

## 2020-05-18 DIAGNOSIS — S82842A Displaced bimalleolar fracture of left lower leg, initial encounter for closed fracture: Secondary | ICD-10-CM | POA: Diagnosis not present

## 2020-05-18 DIAGNOSIS — N186 End stage renal disease: Secondary | ICD-10-CM | POA: Diagnosis not present

## 2020-05-18 DIAGNOSIS — S8011XA Contusion of right lower leg, initial encounter: Secondary | ICD-10-CM | POA: Diagnosis not present

## 2020-05-18 LAB — BASIC METABOLIC PANEL
Anion gap: 15 (ref 5–15)
BUN: 39 mg/dL — ABNORMAL HIGH (ref 8–23)
CO2: 23 mmol/L (ref 22–32)
Calcium: 8.9 mg/dL (ref 8.9–10.3)
Chloride: 92 mmol/L — ABNORMAL LOW (ref 98–111)
Creatinine, Ser: 5.46 mg/dL — ABNORMAL HIGH (ref 0.44–1.00)
GFR calc non Af Amer: 7 mL/min — ABNORMAL LOW (ref >60)
Glucose, Bld: 183 mg/dL — ABNORMAL HIGH (ref 70–99)
Potassium: 3.5 mmol/L (ref 3.5–5.1)
Sodium: 130 mmol/L — ABNORMAL LOW (ref 135–145)

## 2020-05-18 LAB — CBC WITH DIFFERENTIAL/PLATELET
Abs Immature Granulocytes: 0.13 10*3/uL — ABNORMAL HIGH (ref 0.00–0.07)
Basophils Absolute: 0 10*3/uL (ref 0.0–0.1)
Basophils Relative: 0 %
Eosinophils Absolute: 0.1 10*3/uL (ref 0.0–0.5)
Eosinophils Relative: 1 %
HCT: 25 % — ABNORMAL LOW (ref 36.0–46.0)
Hemoglobin: 7.8 g/dL — ABNORMAL LOW (ref 12.0–15.0)
Immature Granulocytes: 1 %
Lymphocytes Relative: 7 %
Lymphs Abs: 0.9 10*3/uL (ref 0.7–4.0)
MCH: 30.6 pg (ref 26.0–34.0)
MCHC: 31.2 g/dL (ref 30.0–36.0)
MCV: 98 fL (ref 80.0–100.0)
Monocytes Absolute: 0.9 10*3/uL (ref 0.1–1.0)
Monocytes Relative: 8 %
Neutro Abs: 10.3 10*3/uL — ABNORMAL HIGH (ref 1.7–7.7)
Neutrophils Relative %: 83 %
Platelets: 127 10*3/uL — ABNORMAL LOW (ref 150–400)
RBC: 2.55 MIL/uL — ABNORMAL LOW (ref 3.87–5.11)
RDW: 24 % — ABNORMAL HIGH (ref 11.5–15.5)
WBC: 12.4 10*3/uL — ABNORMAL HIGH (ref 4.0–10.5)
nRBC: 0.4 % — ABNORMAL HIGH (ref 0.0–0.2)

## 2020-05-18 LAB — GLUCOSE, CAPILLARY
Glucose-Capillary: 157 mg/dL — ABNORMAL HIGH (ref 70–99)
Glucose-Capillary: 157 mg/dL — ABNORMAL HIGH (ref 70–99)
Glucose-Capillary: 201 mg/dL — ABNORMAL HIGH (ref 70–99)
Glucose-Capillary: 149 mg/dL — ABNORMAL HIGH (ref 70–99)
Glucose-Capillary: 134 mg/dL — ABNORMAL HIGH (ref 70–99)
Glucose-Capillary: 170 mg/dL — ABNORMAL HIGH (ref 70–99)

## 2020-05-18 MED ORDER — DOXERCALCIFEROL 4 MCG/2ML IV SOLN
INTRAVENOUS | Status: AC
  Administered 2020-05-18: 2 ug via INTRAVENOUS
  Filled 2020-05-18: qty 2

## 2020-05-18 MED ORDER — ALBUMIN HUMAN 25 % IV SOLN
25.0000 g | Freq: Once | INTRAVENOUS | Status: AC
  Administered 2020-05-18: 25 g via INTRAVENOUS

## 2020-05-18 MED ORDER — ALBUMIN HUMAN 25 % IV SOLN
INTRAVENOUS | Status: AC
  Filled 2020-05-18: qty 100

## 2020-05-18 NOTE — Progress Notes (Signed)
5 Days Post-Op   Subjective/Chief Complaint: No new complaints   Objective: Vital signs in last 24 hours: Temp:  [97.4 F (36.3 C)-98.7 F (37.1 C)] 98.1 F (36.7 C) (10/08 0340) Pulse Rate:  [80-92] 82 (10/08 0340) Resp:  [15-19] 18 (10/08 0340) BP: (83-119)/(41-89) 119/89 (10/08 0340) SpO2:  [94 %-99 %] 94 % (10/08 0340) Weight:  [194.6 kg] 194.6 kg (10/08 0340) Last BM Date: 05/14/20  Intake/Output from previous day: 10/07 0701 - 10/08 0700 In: 120 [P.O.:120] Out: -  Intake/Output this shift: No intake/output data recorded.  Extremities: R medial calf wound with blistering and evolving ecchymoses, serous drainage from blister. Re-dressed with xeroform gauze, ABD, kerlix.      Lab Results:  Recent Labs    05/17/20 0034 05/18/20 0013  WBC 12.8* 12.4*  HGB 7.3* 7.8*  HCT 24.2* 25.0*  PLT 121* 127*   BMET Recent Labs    05/17/20 0034 05/18/20 0013  NA 132* 130*  K 3.4* 3.5  CL 93* 92*  CO2 25 23  GLUCOSE 237* 183*  BUN 31* 39*  CREATININE 4.43* 5.46*  CALCIUM 8.7* 8.9   Studies/Results: No results found.  Anti-infectives: Anti-infectives (From admission, onward)   Start     Dose/Rate Route Frequency Ordered Stop   05/14/20 2200  doxycycline (VIBRA-TABS) tablet 100 mg        100 mg Oral 2 times daily 05/14/20 1017 05/17/20 2150   05/14/20 1000  doxycycline (VIBRA-TABS) tablet 100 mg  Status:  Discontinued        100 mg Oral 2 times daily 05/13/20 2038 05/14/20 1017   05/14/20 1000  linezolid (ZYVOX) tablet 600 mg  Status:  Discontinued        600 mg Oral 2 times daily 05/13/20 2038 05/14/20 1017   05/13/20 1945  clindamycin (CLEOCIN) IVPB 900 mg        900 mg 100 mL/hr over 30 Minutes Intravenous On call to O.R. 05/13/20 1933 05/14/20 0040      Assessment/Plan: ESRD Hypotension HLD Asthma DM-2 Morbid obesity BMI 26 H/o PE/DVT on eliquis ABL anemia - Hgb 7.3  Ruptured hematoma -S/p evacuation of hematoma from right leg, cauterization of  bleeding areas, closure of 20 cm of wounds 10/4 Dr. Kieth Brightly - Wound now with blistering. Part of the skin flap may not survive. Continue local care and we will follow. Likely will benefit from being followed at the wound care center after D/C if feasible.  ID - doxycycline PO 10/4-10/7 FEN - CM diet VTE - SCD Foley - none   LOS: 5 days    Jill Alexanders 05/18/2020

## 2020-05-18 NOTE — Progress Notes (Signed)
[facility] Progress Note  Subjective: seen in room today, no c/o's, leg pain slowly getting better  Vitals:   05/18/20 1400 05/18/20 1405 05/18/20 1430 05/18/20 1445  BP: (!) 95/35 (!) 79/55 (!) 75/26 (!) 66/33  Pulse:      Resp: 19 20 20 20   Temp:      TempSrc:      SpO2:      Weight:      Height:        Exam: General: Obese female, nad  Lungs: Clear, anteriorly, diminished breath sounds.  Breathing is unlabored. Heart: RRR with S1 S2 Abdomen: soft obese, non-tender  Lower extremities: R leg in bandage; 1+ pitting hip/ leg edema bilat Neuro: A & O  X 3. Moves all extremities spontaneously. Dialysis Access: LUE AVG +bruit; some bruising to LUE     OP HD:  AF MWF 4h 71min 500/800   156kg  2K/2.25Ca   L AVG   Heparin none Hectorol 2  Venofer 50 q week Mircera 225 (last 9/27)   Assessment/Plan: 1. Traumatic hematoma of RLE - s/p evacuation 10/4. Wound care big issue now.  2. ESRD -  HD MWF. HD today, no heparin  3. Volume- Chronic hypotension on midodrine. 2kg over by wts, mild leg edema, UF as tolerated w HD.  4. Hypokalemia - dc'd lokelma, supplement prn 5. Anemia  - Post op blood loss noted. S/p 2 u prbcs 10/3.  Recent ESA dose as outpatient. Hb 7- 8 range here.  6. Metabolic bone disease -  Ca/phos ok.  Continue binders/Hectorol.  7. Nutrition - Renal diet/vitalmins 8. Hx PE - Holding Eliquis  9. H/o blindness 10. IDDM - per primary 11. Morbid obesity  12. Debility / WC dependent 13. GOC - seen by pall care. Per pmd poor prognosis, pt is now DNR status. Pt/ family hoping for dc to [facility].    Rob Judea Riches  MD 05/18/2020, 2:59 PM     Recent Labs  Lab 05/15/20 0039 05/16/20 0045 05/17/20 0034 05/18/20 0013  K 4.8   < > 3.4* 3.5  BUN 30*   < > 31* 39*  CREATININE 4.55*   < > 4.43* 5.46*  CALCIUM 8.4*   < > 8.7* 8.9  PHOS 5.1*  --   --   --   HGB 7.7*   < > 7.3* 7.8*   < > = values in this interval not displayed.   Inpatient  medications: . Chlorhexidine Gluconate Cloth  6 each Topical Q0600  . collagenase   Topical Daily  . doxercalciferol  2 mcg Intravenous Q M,W,F-HD  . insulin aspart  0-6 Units Subcutaneous Q4H  . insulin detemir  20 Units Subcutaneous Daily  . midodrine  10 mg Oral TID WC  . montelukast  10 mg Oral Daily  . pravastatin  40 mg Oral Daily    acetaminophen **OR** acetaminophen, albuterol, hydrOXYzine, ondansetron **OR** ondansetron (ZOFRAN) IV, oxyCODONE, traMADol

## 2020-05-18 NOTE — Progress Notes (Signed)
Patient ID: Suzanne Levy, female   DOB: 1951-04-14, 69 y.o.   MRN: [id number]  PROGRESS NOTE    Suzanne Levy  KVQ:[id number] DOB: [birth year] DOA: 05/13/2020 PCP: [name], MD   Brief Narrative:  69 year old female, morbidly obese, with past medical history significant for ESRD on MWF HD, history of PE on Eliquis, insulin-dependent type 2 diabetes, hypertension, hyperlipidemia, morbid obesity and asthma who is wheelchair-bound was admitted with ruptured hematoma of the right lower leg.  She underwent evacuation of the hematoma, cauterization of bleeding areas and closure of the wound by general surgery.  Nephrology is following for dialysis needs.  Assessment & Plan:   Ruptured hematoma of the right lower extremity -Occurred in the setting of anticoagulation use -Status post evacuation of the hematoma, cauterization of the bleeding vessels and closure of the large wound by general surgery -Doxycycline has been discontinued by general surgery -Eliquis still on hold. -Wound care as per general surgery recommendations  Acute blood loss anemia -Due to above.  Status post 2 units packed red cells transfusion.  Hemoglobin 7.8 today.  End-stage renal disease on hemodialysis -Nephrology following.  Hemodialysis as per nephrology schedule and recommendations  Chronic hypotension -Continue midodrine.  Hyperkalemia -Lokelma has been discontinued.  Resolved.  Nephrology following.  History of PE/DVT on Eliquis -Eliquis on hold  Insulin-dependent diabetes mellitus type 2 uncontrolled with hyperglycemia -Blood sugars on the higher side.  Continue CBGs with SSI. Continue Lantus.  Leukocytosis - Monitor  Thrombocytopenia -Questionable cause.  Monitor  Morbid obesity/OSA/OHS -Continue CPAP at bedtime.  Left ankle and pannus wounds -Recent admission at [facility] from 04/11/2020-04/21/2020 requiring debridement of right flank and groin wounds on 04/14/2020.  On oral antibiotics as an  outpatient -Wound care team input is appreciated.  General surgery following  Left foot fractures -X-ray of the left ankle is showing bimalleolar fracture.  Orthopedics recommended conservative management with CAM boot and office follow-up.  Generalized deconditioning -Overall prognosis is guarded to poor.  Palliative care following. CODE STATUS changed to DNR. Patient/family hoping that patient will be discharged to [facility]. Social worker aware.  DVT prophylaxis: SCDs Code Status: Full Family Communication: None at bedside Disposition Plan: Status is: Inpatient  Remains inpatient appropriate because:Inpatient level of care appropriate due to severity of illness. SNF placement.   Dispo: The patient is from: Home              Anticipated d/c is to: SNF              Anticipated d/c date is: 3 days              Patient currently is not medically stable to d/c.  Consultants: General surgery/nephrology.  Orthopedics, palliative care  Procedures: Evacuation of hematoma from right leg, cauterization of bleeding areas, closure of 20 cm of wounds on 05/14/2020 by general surgery  Antimicrobials:  Anti-infectives (From admission, onward)   Start     Dose/Rate Route Frequency Ordered Stop   05/14/20 2200  doxycycline (VIBRA-TABS) tablet 100 mg        100 mg Oral 2 times daily 05/14/20 1017 05/17/20 2150   05/14/20 1000  doxycycline (VIBRA-TABS) tablet 100 mg  Status:  Discontinued        100 mg Oral 2 times daily 05/13/20 2038 05/14/20 1017   05/14/20 1000  linezolid (ZYVOX) tablet 600 mg  Status:  Discontinued        600 mg Oral 2 times daily 05/13/20 2038 05/14/20 1017   05/13/20  1945  clindamycin (CLEOCIN) IVPB 900 mg        900 mg 100 mL/hr over 30 Minutes Intravenous On call to O.R. 05/13/20 1933 05/14/20 0040       Subjective: Patient seen and examined at bedside. Poor historian. Still does not participate in conversation much. No overnight fever, vomiting or worsening shortness  of breath reported.  Objective: Vitals:   05/17/20 1127 05/17/20 1951 05/17/20 2328 05/18/20 0340  BP: 112/80 (!) 90/48 (!) 83/41 119/89  Pulse: 92 81 80 82  Resp: 15 19 18 18   Temp: (!) 97.4 F (36.3 C) 98.7 F (37.1 C) 98.3 F (36.8 C) 98.1 F (36.7 C)  TempSrc: Axillary Axillary Oral Oral  SpO2: 99% 94% 94% 94%  Weight:    (!) 194.6 kg  Height:        Intake/Output Summary (Last 24 hours) at 05/18/2020 0720 Last data filed at 05/18/2020 0000 Gross per 24 hour  Intake 120 ml  Output --  Net 120 ml   Filed Weights   05/16/20 0500 05/18/20 0340  Weight: (!) 158.3 kg (!) 194.6 kg    Examination:  General exam: No acute distress. Chronically ill looking.  Looks older than stated age. Very poor historian. Respiratory system: Bilateral decreased breath sounds at bases with some crackles, no wheezing  cardiovascular system: Rate controlled, S1-S2 heard Gastrointestinal system: Abdomen is morbidly obese, distended, soft and nontender. Bowel sounds are heard Extremities: Dressing over the right thigh present. Mild lower extremity edema present. No cyanosis  Central nervous system: No focal neurological deficits. Moving extremities. Still does not participate in conversation much Skin: No other petechiae  psychiatry: Extremely flat affect  Data Reviewed: I have personally reviewed following labs and imaging studies  CBC: Recent Labs  Lab 05/14/20 0711 05/15/20 0039 05/16/20 0045 05/17/20 0034 05/18/20 0013  WBC 16.8* 14.5* 12.4* 12.8* 12.4*  NEUTROABS  --  12.6* 10.2* 10.8* 10.3*  HGB 7.1* 7.7* 7.8* 7.3* 7.8*  HCT 23.6* 24.6* 25.7* 24.2* 25.0*  MCV 98.7 93.5 94.8 96.4 98.0  PLT 162 133* 121* 121* 939*   Basic Metabolic Panel: Recent Labs  Lab 05/14/20 0711 05/15/20 0039 05/16/20 0045 05/17/20 0034 05/18/20 0013  NA 131* 133* 129* 132* 130*  K 6.3* 4.8 4.1 3.4* 3.5  CL 88* 92* 89* 93* 92*  CO2 20* 22 23 25 23   GLUCOSE 229* 198* 225* 237* 183*  BUN 52* 30*  45* 31* 39*  CREATININE 7.41* 4.55* 6.01* 4.43* 5.46*  CALCIUM 9.1 8.4* 8.8* 8.7* 8.9  MG  --  2.0  --   --   --   PHOS  --  5.1*  --   --   --    GFR: Estimated Creatinine Clearance: 18.2 mL/min (A) (by C-G formula based on SCr of 5.46 mg/dL (H)). Liver Function Tests: Recent Labs  Lab 05/15/20 0039  ALBUMIN 2.3*   No results for input(s): LIPASE, AMYLASE in the last 168 hours. No results for input(s): AMMONIA in the last 168 hours. Coagulation Profile: Recent Labs  Lab 05/13/20 1718  INR 1.7*   Cardiac Enzymes: No results for input(s): CKTOTAL, CKMB, CKMBINDEX, TROPONINI in the last 168 hours. BNP (last 3 results) No results for input(s): PROBNP in the last 8760 hours. HbA1C: No results for input(s): HGBA1C in the last 72 hours. CBG: Recent Labs  Lab 05/17/20 1125 05/17/20 1637 05/17/20 2014 05/17/20 2327 05/18/20 0351  GLUCAP 238* 195* 235* 197* 157*   Lipid Profile: No results  for input(s): CHOL, HDL, LDLCALC, TRIG, CHOLHDL, LDLDIRECT in the last 72 hours. Thyroid Function Tests: No results for input(s): TSH, T4TOTAL, FREET4, T3FREE, THYROIDAB in the last 72 hours. Anemia Panel: No results for input(s): VITAMINB12, FOLATE, FERRITIN, TIBC, IRON, RETICCTPCT in the last 72 hours. Sepsis Labs: No results for input(s): PROCALCITON, LATICACIDVEN in the last 168 hours.  Recent Results (from the past 240 hour(s))  Respiratory Panel by RT PCR (Flu A&B, Covid) - Nasopharyngeal Swab     Status: None   Collection Time: 05/13/20  5:07 PM   Specimen: Nasopharyngeal Swab  Result Value Ref Range Status   SARS Coronavirus 2 by RT PCR NEGATIVE NEGATIVE Final    Comment: (NOTE) SARS-CoV-2 target nucleic acids are NOT DETECTED.  The SARS-CoV-2 RNA is generally detectable in upper respiratoy specimens during the acute phase of infection. The lowest concentration of SARS-CoV-2 viral copies this assay can detect is 131 copies/mL. A negative result does not preclude  SARS-Cov-2 infection and should not be used as the sole basis for treatment or other patient management decisions. A negative result may occur with  improper specimen collection/handling, submission of specimen other than nasopharyngeal swab, presence of viral mutation(s) within the areas targeted by this assay, and inadequate number of viral copies (<131 copies/mL). A negative result must be combined with clinical observations, patient history, and epidemiological information. The expected result is Negative.  Fact Sheet for Patients:  PinkCheek.be  Fact Sheet for Healthcare Providers:  GravelBags.it  This test is no t yet approved or cleared by the [place] FDA and  has been authorized for detection and/or diagnosis of SARS-CoV-2 by FDA under an Emergency Use Authorization (EUA). This EUA will remain  in effect (meaning this test can be used) for the duration of the COVID-19 declaration under Section 564(b)(1) of the Act, 21 U.S.C. section 360bbb-3(b)(1), unless the authorization is terminated or revoked sooner.     Influenza A by PCR NEGATIVE NEGATIVE Final   Influenza B by PCR NEGATIVE NEGATIVE Final    Comment: (NOTE) The Xpert Xpress SARS-CoV-2/FLU/RSV assay is intended as an aid in  the diagnosis of influenza from Nasopharyngeal swab specimens and  should not be used as a sole basis for treatment. Nasal washings and  aspirates are unacceptable for Xpert Xpress SARS-CoV-2/FLU/RSV  testing.  Fact Sheet for Patients: PinkCheek.be  Fact Sheet for Healthcare Providers: GravelBags.it  This test is not yet approved or cleared by the [place] FDA and  has been authorized for detection and/or diagnosis of SARS-CoV-2 by  FDA under an Emergency Use Authorization (EUA). This EUA will remain  in effect (meaning this test can be used) for the duration of the   Covid-19 declaration under Section 564(b)(1) of the Act, 21  U.S.C. section 360bbb-3(b)(1), unless the authorization is  terminated or revoked. Performed at [facility], [place] [address], [place],  [address]          Radiology Studies: No results found.      Scheduled Meds: . Chlorhexidine Gluconate Cloth  6 each Topical Q0600  . collagenase   Topical Daily  . doxercalciferol  2 mcg Intravenous Q M,W,F-HD  . insulin aspart  0-6 Units Subcutaneous Q4H  . insulin detemir  20 Units Subcutaneous Daily  . midodrine  10 mg Oral TID WC  . montelukast  10 mg Oral Daily  . pravastatin  40 mg Oral Daily   Continuous Infusions:        Aline August, MD  [facility] 05/18/2020, 7:20 AM

## 2020-05-19 DIAGNOSIS — S82842A Displaced bimalleolar fracture of left lower leg, initial encounter for closed fracture: Secondary | ICD-10-CM | POA: Diagnosis not present

## 2020-05-19 DIAGNOSIS — N186 End stage renal disease: Secondary | ICD-10-CM | POA: Diagnosis not present

## 2020-05-19 DIAGNOSIS — S8011XA Contusion of right lower leg, initial encounter: Secondary | ICD-10-CM | POA: Diagnosis not present

## 2020-05-19 DIAGNOSIS — D62 Acute posthemorrhagic anemia: Secondary | ICD-10-CM | POA: Diagnosis not present

## 2020-05-19 LAB — GLUCOSE, CAPILLARY
Glucose-Capillary: 135 mg/dL — ABNORMAL HIGH (ref 70–99)
Glucose-Capillary: 146 mg/dL — ABNORMAL HIGH (ref 70–99)
Glucose-Capillary: 171 mg/dL — ABNORMAL HIGH (ref 70–99)
Glucose-Capillary: 181 mg/dL — ABNORMAL HIGH (ref 70–99)
Glucose-Capillary: 187 mg/dL — ABNORMAL HIGH (ref 70–99)
Glucose-Capillary: 190 mg/dL — ABNORMAL HIGH (ref 70–99)

## 2020-05-19 LAB — CBC WITH DIFFERENTIAL/PLATELET
Abs Immature Granulocytes: 0.09 10*3/uL — ABNORMAL HIGH (ref 0.00–0.07)
Basophils Absolute: 0 10*3/uL (ref 0.0–0.1)
Basophils Relative: 0 %
Eosinophils Absolute: 0.1 10*3/uL (ref 0.0–0.5)
Eosinophils Relative: 1 %
HCT: 27.3 % — ABNORMAL LOW (ref 36.0–46.0)
Hemoglobin: 8.3 g/dL — ABNORMAL LOW (ref 12.0–15.0)
Immature Granulocytes: 1 %
Lymphocytes Relative: 2 %
Lymphs Abs: 0.2 10*3/uL — ABNORMAL LOW (ref 0.7–4.0)
MCH: 30.1 pg (ref 26.0–34.0)
MCHC: 30.4 g/dL (ref 30.0–36.0)
MCV: 98.9 fL (ref 80.0–100.0)
Monocytes Absolute: 0.7 10*3/uL (ref 0.1–1.0)
Monocytes Relative: 7 %
Neutro Abs: 9.5 10*3/uL — ABNORMAL HIGH (ref 1.7–7.7)
Neutrophils Relative %: 89 %
Platelets: 130 10*3/uL — ABNORMAL LOW (ref 150–400)
RBC: 2.76 MIL/uL — ABNORMAL LOW (ref 3.87–5.11)
RDW: 24.8 % — ABNORMAL HIGH (ref 11.5–15.5)
WBC: 10.6 10*3/uL — ABNORMAL HIGH (ref 4.0–10.5)
nRBC: 0 % (ref 0.0–0.2)

## 2020-05-19 LAB — BASIC METABOLIC PANEL
Anion gap: 15 (ref 5–15)
BUN: 21 mg/dL (ref 8–23)
CO2: 25 mmol/L (ref 22–32)
Calcium: 8.8 mg/dL — ABNORMAL LOW (ref 8.9–10.3)
Chloride: 95 mmol/L — ABNORMAL LOW (ref 98–111)
Creatinine, Ser: 3.66 mg/dL — ABNORMAL HIGH (ref 0.44–1.00)
GFR, Estimated: 12 mL/min — ABNORMAL LOW (ref >60)
Glucose, Bld: 149 mg/dL — ABNORMAL HIGH (ref 70–99)
Potassium: 4 mmol/L (ref 3.5–5.1)
Sodium: 135 mmol/L (ref 135–145)

## 2020-05-19 NOTE — Progress Notes (Signed)
[facility] Progress Note  Subjective: seen in room today, lying flat, no sOb or cough  Vitals:   05/18/20 2300 05/19/20 0326 05/19/20 0803 05/19/20 1049  BP: 129/75 (!) 142/70 (!) 116/50 109/66  Pulse: 97 90 99 99  Resp: 16 19 20 19   Temp:  98.5 F (36.9 C) 98.5 F (36.9 C) 99 F (37.2 C)  TempSrc:  Oral Oral Axillary  SpO2: 93% 94% 90% 92%  Weight:      Height:        Exam: General: Obese female, nad  Lungs: Clear, anteriorly, diminished breath sounds.  Breathing is unlabored. Heart: RRR with S1 S2 Abdomen: soft obese, non-tender  Lower extremities: R leg in bandage; 1+ pitting hip/ leg edema bilat Neuro: A & O  X 3. Moves all extremities spontaneously. Dialysis Access: LUE AVG +bruit; some bruising to LUE     OP HD:  AF MWF 4h 72min 500/800   156kg  2K/2.25Ca   L AVG   Heparin none Hectorol 2  Venofer 50 q week Mircera 225 (last 9/27)   Assessment/Plan: 1. Traumatic hematoma of RLE - s/p evacuation 10/4 2. ESRD -  HD MWF. Next HD monday.  No heparin  3. Volume- Chronic hypotension on midodrine. Significant LE edema unable to pull volume due to BP drops on HD. She was started here on midodrine 10 tid.   4. Anemia  - Post op blood loss noted. S/p 2 u prbcs 10/3.  Recent ESA dose as outpatient. Hb 7- 8 range here.  5. Metabolic bone disease -  Ca/phos ok.  Continue binders/Hectorol.  6. Nutrition - Renal diet/vitalmins 7. Hx PE - Holding Eliquis  8. H/o blindness 9. IDDM - per primary 10. Morbid obesity/ debility / WC dependent  11. Prognosis - pt is now DNR, appreciate [facility] and palliative assistance. Family is hoping for LTAC acceptance. Would not be able to do OP dialysis in her condition.    Rob Nery Kalisz  MD 05/19/2020, 12:34 PM     Recent Labs  Lab 05/15/20 0039 05/16/20 0045 05/18/20 0013 05/19/20 0052  K 4.8   < > 3.5 4.0  BUN 30*   < > 39* 21  CREATININE 4.55*   < > 5.46* 3.66*  CALCIUM 8.4*   < > 8.9 8.8*  PHOS 5.1*  --   --    --   HGB 7.7*   < > 7.8* 8.3*   < > = values in this interval not displayed.   Inpatient medications: . Chlorhexidine Gluconate Cloth  6 each Topical Q0600  . collagenase   Topical Daily  . doxercalciferol  2 mcg Intravenous Q M,W,F-HD  . insulin aspart  0-6 Units Subcutaneous Q4H  . insulin detemir  20 Units Subcutaneous Daily  . midodrine  10 mg Oral TID WC  . montelukast  10 mg Oral Daily  . pravastatin  40 mg Oral Daily    acetaminophen **OR** acetaminophen, albuterol, hydrOXYzine, ondansetron **OR** ondansetron (ZOFRAN) IV, oxyCODONE, traMADol

## 2020-05-19 NOTE — Progress Notes (Signed)
6 Days Post-Op   Subjective/Chief Complaint: Complains of leg pain, unchanged   Objective: Vital signs in last 24 hours: Temp:  [97.7 F (36.5 C)-98.5 F (36.9 C)] 98.5 F (36.9 C) (10/09 0326) Pulse Rate:  [79-97] 90 (10/09 0326) Resp:  [16-23] 19 (10/09 0326) BP: (66-149)/(24-123) 142/70 (10/09 0326) SpO2:  [93 %-98 %] 94 % (10/09 0326) Last BM Date: 05/16/20  Intake/Output from previous day: 10/08 0701 - 10/09 0700 In: 530 [P.O.:480; IV Piggyback:50] Out: -225  Intake/Output this shift: No intake/output data recorded.  Exam: Right leg wound unchanged today, a little weeping, no purulence, soft Staples intact  Lab Results:  Recent Labs    05/18/20 0013 05/19/20 0052  WBC 12.4* 10.6*  HGB 7.8* 8.3*  HCT 25.0* 27.3*  PLT 127* 130*   BMET Recent Labs    05/18/20 0013 05/19/20 0052  NA 130* 135  K 3.5 4.0  CL 92* 95*  CO2 23 25  GLUCOSE 183* 149*  BUN 39* 21  CREATININE 5.46* 3.66*  CALCIUM 8.9 8.8*   PT/INR No results for input(s): LABPROT, INR in the last 72 hours. ABG No results for input(s): PHART, HCO3 in the last 72 hours.  Invalid input(s): PCO2, PO2  Studies/Results: No results found.  Anti-infectives: Anti-infectives (From admission, onward)   Start     Dose/Rate Route Frequency Ordered Stop   05/14/20 2200  doxycycline (VIBRA-TABS) tablet 100 mg        100 mg Oral 2 times daily 05/14/20 1017 05/17/20 2150   05/14/20 1000  doxycycline (VIBRA-TABS) tablet 100 mg  Status:  Discontinued        100 mg Oral 2 times daily 05/13/20 2038 05/14/20 1017   05/14/20 1000  linezolid (ZYVOX) tablet 600 mg  Status:  Discontinued        600 mg Oral 2 times daily 05/13/20 2038 05/14/20 1017   05/13/20 1945  clindamycin (CLEOCIN) IVPB 900 mg        900 mg 100 mL/hr over 30 Minutes Intravenous On call to O.R. 05/13/20 1933 05/14/20 0040      Assessment/Plan: ESRD Hypotension HLD Asthma DM-2 Morbid obesity BMI 49 H/o PE/DVT on eliquis ABL  anemia - Hgb 7.3  Ruptured hematoma -S/p evacuation of hematoma from right leg, cauterization of bleeding areas, closure of 20 cm of wounds 10/4 Dr. Kieth Brightly  Will continue current wound care Again, some skin may not survive WBC down Will check again Monday   LOS: 6 days    Coralie Keens 05/19/2020

## 2020-05-19 NOTE — Progress Notes (Signed)
RT went to check on pt for the 2nd time and she refused the CPAP tonight. RN aware.

## 2020-05-19 NOTE — Progress Notes (Signed)
Patient ID: Suzanne Levy, female   DOB: [birth year], 69 y.o.   MRN: [id number]  PROGRESS NOTE    Suzanne Levy  BTD:[id number] DOB: 04-15-1951 DOA: 05/13/2020 PCP: [name], MD   Brief Narrative:  69 year old female, morbidly obese, with past medical history significant for ESRD on MWF HD, history of PE on Eliquis, insulin-dependent type 2 diabetes, hypertension, hyperlipidemia, morbid obesity and asthma who is wheelchair-bound was admitted with ruptured hematoma of the right lower leg.  She underwent evacuation of the hematoma, cauterization of bleeding areas and closure of the wound by general surgery.  Nephrology is following for dialysis needs.  Assessment & Plan:   Ruptured hematoma of the right lower extremity -Occurred in the setting of anticoagulation use -Status post evacuation of the hematoma, cauterization of the bleeding vessels and closure of the large wound by general surgery -Doxycycline has been discontinued by general surgery -Eliquis still on hold. -Wound care as per general surgery recommendations  Acute blood loss anemia -Due to above.  Status post 2 units packed red cells transfusion.  Hemoglobin 8.3 today.  End-stage renal disease on hemodialysis -Nephrology following.  Hemodialysis as per nephrology schedule and recommendations  Chronic hypotension -Continue midodrine.  Hyperkalemia -Lokelma has been discontinued.  Resolved.  Nephrology following.  History of PE/DVT on Eliquis -Eliquis on hold  Insulin-dependent diabetes mellitus type 2 uncontrolled with hyperglycemia -Blood sugars currently stable.  Continue CBGs with SSI. Continue Lantus.  Leukocytosis -Resolved  Thrombocytopenia -Questionable cause.  Monitor  Morbid obesity/OSA/OHS -Continue CPAP at bedtime.  Left ankle and pannus wounds -Recent admission at [facility] from 04/11/2020-04/21/2020 requiring debridement of right flank and groin wounds on 04/14/2020.  On oral antibiotics as an  outpatient -Wound care team input is appreciated.  General surgery following  Left foot fractures -X-ray of the left ankle is showing bimalleolar fracture.  Orthopedics recommended conservative management with CAM boot and office follow-up.  Generalized deconditioning -Overall prognosis is guarded to poor.  Palliative care following. CODE STATUS changed to DNR. Patient/family hoping that patient will be discharged to [facility]. Social worker aware.  DVT prophylaxis: SCDs Code Status: Full Family Communication: None at bedside Disposition Plan: Status is: Inpatient  Remains inpatient appropriate because:Inpatient level of care appropriate due to severity of illness.  [facility] once bed is available and once cleared by general surgery   Dispo: The patient is from: Home              Anticipated d/c is to: SNF/[facility]              Anticipated d/c date is: 1 day              Patient currently is not medically stable to d/c.  Consultants: General surgery/nephrology.  Orthopedics, palliative care  Procedures: Evacuation of hematoma from right leg, cauterization of bleeding areas, closure of 20 cm of wounds on 05/14/2020 by general surgery  Antimicrobials:  Anti-infectives (From admission, onward)   Start     Dose/Rate Route Frequency Ordered Stop   05/14/20 2200  doxycycline (VIBRA-TABS) tablet 100 mg        100 mg Oral 2 times daily 05/14/20 1017 05/17/20 2150   05/14/20 1000  doxycycline (VIBRA-TABS) tablet 100 mg  Status:  Discontinued        100 mg Oral 2 times daily 05/13/20 2038 05/14/20 1017   05/14/20 1000  linezolid (ZYVOX) tablet 600 mg  Status:  Discontinued        600 mg Oral 2 times daily  05/13/20 2038 05/14/20 1017   05/13/20 1945  clindamycin (CLEOCIN) IVPB 900 mg        900 mg 100 mL/hr over 30 Minutes Intravenous On call to O.R. 05/13/20 1933 05/14/20 0040       Subjective: Patient seen and examined at bedside. Poor historian.  Hardly answers any questions.  Denies  worsening shortness of breath or chest pain.  Complains of right lower extremity pain. Objective: Vitals:   05/18/20 2100 05/18/20 2256 05/18/20 2300 05/19/20 0326  BP: (!) 146/105 112/87 129/75 (!) 142/70  Pulse: 94 97 97 90  Resp: 17 17 16 19   Temp:  98.5 F (36.9 C)  98.5 F (36.9 C)  TempSrc:  Oral  Oral  SpO2: 96% 94% 93% 94%  Weight:      Height:        Intake/Output Summary (Last 24 hours) at 05/19/2020 0736 Last data filed at 05/18/2020 1800 Gross per 24 hour  Intake 530 ml  Output -225 ml  Net 755 ml   Filed Weights   05/18/20 0340  Weight: (!) 194.6 kg    Examination:  General exam: No distress.  Chronically ill looking.  Looks older than stated age. Very poor historian. Respiratory system: Bilateral decreased breath sounds at bases with scattered crackles  cardiovascular system: S1-S2 heard, rate controlled  gastrointestinal system: Abdomen is morbidly obese, distended, soft and nontender.  Normal bowel sounds are heard Extremities: Dressing over the right thigh present.  No clubbing.  Trace lower extremity edema present  Central nervous system: Awake, hardly participated in conversation.  No focal neurological deficits. Moving extremities.  Skin: No other ecchymosis  psychiatry: Flat affect.  Data Reviewed: I have personally reviewed following labs and imaging studies  CBC: Recent Labs  Lab 05/15/20 0039 05/16/20 0045 05/17/20 0034 05/18/20 0013 05/19/20 0052  WBC 14.5* 12.4* 12.8* 12.4* 10.6*  NEUTROABS 12.6* 10.2* 10.8* 10.3* 9.5*  HGB 7.7* 7.8* 7.3* 7.8* 8.3*  HCT 24.6* 25.7* 24.2* 25.0* 27.3*  MCV 93.5 94.8 96.4 98.0 98.9  PLT 133* 121* 121* 127* 381*   Basic Metabolic Panel: Recent Labs  Lab 05/15/20 0039 05/16/20 0045 05/17/20 0034 05/18/20 0013 05/19/20 0052  NA 133* 129* 132* 130* 135  K 4.8 4.1 3.4* 3.5 4.0  CL 92* 89* 93* 92* 95*  CO2 22 23 25 23 25   GLUCOSE 198* 225* 237* 183* 149*  BUN 30* 45* 31* 39* 21  CREATININE 4.55*  6.01* 4.43* 5.46* 3.66*  CALCIUM 8.4* 8.8* 8.7* 8.9 8.8*  MG 2.0  --   --   --   --   PHOS 5.1*  --   --   --   --    GFR: Estimated Creatinine Clearance: 27.2 mL/min (A) (by C-G formula based on SCr of 3.66 mg/dL (H)). Liver Function Tests: Recent Labs  Lab 05/15/20 0039  ALBUMIN 2.3*   No results for input(s): LIPASE, AMYLASE in the last 168 hours. No results for input(s): AMMONIA in the last 168 hours. Coagulation Profile: Recent Labs  Lab 05/13/20 1718  INR 1.7*   Cardiac Enzymes: No results for input(s): CKTOTAL, CKMB, CKMBINDEX, TROPONINI in the last 168 hours. BNP (last 3 results) No results for input(s): PROBNP in the last 8760 hours. HbA1C: No results for input(s): HGBA1C in the last 72 hours. CBG: Recent Labs  Lab 05/18/20 1040 05/18/20 1807 05/18/20 1932 05/18/20 2255 05/19/20 0325  GLUCAP 201* 149* 134* 170* 135*   Lipid Profile: No results for input(s): CHOL,  HDL, LDLCALC, TRIG, CHOLHDL, LDLDIRECT in the last 72 hours. Thyroid Function Tests: No results for input(s): TSH, T4TOTAL, FREET4, T3FREE, THYROIDAB in the last 72 hours. Anemia Panel: No results for input(s): VITAMINB12, FOLATE, FERRITIN, TIBC, IRON, RETICCTPCT in the last 72 hours. Sepsis Labs: No results for input(s): PROCALCITON, LATICACIDVEN in the last 168 hours.  Recent Results (from the past 240 hour(s))  Respiratory Panel by RT PCR (Flu A&B, Covid) - Nasopharyngeal Swab     Status: None   Collection Time: 05/13/20  5:07 PM   Specimen: Nasopharyngeal Swab  Result Value Ref Range Status   SARS Coronavirus 2 by RT PCR NEGATIVE NEGATIVE Final    Comment: (NOTE) SARS-CoV-2 target nucleic acids are NOT DETECTED.  The SARS-CoV-2 RNA is generally detectable in upper respiratoy specimens during the acute phase of infection. The lowest concentration of SARS-CoV-2 viral copies this assay can detect is 131 copies/mL. A negative result does not preclude SARS-Cov-2 infection and should not be  used as the sole basis for treatment or other patient management decisions. A negative result may occur with  improper specimen collection/handling, submission of specimen other than nasopharyngeal swab, presence of viral mutation(s) within the areas targeted by this assay, and inadequate number of viral copies (<131 copies/mL). A negative result must be combined with clinical observations, patient history, and epidemiological information. The expected result is Negative.  Fact Sheet for Patients:  PinkCheek.be  Fact Sheet for Healthcare Providers:  GravelBags.it  This test is no t yet approved or cleared by the [place] FDA and  has been authorized for detection and/or diagnosis of SARS-CoV-2 by FDA under an Emergency Use Authorization (EUA). This EUA will remain  in effect (meaning this test can be used) for the duration of the COVID-19 declaration under Section 564(b)(1) of the Act, 21 U.S.C. section 360bbb-3(b)(1), unless the authorization is terminated or revoked sooner.     Influenza A by PCR NEGATIVE NEGATIVE Final   Influenza B by PCR NEGATIVE NEGATIVE Final    Comment: (NOTE) The Xpert Xpress SARS-CoV-2/FLU/RSV assay is intended as an aid in  the diagnosis of influenza from Nasopharyngeal swab specimens and  should not be used as a sole basis for treatment. Nasal washings and  aspirates are unacceptable for Xpert Xpress SARS-CoV-2/FLU/RSV  testing.  Fact Sheet for Patients: PinkCheek.be  Fact Sheet for Healthcare Providers: GravelBags.it  This test is not yet approved or cleared by the [place] FDA and  has been authorized for detection and/or diagnosis of SARS-CoV-2 by  FDA under an Emergency Use Authorization (EUA). This EUA will remain  in effect (meaning this test can be used) for the duration of the  Covid-19 declaration under Section  564(b)(1) of the Act, 21  U.S.C. section 360bbb-3(b)(1), unless the authorization is  terminated or revoked. Performed at [facility], [place] [address] [place], [place] [address]          Radiology Studies: No results found.      Scheduled Meds: . Chlorhexidine Gluconate Cloth  6 each Topical Q0600  . collagenase   Topical Daily  . doxercalciferol  2 mcg Intravenous Q M,W,F-HD  . insulin aspart  0-6 Units Subcutaneous Q4H  . insulin detemir  20 Units Subcutaneous Daily  . midodrine  10 mg Oral TID WC  . montelukast  10 mg Oral Daily  . pravastatin  40 mg Oral Daily   Continuous Infusions:        Aline August, MD [facility] 05/19/2020,  7:36 AM

## 2020-05-20 DIAGNOSIS — S8011XA Contusion of right lower leg, initial encounter: Secondary | ICD-10-CM | POA: Diagnosis not present

## 2020-05-20 DIAGNOSIS — N186 End stage renal disease: Secondary | ICD-10-CM | POA: Diagnosis not present

## 2020-05-20 DIAGNOSIS — S82842A Displaced bimalleolar fracture of left lower leg, initial encounter for closed fracture: Secondary | ICD-10-CM | POA: Diagnosis not present

## 2020-05-20 DIAGNOSIS — D62 Acute posthemorrhagic anemia: Secondary | ICD-10-CM | POA: Diagnosis not present

## 2020-05-20 LAB — GLUCOSE, CAPILLARY
Glucose-Capillary: 158 mg/dL — ABNORMAL HIGH (ref 70–99)
Glucose-Capillary: 156 mg/dL — ABNORMAL HIGH (ref 70–99)
Glucose-Capillary: 173 mg/dL — ABNORMAL HIGH (ref 70–99)
Glucose-Capillary: 232 mg/dL — ABNORMAL HIGH (ref 70–99)
Glucose-Capillary: 251 mg/dL — ABNORMAL HIGH (ref 70–99)

## 2020-05-20 NOTE — Progress Notes (Signed)
[facility] Progress Note  Subjective: seen in room , no new c/o's  Vitals:   05/20/20 0300 05/20/20 0400 05/20/20 0718 05/20/20 1051  BP: (!) 91/42 (!) 76/34 (!) 68/42 (!) 92/42  Pulse: 84 86 94 92  Resp: 20 19 20 16   Temp: 98.6 F (37 C)  97.7 F (36.5 C) 97.7 F (36.5 C)  TempSrc: Oral  Oral Oral  SpO2: 98% 98% 100% 100%  Weight:      Height:        Exam: General: Obese female, nad  Lungs: Clear, anteriorly, diminished breath sounds.  Breathing is unlabored. Heart: RRR with S1 S2 Abdomen: soft obese, non-tender  Lower extremities: R leg in bandage;mild-mod bilat pitting LE edema  Neuro: A & O  X 3. Moves all extremities spontaneously. Dialysis Access: LUE AVG +bruit; some bruising to LUE     OP HD:  AF MWF 4h 73min 500/800   156kg  2K/2.25Ca   L AVG   Heparin none Hectorol 2  Venofer 50 q week Mircera 225 (last 9/27)   Assessment/Plan: 1. Traumatic hematoma of RLE - s/p surgical evacuation 10/4. Being f/b gen surgery. Lots of leg pain. Appreciate gen surg assistance.  2. ESRD -  HD MWF. Next HD monday.  No heparin  3. Volume- Chronic hypotension on midodrine. Significant LE edema unable to pull volume due to BP drops on HD. Midodrine started here at 10 mg tid.   4. Anemia  - Post op blood loss noted. S/p 2 u prbcs 10/3.  Recent ESA dose as outpatient. Hb 7- 8 range here.  5. Metabolic bone disease -  Ca/phos ok.  Continue binders/Hectorol.  6. Nutrition - Renal diet/vitalmins 7. Hx PE - Holding Eliquis  8. H/o blindness 9. IDDM - per primary 10. Morbid obesity/ debility / WC dependent  11. Prognosis - pt is now DNR, appreciate [facility] and palliative assistance. LTAC is one option, awaiting response. OP HD would not be possible w/ her low BP's and immobility. Prognosis appears quite poor. Have d/w pmd.    Kelly Splinter  MD 05/20/2020, 1:14 PM     Recent Labs  Lab 05/15/20 0039 05/16/20 0045 05/18/20 0013 05/19/20 0052  K 4.8   < > 3.5 4.0   BUN 30*   < > 39* 21  CREATININE 4.55*   < > 5.46* 3.66*  CALCIUM 8.4*   < > 8.9 8.8*  PHOS 5.1*  --   --   --   HGB 7.7*   < > 7.8* 8.3*   < > = values in this interval not displayed.   Inpatient medications: . Chlorhexidine Gluconate Cloth  6 each Topical Q0600  . collagenase   Topical Daily  . doxercalciferol  2 mcg Intravenous Q M,W,F-HD  . insulin aspart  0-6 Units Subcutaneous Q4H  . insulin detemir  20 Units Subcutaneous Daily  . midodrine  10 mg Oral TID WC  . montelukast  10 mg Oral Daily  . pravastatin  40 mg Oral Daily    acetaminophen **OR** acetaminophen, albuterol, hydrOXYzine, ondansetron **OR** ondansetron (ZOFRAN) IV, oxyCODONE, traMADol

## 2020-05-20 NOTE — Progress Notes (Signed)
Patient ID: Suzanne Levy, female   DOB: [birth year], 69 y.o.   MRN: [id number]  PROGRESS NOTE    Suzanne Levy  EHU:[id number] DOB: [birth year] DOA: 05/13/2020 PCP: [name], MD   Brief Narrative:  69 year old female, morbidly obese, with past medical history significant for ESRD on MWF HD, history of PE on Eliquis, insulin-dependent type 2 diabetes, hypertension, hyperlipidemia, morbid obesity and asthma who is wheelchair-bound was admitted with ruptured hematoma of the right lower leg.  She underwent evacuation of the hematoma, cauterization of bleeding areas and closure of the wound by general surgery.  Nephrology is following for dialysis needs.  Assessment & Plan:   Ruptured hematoma of the right lower extremity -Occurred in the setting of anticoagulation use -Status post evacuation of the hematoma, cauterization of the bleeding vessels and closure of the large wound by general surgery -Doxycycline has been discontinued by general surgery -Eliquis still on hold. -Wound care as per general surgery recommendations  Acute blood loss anemia -Due to above.  Status post 2 units packed red cells transfusion.  Hemoglobin pending today.  End-stage renal disease on hemodialysis -Nephrology following.  Hemodialysis as per nephrology schedule and recommendations  Chronic hypotension -Continue midodrine. Blood pressure in the 70s and 60s overnight.  Hyperkalemia -Lokelma has been discontinued.  Resolved.  Nephrology following.  History of PE/DVT on Eliquis -Eliquis on hold  Insulin-dependent diabetes mellitus type 2 uncontrolled with hyperglycemia -Blood sugars currently stable.  Continue CBGs with SSI. Continue Lantus.  Leukocytosis -Resolved  Thrombocytopenia -Questionable cause.  Monitor  Morbid obesity/OSA/OHS -Continue CPAP at bedtime.  Left ankle and pannus wounds -Recent admission at [facility] from 04/11/2020-04/21/2020 requiring debridement of right flank and groin wounds  on 04/14/2020.  On oral antibiotics as an outpatient -Wound care team input is appreciated.  General surgery following  Left foot fractures -X-ray of the left ankle is showing bimalleolar fracture.  Orthopedics recommended conservative management with CAM boot and office follow-up.  Generalized deconditioning -Overall prognosis is guarded to poor.  Palliative care following. CODE STATUS changed to DNR. Patient/family hoping that patient will be discharged to [facility]. Social worker aware.  DVT prophylaxis: SCDs Code Status: Full Family Communication: None at bedside Disposition Plan: Status is: Inpatient  Remains inpatient appropriate because:Inpatient level of care appropriate due to severity of illness.  [facility] once bed is available and once cleared by general surgery   Dispo: The patient is from: Home              Anticipated d/c is to: SNF/[facility]              Anticipated d/c date is: 1 day              Patient currently is not medically stable to d/c.  Consultants: General surgery/nephrology.  Orthopedics, palliative care  Procedures: Evacuation of hematoma from right leg, cauterization of bleeding areas, closure of 20 cm of wounds on 05/14/2020 by general surgery  Antimicrobials:  Anti-infectives (From admission, onward)   Start     Dose/Rate Route Frequency Ordered Stop   05/14/20 2200  doxycycline (VIBRA-TABS) tablet 100 mg        100 mg Oral 2 times daily 05/14/20 1017 05/17/20 2150   05/14/20 1000  doxycycline (VIBRA-TABS) tablet 100 mg  Status:  Discontinued        100 mg Oral 2 times daily 05/13/20 2038 05/14/20 1017   05/14/20 1000  linezolid (ZYVOX) tablet 600 mg  Status:  Discontinued  600 mg Oral 2 times daily 05/13/20 2038 05/14/20 1017   05/13/20 1945  clindamycin (CLEOCIN) IVPB 900 mg        900 mg 100 mL/hr over 30 Minutes Intravenous On call to O.R. 05/13/20 1933 05/14/20 0040       Subjective: Patient seen and examined at bedside. Poor historian.   Does not participate in conversation much.  No overnight fever, vomiting, worsening shortness of breath reported.   Objective: Vitals:   05/19/20 2346 05/20/20 0300 05/20/20 0400 05/20/20 0718  BP: (!) 80/65 (!) 91/42 (!) 76/34 (!) 68/42  Pulse: 89 84 86 94  Resp: 16 20 19 20   Temp:  98.6 F (37 C)  97.7 F (36.5 C)  TempSrc:  Oral  Oral  SpO2: 96% 98% 98% 100%  Weight:      Height:        Intake/Output Summary (Last 24 hours) at 05/20/2020 0736 Last data filed at 05/19/2020 1600 Gross per 24 hour  Intake 640 ml  Output --  Net 640 ml   Filed Weights   05/18/20 0340  Weight: (!) 194.6 kg    Examination:  General exam: No acute distress.  Looks chronically ill.  Looks older than stated age. Very poor historian. Respiratory system: Bilateral decreased breath sounds at bases some crackles.  No wheezing  cardiovascular system: Rate controlled, S1-S2 heard  gastrointestinal system: Abdomen is morbidly obese, distended, soft and nontender.  Bowel sounds heard Extremities: Right lower extremity dressing present.  No cyanosis.  Bilateral lower extremity edema present  Central nervous system: Sleepy, wakes up slightly, hardly participates in any conversation.  No focal neurological deficits.  Moves extremities Skin: No other petechiae  psychiatry: Affect is very flat  Data Reviewed: I have personally reviewed following labs and imaging studies  CBC: Recent Labs  Lab 05/15/20 0039 05/16/20 0045 05/17/20 0034 05/18/20 0013 05/19/20 0052  WBC 14.5* 12.4* 12.8* 12.4* 10.6*  NEUTROABS 12.6* 10.2* 10.8* 10.3* 9.5*  HGB 7.7* 7.8* 7.3* 7.8* 8.3*  HCT 24.6* 25.7* 24.2* 25.0* 27.3*  MCV 93.5 94.8 96.4 98.0 98.9  PLT 133* 121* 121* 127* 854*   Basic Metabolic Panel: Recent Labs  Lab 05/15/20 0039 05/16/20 0045 05/17/20 0034 05/18/20 0013 05/19/20 0052  NA 133* 129* 132* 130* 135  K 4.8 4.1 3.4* 3.5 4.0  CL 92* 89* 93* 92* 95*  CO2 22 23 25 23 25   GLUCOSE 198* 225* 237*  183* 149*  BUN 30* 45* 31* 39* 21  CREATININE 4.55* 6.01* 4.43* 5.46* 3.66*  CALCIUM 8.4* 8.8* 8.7* 8.9 8.8*  MG 2.0  --   --   --   --   PHOS 5.1*  --   --   --   --    GFR: Estimated Creatinine Clearance: 27.2 mL/min (A) (by C-G formula based on SCr of 3.66 mg/dL (H)). Liver Function Tests: Recent Labs  Lab 05/15/20 0039  ALBUMIN 2.3*   No results for input(s): LIPASE, AMYLASE in the last 168 hours. No results for input(s): AMMONIA in the last 168 hours. Coagulation Profile: Recent Labs  Lab 05/13/20 1718  INR 1.7*   Cardiac Enzymes: No results for input(s): CKTOTAL, CKMB, CKMBINDEX, TROPONINI in the last 168 hours. BNP (last 3 results) No results for input(s): PROBNP in the last 8760 hours. HbA1C: No results for input(s): HGBA1C in the last 72 hours. CBG: Recent Labs  Lab 05/19/20 1150 05/19/20 1618 05/19/20 2025 05/19/20 2355 05/20/20 0448  GLUCAP 171* 181* 190*  187* 158*   Lipid Profile: No results for input(s): CHOL, HDL, LDLCALC, TRIG, CHOLHDL, LDLDIRECT in the last 72 hours. Thyroid Function Tests: No results for input(s): TSH, T4TOTAL, FREET4, T3FREE, THYROIDAB in the last 72 hours. Anemia Panel: No results for input(s): VITAMINB12, FOLATE, FERRITIN, TIBC, IRON, RETICCTPCT in the last 72 hours. Sepsis Labs: No results for input(s): PROCALCITON, LATICACIDVEN in the last 168 hours.  Recent Results (from the past 240 hour(s))  Respiratory Panel by RT PCR (Flu A&B, Covid) - Nasopharyngeal Swab     Status: None   Collection Time: 05/13/20  5:07 PM   Specimen: Nasopharyngeal Swab  Result Value Ref Range Status   SARS Coronavirus 2 by RT PCR NEGATIVE NEGATIVE Final    Comment: (NOTE) SARS-CoV-2 target nucleic acids are NOT DETECTED.  The SARS-CoV-2 RNA is generally detectable in upper respiratoy specimens during the acute phase of infection. The lowest concentration of SARS-CoV-2 viral copies this assay can detect is 131 copies/mL. A negative result does  not preclude SARS-Cov-2 infection and should not be used as the sole basis for treatment or other patient management decisions. A negative result may occur with  improper specimen collection/handling, submission of specimen other than nasopharyngeal swab, presence of viral mutation(s) within the areas targeted by this assay, and inadequate number of viral copies (<131 copies/mL). A negative result must be combined with clinical observations, patient history, and epidemiological information. The expected result is Negative.  Fact Sheet for Patients:  PinkCheek.be  Fact Sheet for Healthcare Providers:  GravelBags.it  This test is no t yet approved or cleared by the [place] FDA and  has been authorized for detection and/or diagnosis of SARS-CoV-2 by FDA under an Emergency Use Authorization (EUA). This EUA will remain  in effect (meaning this test can be used) for the duration of the COVID-19 declaration under Section 564(b)(1) of the Act, 21 U.S.C. section 360bbb-3(b)(1), unless the authorization is terminated or revoked sooner.     Influenza A by PCR NEGATIVE NEGATIVE Final   Influenza B by PCR NEGATIVE NEGATIVE Final    Comment: (NOTE) The Xpert Xpress SARS-CoV-2/FLU/RSV assay is intended as an aid in  the diagnosis of influenza from Nasopharyngeal swab specimens and  should not be used as a sole basis for treatment. Nasal washings and  aspirates are unacceptable for Xpert Xpress SARS-CoV-2/FLU/RSV  testing.  Fact Sheet for Patients: PinkCheek.be  Fact Sheet for Healthcare Providers: GravelBags.it  This test is not yet approved or cleared by the [place] FDA and  has been authorized for detection and/or diagnosis of SARS-CoV-2 by  FDA under an Emergency Use Authorization (EUA). This EUA will remain  in effect (meaning this test can be used) for the  duration of the  Covid-19 declaration under Section 564(b)(1) of the Act, 21  U.S.C. section 360bbb-3(b)(1), unless the authorization is  terminated or revoked. Performed at [facility], [place] [address], [place], [place] [address]          Radiology Studies: No results found.      Scheduled Meds: . Chlorhexidine Gluconate Cloth  6 each Topical Q0600  . collagenase   Topical Daily  . doxercalciferol  2 mcg Intravenous Q M,W,F-HD  . insulin aspart  0-6 Units Subcutaneous Q4H  . insulin detemir  20 Units Subcutaneous Daily  . midodrine  10 mg Oral TID WC  . montelukast  10 mg Oral Daily  . pravastatin  40 mg Oral Daily   Continuous Infusions:  Aline August, MD [facility] 05/20/2020, 7:36 AM

## 2020-05-20 NOTE — Progress Notes (Signed)
Ultram given prior to changing all dressings. Needed 4 person assist for dressing changed.Complaining of pain on right leg between turning.

## 2020-05-20 NOTE — Progress Notes (Signed)
Blood pressure on 70' systolic, asymptomatic claimed to be chronic. Gave order  Ok to give pain meds  with low BP.

## 2020-05-20 NOTE — Progress Notes (Signed)
Pt refused CPAP. Told pt to let the nurse know if she changes her mind.

## 2020-05-21 DIAGNOSIS — S82842A Displaced bimalleolar fracture of left lower leg, initial encounter for closed fracture: Secondary | ICD-10-CM | POA: Diagnosis not present

## 2020-05-21 DIAGNOSIS — I9589 Other hypotension: Secondary | ICD-10-CM

## 2020-05-21 DIAGNOSIS — S8011XD Contusion of right lower leg, subsequent encounter: Secondary | ICD-10-CM | POA: Diagnosis not present

## 2020-05-21 DIAGNOSIS — S8011XA Contusion of right lower leg, initial encounter: Secondary | ICD-10-CM | POA: Diagnosis not present

## 2020-05-21 DIAGNOSIS — D62 Acute posthemorrhagic anemia: Secondary | ICD-10-CM | POA: Diagnosis not present

## 2020-05-21 DIAGNOSIS — N186 End stage renal disease: Secondary | ICD-10-CM | POA: Diagnosis not present

## 2020-05-21 LAB — BASIC METABOLIC PANEL
Anion gap: 15 (ref 5–15)
BUN: 43 mg/dL — ABNORMAL HIGH (ref 8–23)
CO2: 22 mmol/L (ref 22–32)
Calcium: 9.1 mg/dL (ref 8.9–10.3)
Chloride: 93 mmol/L — ABNORMAL LOW (ref 98–111)
Creatinine, Ser: 5.88 mg/dL — ABNORMAL HIGH (ref 0.44–1.00)
GFR, Estimated: 7 mL/min — ABNORMAL LOW (ref >60)
Glucose, Bld: 227 mg/dL — ABNORMAL HIGH (ref 70–99)
Potassium: 4.9 mmol/L (ref 3.5–5.1)
Sodium: 130 mmol/L — ABNORMAL LOW (ref 135–145)

## 2020-05-21 LAB — GLUCOSE, CAPILLARY
Glucose-Capillary: 135 mg/dL — ABNORMAL HIGH (ref 70–99)
Glucose-Capillary: 161 mg/dL — ABNORMAL HIGH (ref 70–99)
Glucose-Capillary: 167 mg/dL — ABNORMAL HIGH (ref 70–99)
Glucose-Capillary: 169 mg/dL — ABNORMAL HIGH (ref 70–99)
Glucose-Capillary: 187 mg/dL — ABNORMAL HIGH (ref 70–99)
Glucose-Capillary: 192 mg/dL — ABNORMAL HIGH (ref 70–99)
Glucose-Capillary: 240 mg/dL — ABNORMAL HIGH (ref 70–99)

## 2020-05-21 LAB — CBC WITH DIFFERENTIAL/PLATELET
Abs Immature Granulocytes: 0.07 10*3/uL (ref 0.00–0.07)
Basophils Absolute: 0 10*3/uL (ref 0.0–0.1)
Basophils Relative: 0 %
Eosinophils Absolute: 0.1 10*3/uL (ref 0.0–0.5)
Eosinophils Relative: 1 %
HCT: 26.9 % — ABNORMAL LOW (ref 36.0–46.0)
Hemoglobin: 8.2 g/dL — ABNORMAL LOW (ref 12.0–15.0)
Immature Granulocytes: 1 %
Lymphocytes Relative: 3 %
Lymphs Abs: 0.3 10*3/uL — ABNORMAL LOW (ref 0.7–4.0)
MCH: 29.8 pg (ref 26.0–34.0)
MCHC: 30.5 g/dL (ref 30.0–36.0)
MCV: 97.8 fL (ref 80.0–100.0)
Monocytes Absolute: 0.6 10*3/uL (ref 0.1–1.0)
Monocytes Relative: 6 %
Neutro Abs: 9.5 10*3/uL — ABNORMAL HIGH (ref 1.7–7.7)
Neutrophils Relative %: 89 %
Platelets: 173 10*3/uL (ref 150–400)
RBC: 2.75 MIL/uL — ABNORMAL LOW (ref 3.87–5.11)
RDW: 23.6 % — ABNORMAL HIGH (ref 11.5–15.5)
WBC: 10.6 10*3/uL — ABNORMAL HIGH (ref 4.0–10.5)
nRBC: 0 % (ref 0.0–0.2)

## 2020-05-21 MED ORDER — ACETAMINOPHEN 325 MG PO TABS
ORAL_TABLET | ORAL | Status: AC
  Filled 2020-05-21: qty 2

## 2020-05-21 MED ORDER — DOXERCALCIFEROL 4 MCG/2ML IV SOLN
INTRAVENOUS | Status: AC
  Administered 2020-05-21: 2 ug via INTRAVENOUS
  Filled 2020-05-21: qty 2

## 2020-05-21 MED ORDER — MIDODRINE HCL 5 MG PO TABS
ORAL_TABLET | ORAL | Status: AC
  Filled 2020-05-21: qty 2

## 2020-05-21 NOTE — Progress Notes (Signed)
Patient ID: Suzanne Levy, female   DOB: 1951-05-26, 69 y.o.   MRN: [id number]  PROGRESS NOTE    Lilya Smitherman  GLO:[id number] DOB: Aug 13, 1950 DOA: 05/13/2020 PCP: [name], MD   Brief Narrative:  69 year old female, morbidly obese, with past medical history significant for ESRD on MWF HD, history of PE on Eliquis, insulin-dependent type 2 diabetes, hypertension, hyperlipidemia, morbid obesity and asthma who is wheelchair-bound was admitted with ruptured hematoma of the right lower leg.  She underwent evacuation of the hematoma, cauterization of bleeding areas and closure of the wound by general surgery.  Nephrology is following for dialysis needs.  Assessment & Plan:   Ruptured hematoma of the right lower extremity -Occurred in the setting of anticoagulation use -Status post evacuation of the hematoma, cauterization of the bleeding vessels and closure of the large wound by general surgery -Doxycycline has been discontinued by general surgery -Eliquis still on hold. -Wound care as per general surgery recommendations  Acute blood loss anemia -Due to above.  Status post 2 units packed red cells transfusion.  Hemoglobin 8.2 today.  End-stage renal disease on hemodialysis -Nephrology following.  Hemodialysis as per nephrology schedule and recommendations.  Outpatient hemodialysis would not be possible with her low blood pressure and immobility as per nephrology.  Chronic hypotension -Continue midodrine. Blood pressure still extremely low overnight.  Hyperkalemia -Lokelma has been discontinued.  Resolved.  Nephrology following.  History of PE/DVT on Eliquis -Eliquis on hold  Insulin-dependent diabetes mellitus type 2 uncontrolled with hyperglycemia -Blood sugars currently intermittently elevated.  Continue CBGs with SSI.  Increase Lantus to 25 units daily.  Leukocytosis -Resolved  Thrombocytopenia -Questionable cause.  Monitor  Morbid obesity/OSA/OHS -Continue CPAP at  bedtime.  Left ankle and pannus wounds -Recent admission at [facility] from 04/11/2020-04/21/2020 requiring debridement of right flank and groin wounds on 04/14/2020.  On oral antibiotics as an outpatient -Wound care team input is appreciated.  General surgery following  Left foot fractures -X-ray of the left ankle is showing bimalleolar fracture.  Orthopedics recommended conservative management with CAM boot and office follow-up.  Generalized deconditioning -Overall prognosis is guarded to poor.  Palliative care following. CODE STATUS changed to DNR. Patient/family hoping that patient will be discharged to [facility]. Social worker aware. -After discussion with nephrology/Dr. Jonnie Finner on 05/20/2020, I have asked palliative care to have further discussion with patient and family regarding patient's overall poor prognosis and probably consideration of hospice.  DVT prophylaxis: SCDs Code Status: Full Family Communication: None at bedside Disposition Plan: Status is: Inpatient  Remains inpatient appropriate because:Inpatient level of care appropriate due to severity of illness.  [facility] once bed is available and once cleared by general surgery   Dispo: The patient is from: Home              Anticipated d/c is to: SNF/[facility]              Anticipated d/c date is: 1 day              Patient currently is not medically stable to d/c.  Consultants: General surgery/nephrology.  Orthopedics, palliative care  Procedures: Evacuation of hematoma from right leg, cauterization of bleeding areas, closure of 20 cm of wounds on 05/14/2020 by general surgery  Antimicrobials:  Anti-infectives (From admission, onward)   Start     Dose/Rate Route Frequency Ordered Stop   05/14/20 2200  doxycycline (VIBRA-TABS) tablet 100 mg        100 mg Oral 2 times daily 05/14/20 1017 05/17/20  2150   05/14/20 1000  doxycycline (VIBRA-TABS) tablet 100 mg  Status:  Discontinued        100 mg Oral 2 times daily 05/13/20 2038 05/14/20  1017   05/14/20 1000  linezolid (ZYVOX) tablet 600 mg  Status:  Discontinued        600 mg Oral 2 times daily 05/13/20 2038 05/14/20 1017   05/13/20 1945  clindamycin (CLEOCIN) IVPB 900 mg        900 mg 100 mL/hr over 30 Minutes Intravenous On call to O.R. 05/13/20 1933 05/14/20 0040       Subjective: Patient seen and examined at bedside undergoing hemodialysis.  Extremely poor historian.  Sleepy, wakes up only very slightly.  No worsening shortness of breath, fever reported.  Does not participate in conversation much. Objective: Vitals:   05/20/20 2026 05/20/20 2300 05/21/20 0300 05/21/20 0356  BP:  93/71 (!) 68/49 (!) 86/74  Pulse: 89 88 94   Resp: 15 19 18    Temp:  98.8 F (37.1 C)  98.6 F (37 C)  TempSrc:  Oral  Oral  SpO2: 100% 99% 98%   Weight:      Height:        Intake/Output Summary (Last 24 hours) at 05/21/2020 0746 Last data filed at 05/20/2020 1700 Gross per 24 hour  Intake 320 ml  Output --  Net 320 ml   Filed Weights   05/18/20 0340  Weight: (!) 194.6 kg    Examination:  General exam: No distress.  Looks chronically ill.  Looks older than stated age.  Extremely poor historian.   Respiratory system: Bilateral decreased breath sounds at bases with scattered crackles.   Cardiovascular system: S1-S2 heard, rate controlled gastrointestinal system: Abdomen is morbidly obese, distended, soft and nontender.  Normal bowel sounds are heard Extremities: Dressing present in the right lower extremity.  No cyanosis.  Lower extremity edema present bilaterally  Central nervous system: Poor historian.  Hardly participates in any conversation.  No focal neurological deficits.  Moving extremities Skin: No other ecchymosis  psychiatry: Extremely flat affect  Data Reviewed: I have personally reviewed following labs and imaging studies  CBC: Recent Labs  Lab 05/16/20 0045 05/17/20 0034 05/18/20 0013 05/19/20 0052 05/21/20 0123  WBC 12.4* 12.8* 12.4* 10.6* 10.6*   NEUTROABS 10.2* 10.8* 10.3* 9.5* 9.5*  HGB 7.8* 7.3* 7.8* 8.3* 8.2*  HCT 25.7* 24.2* 25.0* 27.3* 26.9*  MCV 94.8 96.4 98.0 98.9 97.8  PLT 121* 121* 127* 130* 536   Basic Metabolic Panel: Recent Labs  Lab 05/15/20 0039 05/15/20 0039 05/16/20 0045 05/17/20 0034 05/18/20 0013 05/19/20 0052 05/21/20 0123  NA 133*   < > 129* 132* 130* 135 130*  K 4.8   < > 4.1 3.4* 3.5 4.0 4.9  CL 92*   < > 89* 93* 92* 95* 93*  CO2 22   < > 23 25 23 25 22   GLUCOSE 198*   < > 225* 237* 183* 149* 227*  BUN 30*   < > 45* 31* 39* 21 43*  CREATININE 4.55*   < > 6.01* 4.43* 5.46* 3.66* 5.88*  CALCIUM 8.4*   < > 8.8* 8.7* 8.9 8.8* 9.1  MG 2.0  --   --   --   --   --   --   PHOS 5.1*  --   --   --   --   --   --    < > = values in this interval not displayed.  GFR: Estimated Creatinine Clearance: 16.9 mL/min (A) (by C-G formula based on SCr of 5.88 mg/dL (H)). Liver Function Tests: Recent Labs  Lab 05/15/20 0039  ALBUMIN 2.3*   No results for input(s): LIPASE, AMYLASE in the last 168 hours. No results for input(s): AMMONIA in the last 168 hours. Coagulation Profile: No results for input(s): INR, PROTIME in the last 168 hours. Cardiac Enzymes: No results for input(s): CKTOTAL, CKMB, CKMBINDEX, TROPONINI in the last 168 hours. BNP (last 3 results) No results for input(s): PROBNP in the last 8760 hours. HbA1C: No results for input(s): HGBA1C in the last 72 hours. CBG: Recent Labs  Lab 05/20/20 1654 05/20/20 2047 05/21/20 0040 05/21/20 0428 05/21/20 0622  GLUCAP 232* 251* 240* 192* 187*   Lipid Profile: No results for input(s): CHOL, HDL, LDLCALC, TRIG, CHOLHDL, LDLDIRECT in the last 72 hours. Thyroid Function Tests: No results for input(s): TSH, T4TOTAL, FREET4, T3FREE, THYROIDAB in the last 72 hours. Anemia Panel: No results for input(s): VITAMINB12, FOLATE, FERRITIN, TIBC, IRON, RETICCTPCT in the last 72 hours. Sepsis Labs: No results for input(s): PROCALCITON, LATICACIDVEN in the  last 168 hours.  Recent Results (from the past 240 hour(s))  Respiratory Panel by RT PCR (Flu A&B, Covid) - Nasopharyngeal Swab     Status: None   Collection Time: 05/13/20  5:07 PM   Specimen: Nasopharyngeal Swab  Result Value Ref Range Status   SARS Coronavirus 2 by RT PCR NEGATIVE NEGATIVE Final    Comment: (NOTE) SARS-CoV-2 target nucleic acids are NOT DETECTED.  The SARS-CoV-2 RNA is generally detectable in upper respiratoy specimens during the acute phase of infection. The lowest concentration of SARS-CoV-2 viral copies this assay can detect is 131 copies/mL. A negative result does not preclude SARS-Cov-2 infection and should not be used as the sole basis for treatment or other patient management decisions. A negative result may occur with  improper specimen collection/handling, submission of specimen other than nasopharyngeal swab, presence of viral mutation(s) within the areas targeted by this assay, and inadequate number of viral copies (<131 copies/mL). A negative result must be combined with clinical observations, patient history, and epidemiological information. The expected result is Negative.  Fact Sheet for Patients:  PinkCheek.be  Fact Sheet for Healthcare Providers:  GravelBags.it  This test is no t yet approved or cleared by the [place] FDA and  has been authorized for detection and/or diagnosis of SARS-CoV-2 by FDA under an Emergency Use Authorization (EUA). This EUA will remain  in effect (meaning this test can be used) for the duration of the COVID-19 declaration under Section 564(b)(1) of the Act, 21 U.S.C. section 360bbb-3(b)(1), unless the authorization is terminated or revoked sooner.     Influenza A by PCR NEGATIVE NEGATIVE Final   Influenza B by PCR NEGATIVE NEGATIVE Final    Comment: (NOTE) The Xpert Xpress SARS-CoV-2/FLU/RSV assay is intended as an aid in  the diagnosis of influenza  from Nasopharyngeal swab specimens and  should not be used as a sole basis for treatment. Nasal washings and  aspirates are unacceptable for Xpert Xpress SARS-CoV-2/FLU/RSV  testing.  Fact Sheet for Patients: PinkCheek.be  Fact Sheet for Healthcare Providers: GravelBags.it  This test is not yet approved or cleared by the [place] FDA and  has been authorized for detection and/or diagnosis of SARS-CoV-2 by  FDA under an Emergency Use Authorization (EUA). This EUA will remain  in effect (meaning this test can be used) for the duration of the  Covid-19 declaration under Section  564(b)(1) of the Act, 21  U.S.C. section 360bbb-3(b)(1), unless the authorization is  terminated or revoked. Performed at [facility], [place] [address] [place], [place] [address]          Radiology Studies: No results found.      Scheduled Meds: . Chlorhexidine Gluconate Cloth  6 each Topical Q0600  . collagenase   Topical Daily  . doxercalciferol  2 mcg Intravenous Q M,W,F-HD  . insulin aspart  0-6 Units Subcutaneous Q4H  . insulin detemir  20 Units Subcutaneous Daily  . midodrine  10 mg Oral TID WC  . montelukast  10 mg Oral Daily  . pravastatin  40 mg Oral Daily   Continuous Infusions:        Aline August, MD [facility] 05/21/2020, 7:46 AM

## 2020-05-21 NOTE — Progress Notes (Signed)
69 year old female seen.  Palliative care team at bedside.  Patient had left bimalleolar ankle fracture likely occurred from severe osteopenia and use of her power chair with visual impairment.  She has a right proximal tibial metaphyseal fracture on the right side that has been managed initially with splint then converted to a Bledsoe brace.  Patient developed hematoma over thigh which was surgically debrided also has history of pressure ulcer on her back stage IV debrided 04/14/2020.  Currently bedside she does not have a cam boot in the room does not have the Bledsoe brace in the room.  She is in a bariatric obese bed.  No surgery recommended for her lower extremity fracture since she is a nonambulator and has severe osteopenia/osteoporosis from disuse over several years.

## 2020-05-21 NOTE — Progress Notes (Signed)
[facility] ROUNDING NOTE   Subjective:   Brief history: 69 year old female morbid obesity end-stage renal disease Monday Wednesday Friday history of pulmonary embolus on Eliquis insulin-dependent diabetes mellitus type 2, hypertension hyperlipidemia asthma admitted with ruptured hematoma right lower leg.  Status post evacuation of hematoma cauterization of bleeding area and closure of wound by general surgery 05/14/2020  Blood pressure 54/34 [chronic hypotension] pulse 97 temperature 98.6.  O2 sats 87% nasal cannula  Sodium 130 potassium 4.9 chloride 93 CO2 22 BUN 43 creatinine 5.8 glucose 227 calcium 9.1 hemoglobin 8.2  Hectorol 2 mcg Monday Wednesday Friday, insulin sliding scale, Levemir 20 units daily, midodrine 10 mg 3 times daily singular 10 mg daily pravastatin 40 mg daily    Objective:  Vital signs in last 24 hours:  Temp:  [97.7 F (36.5 C)-98.8 F (37.1 C)] 98.6 F (37 C) (10/11 0356) Pulse Rate:  [88-94] 94 (10/11 0300) Resp:  [15-20] 18 (10/11 0300) BP: (68-93)/(36-74) 86/74 (10/11 0356) SpO2:  [98 %-100 %] 98 % (10/11 0300)  Weight change:  Filed Weights   05/18/20 0340  Weight: (!) 194.6 kg    Intake/Output: I/O last 3 completed shifts: In: 960 [P.O.:960] Out: -    Intake/Output this shift:  No intake/output data recorded.  General:Obese female, nad Lungs: Clear, anteriorly, diminished breath sounds.Breathing is unlabored. Heart:RRR with S1 S2 Abdomen: softobese, non-tender Lower extremities:R leg in bandage;mild-mod bilat pitting LE edema  Neuro: A &O X 3. Moves all extremities spontaneously. Dialysis Access:LUEAVG+bruit; some bruising to LUE   Basic Metabolic Panel: Recent Labs  Lab 05/15/20 0039 05/15/20 0039 05/16/20 0045 05/16/20 0045 05/17/20 0034 05/17/20 0034 05/18/20 0013 05/19/20 0052 05/21/20 0123  NA 133*   < > 129*  --  132*  --  130* 135 130*  K 4.8   < > 4.1  --  3.4*  --  3.5 4.0 4.9  CL 92*   <  > 89*  --  93*  --  92* 95* 93*  CO2 22   < > 23  --  25  --  23 25 22   GLUCOSE 198*   < > 225*  --  237*  --  183* 149* 227*  BUN 30*   < > 45*  --  31*  --  39* 21 43*  CREATININE 4.55*   < > 6.01*  --  4.43*  --  5.46* 3.66* 5.88*  CALCIUM 8.4*   < > 8.8*   < > 8.7*   < > 8.9 8.8* 9.1  MG 2.0  --   --   --   --   --   --   --   --   PHOS 5.1*  --   --   --   --   --   --   --   --    < > = values in this interval not displayed.    Liver Function Tests: Recent Labs  Lab 05/15/20 0039  ALBUMIN 2.3*   No results for input(s): LIPASE, AMYLASE in the last 168 hours. No results for input(s): AMMONIA in the last 168 hours.  CBC: Recent Labs  Lab 05/16/20 0045 05/17/20 0034 05/18/20 0013 05/19/20 0052 05/21/20 0123  WBC 12.4* 12.8* 12.4* 10.6* 10.6*  NEUTROABS 10.2* 10.8* 10.3* 9.5* 9.5*  HGB 7.8* 7.3* 7.8* 8.3* 8.2*  HCT 25.7* 24.2* 25.0* 27.3* 26.9*  MCV 94.8 96.4 98.0 98.9 97.8  PLT 121* 121* 127* 130* 173  Cardiac Enzymes: No results for input(s): CKTOTAL, CKMB, CKMBINDEX, TROPONINI in the last 168 hours.  BNP: Invalid input(s): POCBNP  CBG: Recent Labs  Lab 05/20/20 1059 05/20/20 1654 05/20/20 2047 05/21/20 0040 05/21/20 0428  GLUCAP 173* 232* 251* 240* 192*    Microbiology: Results for orders placed or performed during the hospital encounter of 05/13/20  Respiratory Panel by RT PCR (Flu A&B, Covid) - Nasopharyngeal Swab     Status: None   Collection Time: 05/13/20  5:07 PM   Specimen: Nasopharyngeal Swab  Result Value Ref Range Status   SARS Coronavirus 2 by RT PCR NEGATIVE NEGATIVE Final    Comment: (NOTE) SARS-CoV-2 target nucleic acids are NOT DETECTED.  The SARS-CoV-2 RNA is generally detectable in upper respiratoy specimens during the acute phase of infection. The lowest concentration of SARS-CoV-2 viral copies this assay can detect is 131 copies/mL. A negative result does not preclude SARS-Cov-2 infection and should not be used as the sole  basis for treatment or other patient management decisions. A negative result may occur with  improper specimen collection/handling, submission of specimen other than nasopharyngeal swab, presence of viral mutation(s) within the areas targeted by this assay, and inadequate number of viral copies (<131 copies/mL). A negative result must be combined with clinical observations, patient history, and epidemiological information. The expected result is Negative.  Fact Sheet for Patients:  PinkCheek.be  Fact Sheet for Healthcare Providers:  GravelBags.it  This test is no t yet approved or cleared by the [place] FDA and  has been authorized for detection and/or diagnosis of SARS-CoV-2 by FDA under an Emergency Use Authorization (EUA). This EUA will remain  in effect (meaning this test can be used) for the duration of the COVID-19 declaration under Section 564(b)(1) of the Act, 21 U.S.C. section 360bbb-3(b)(1), unless the authorization is terminated or revoked sooner.     Influenza A by PCR NEGATIVE NEGATIVE Final   Influenza B by PCR NEGATIVE NEGATIVE Final    Comment: (NOTE) The Xpert Xpress SARS-CoV-2/FLU/RSV assay is intended as an aid in  the diagnosis of influenza from Nasopharyngeal swab specimens and  should not be used as a sole basis for treatment. Nasal washings and  aspirates are unacceptable for Xpert Xpress SARS-CoV-2/FLU/RSV  testing.  Fact Sheet for Patients: PinkCheek.be  Fact Sheet for Healthcare Providers: GravelBags.it  This test is not yet approved or cleared by the [place] FDA and  has been authorized for detection and/or diagnosis of SARS-CoV-2 by  FDA under an Emergency Use Authorization (EUA). This EUA will remain  in effect (meaning this test can be used) for the duration of the  Covid-19 declaration under Section 564(b)(1) of the  Act, 21  U.S.C. section 360bbb-3(b)(1), unless the authorization is  terminated or revoked. Performed at [facility], [place] [address], [place], [place] [address]     Coagulation Studies: No results for input(s): LABPROT, INR in the last 72 hours.  Urinalysis: No results for input(s): COLORURINE, LABSPEC, PHURINE, GLUCOSEU, HGBUR, BILIRUBINUR, KETONESUR, PROTEINUR, UROBILINOGEN, NITRITE, LEUKOCYTESUR in the last 72 hours.  Invalid input(s): APPERANCEUR    Imaging: No results found.   Medications:    . Chlorhexidine Gluconate Cloth  6 each Topical Q0600  . collagenase   Topical Daily  . doxercalciferol  2 mcg Intravenous Q M,W,F-HD  . insulin aspart  0-6 Units Subcutaneous Q4H  . insulin detemir  20 Units Subcutaneous Daily  . midodrine  10 mg Oral TID WC  . montelukast  10 mg Oral Daily  . pravastatin  40 mg Oral Daily   acetaminophen **OR** acetaminophen, albuterol, hydrOXYzine, ondansetron **OR** ondansetron (ZOFRAN) IV, oxyCODONE, traMADol  Assessment/ Plan:  Outpatient hemodialysis: [facility] Monday Wednesday Friday   4h 23min 500/800   156kg  2K/2.25Ca   L AVG   Heparin none Hectorol 2  Venofer 50 q week Mircera 225 (last 9/27)  1. Traumatic hematoma of RLE - s/p surgical evacuation 10/4. Being f/b gen surgery. Lots of leg pain. Appreciate gen surg assistance.  2. ESRD -HD MWF. Next HD 05/21/2020. No heparin  3. Volume- Chronic hypotension on midodrine. Significant LE edema unable to pull volume due to BP drops on HD. Midodrine started here at 10 mg tid.   4. Anemia - Post op blood loss noted. S/p 2 u prbcs 10/3.  Recent ESAdose as outpatient. Hb 7- 8 range here.  5. Metabolic bone disease -Ca/phos ok.  Continue binders/Hectorol. 6. Nutrition -Renal diet/vitalmins 7. Hx PE- Holding Eliquis 8. H/o blindness 9. IDDM - per primary 10. Morbid obesity/ debility / WC dependent  11. Prognosis - pt is now DNR, appreciate [facility] and palliative  assistance. LTAC is one option, awaiting response. OP HD would not be possible w/ her low BP's and immobility. Prognosis appears quite poor. Have d/w pmd.    LOS: 8 Sherril Croon @TODAY @6 :14 AM

## 2020-05-21 NOTE — Progress Notes (Signed)
°   05/21/20 1400  Clinical Encounter Type  Visited With Patient  Visit Type Spiritual support  Referral From Nurse  Consult/Referral To [place]  The chaplain spoke to the patient, and she desires to have a POA/AD. She has her own paperwork; however, I let her know we would need to complete the [facility] paperwork in order to have it notarized. The chaplain left the paperwork for the patient to complete. The chaplain spoke with the unit secretary Jearld Pies) and nurse Francisca December) to give them an update

## 2020-05-21 NOTE — Progress Notes (Signed)
Daily Progress Note   Patient Name: Suzanne Levy       Date: 05/21/2020 DOB: March 26, 1951  Age: 69 y.o. MRN#: [id number] Attending Physician: Aline August, MD Primary Care Physician: [name], MD Admit Date: 05/13/2020  Reason for Consultation/Follow-up: To discuss complex medical decision making related to patient's goals of care  Asked by [facility] and Nephrology to follow up with patient as her medical condition is becoming very fragile.  Subjective: Patient is awake after HD and wants to eat.  She has a left ankle fracture.  Dr. [name] is examining her.  He feels as she is currently bedbound and very fragile surgery is a serious risk and recommends against it.  I spoke to [place] - she is in pain and requests pain medication.  I ask about her conversation with Aniceto Boss and mention that she has quality of life.  She responds, "I do?" I asked about living in the bed and receiving HD in the bed - she responded she was ok with that.   I asked her what she enjoyed and responded music but did not tell me what type.  I asked her what was important for her quality of life.  I also asked when would she want the medical team to keep pressing forward vs when would she just want to be comfortable and allowed to pass?   She said she needed to think about those questions.  Afterward I called her sister Suzanne Levy.   Suzanne Levy added on Suzanne Levy (in [place]).  I relayed our discussion and explained to the patient's sisters that the doctors are worried [name] is becoming very fragile.  At some point soon her body will no longer be able to tolerate hemodialysis.    Suzanne Levy cried "I'm not ready".  Suzanne Levy was strong and explained to [name] that "it's not about [place] being ready".  Suzanne Levy quickly understood that  the time is fast approaching when [name]'s body will no longer tolerate hemodialysis and [name]'s mind needs to be prepared for that time.  She asked if I had explained this very clearly to [name].  I told her that I had not because I wanted family support present when that type of news is given.      Suzanne Levy stated she will come to the hospital tomorrow.  Suzanne Levy is available by conference  call at 3:00 pm.  We planned to have a conversation with [name] to clearly explain that her body is becoming more fragile and that at some point soon she will not be able to tolerate HD any longer.  At that point Hospice will be available to provide support.    Suzanne Levy felt it was very important to determine how [name] thinks and feels about all of this.     Assessment: Very pleasant somewhat lethargic female, blind, in pain.  Now with ankle fracture unable to tolerate surgery.  Has chronic hypotension and is becoming extremely fragile.  Patient Profile/HPI:  69 y.o. female  with past medical history of legally blind, ESRD on HD, history of PE on Eliquis, IDDM 2, HTN/HLD, asthma, morbid obesity/wheelchair-bound admitted on 05/13/2020 with ruptured hematoma of the right lower leg after being hit by the automatic door at dialysis on 10/1.   Went to OR for surgical clean out and suture of wound. Required blood transfusion.  Length of Stay: 8   Vital Signs: BP (!) 137/98 (BP Location: Right Wrist)   Pulse 94   Temp 98.1 F (36.7 C) (Oral)   Resp 14   Ht 5\' 11"  (1.803 m)   Wt (!) 194.6 kg   LMP  (LMP Unknown)   SpO2 100%   BMI 59.83 kg/m  SpO2: SpO2: 100 % O2 Device: O2 Device: Nasal Cannula O2 Flow Rate: O2 Flow Rate (L/min): 2 L/min       Palliative Assessment/Data: 30%     Palliative Care Plan    Recommendations/Plan:  Follow PMT meeting tomorrow at bedside at 3:00 pm with patient, and sister Suzanne Levy in person, as well as sister Suzanne Levy on the phone.  Suzanne Levy's number is  [phone]  Code Status:  DNR  Prognosis:   < 6 months would not be surprising given hypotension, multiple wounds, decline in mobility, the inability to tolerate outpatient HD - necessitating LTAC.  Discharge Planning:  To Be Determined  Care plan was discussed with Dr. Starla Link, patient, and family.  Thank you for allowing the Palliative Medicine Team to assist in the care of this patient.  Total time spent:  60 min.     Greater than 50%  of this time was spent counseling and coordinating care related to the above assessment and plan.  Florentina Jenny, PA-C Palliative Medicine  Please contact Palliative MedicineTeam phone at [phone] for questions and concerns between 7 am - 7 pm.   Please see AMION for individual provider pager numbers.

## 2020-05-21 NOTE — Progress Notes (Signed)
8 Days Post-Op    CC: hematoma RLE  Subjective: She is status post bleed in her right lower extremity with drainage of the hematoma by Dr. Kieth Brightly.  He put the area back together but it is healing poorly.  Patient is s/p dialysis so she is rather tired and lethargic this p.m.  She cannot remember the last time she got up and walked to the bathroom at home.  Objective: Vital signs in last 24 hours: Temp:  [97.7 F (36.5 C)-98.8 F (37.1 C)] 98.1 F (36.7 C) (10/11 1206) Pulse Rate:  [88-105] 94 (10/11 1206) Resp:  [14-25] 14 (10/11 1206) BP: (68-157)/(25-98) 137/98 (10/11 1206) SpO2:  [98 %-100 %] 100 % (10/11 1206) Last BM Date: 05/16/20 Afebrile blood pressure is going back and forth between hypotension and mild hypertension.  She remains on nasal cannula 2 L.  She has intermittent tachycardia. NA 130, glucose 227, creatinine 5.88, WBC 10.6 H/H 8.2/26.9, platelets 173,000  Intake/Output from previous day: 10/10 0701 - 10/11 0700 In: 320 [P.O.:320] Out: -  Intake/Output this shift: Total I/O In: -  Out: 2000 [Other:2000]  General appearance: alert, cooperative, no distress and lethargic after HD Skin: see below  05/15/20  Today 05/21/20 You can see progression of wound with new desicated area mid wound, blebs over a majority of the flap with ongoing breakdown of the edges.    Lab Results:  Recent Labs    05/19/20 0052 05/21/20 0123  WBC 10.6* 10.6*  HGB 8.3* 8.2*  HCT 27.3* 26.9*  PLT 130* 173    BMET Recent Labs    05/19/20 0052 05/21/20 0123  NA 135 130*  K 4.0 4.9  CL 95* 93*  CO2 25 22  GLUCOSE 149* 227*  BUN 21 43*  CREATININE 3.66* 5.88*  CALCIUM 8.8* 9.1   PT/INR No results for input(s): LABPROT, INR in the last 72 hours.  Recent Labs  Lab 05/15/20 0039  ALBUMIN 2.3*     Lipase     Component Value Date/Time   LIPASE 25 04/04/2020 1626     Medications: . acetaminophen      . Chlorhexidine Gluconate Cloth  6 each Topical Q0600  .  collagenase   Topical Daily  . doxercalciferol  2 mcg Intravenous Q M,W,F-HD  . insulin aspart  0-6 Units Subcutaneous Q4H  . insulin detemir  20 Units Subcutaneous Daily  . midodrine      . midodrine  10 mg Oral TID WC  . montelukast  10 mg Oral Daily  . pravastatin  40 mg Oral Daily    Assessment/Plan ESRD  -  HD MWF Hypotension HLD Asthma DM-2 Morbid obesity BMI 49 H/o PE/DVT on eliquis ABL anemia - H/H 7.1/23.6 >>8.2/26.9 Hyponatremia  - Na 130  Pannus wound - noted on left lateral side. Will obtain u/s to evaluate for underlying abscess; patient refused this yesterday but willing to try today so I called radiology to reschedule. Wound care per WOC  Ruptured hematoma -S/p evacuation of hematoma from right leg, cauterization of bleeding areas, closure of 20 cm of wounds 10/4 Dr. Kieth Brightly - POD# 7  ID - doxycycline 10/4-10/7; linezolid 10/4 x 1; cleocin 10/3-10/4 FEN - CM/renal diet VTE - SCD Foley - none  Plan:  No real improvement in the flap and it does not appear that this is going to heal.  We may need Plastic's consult at some point, if the flap does not show marked improvement.      LOS:  8 days    Suzanne Levy 05/21/2020 Please see Amion

## 2020-05-21 NOTE — Progress Notes (Signed)
Inpatient Diabetes Program Recommendations  AACE/ADA: New Consensus Statement on Inpatient Glycemic Control (2015)  Target Ranges:  Prepandial:   less than 140 mg/dL      Peak postprandial:   less than 180 mg/dL (1-2 hours)      Critically ill patients:  140 - 180 mg/dL   Lab Results  Component Value Date   GLUCAP 187 (H) 05/21/2020   HGBA1C 7.1 (H) 03/01/2020    Review of Glycemic Control Results for Suzanne Levy, Suzanne Levy (MRN [id number]) as of 05/21/2020 08:29  Ref. Range 05/20/2020 20:47 05/21/2020 00:40 05/21/2020 04:28 05/21/2020 06:22  Glucose-Capillary Latest Ref Range: 70 - 99 mg/dL 251 (H) 240 (H) 192 (H) 187 (H)   Diabetes history: Type 2 DM Outpatient Diabetes medications: NPH 36 units QAM, 20 units QPM Current orders for Inpatient glycemic control: Levemir 20 units QD, Novolog 0-6 units Q4H  Inpatient Diabetes Program Recommendations:    Consider increasing Levemir to 24 units QD.   Thanks, Bronson Curb, MSN, RNC-OB Diabetes Coordinator [phone] (8a-5p)

## 2020-05-22 DIAGNOSIS — Z515 Encounter for palliative care: Secondary | ICD-10-CM | POA: Diagnosis not present

## 2020-05-22 DIAGNOSIS — I9589 Other hypotension: Secondary | ICD-10-CM | POA: Diagnosis not present

## 2020-05-22 DIAGNOSIS — S8011XD Contusion of right lower leg, subsequent encounter: Secondary | ICD-10-CM

## 2020-05-22 DIAGNOSIS — S82842A Displaced bimalleolar fracture of left lower leg, initial encounter for closed fracture: Secondary | ICD-10-CM | POA: Diagnosis not present

## 2020-05-22 DIAGNOSIS — N186 End stage renal disease: Secondary | ICD-10-CM | POA: Diagnosis not present

## 2020-05-22 DIAGNOSIS — D62 Acute posthemorrhagic anemia: Secondary | ICD-10-CM | POA: Diagnosis not present

## 2020-05-22 LAB — CBC WITH DIFFERENTIAL/PLATELET
Abs Immature Granulocytes: 0.06 10*3/uL (ref 0.00–0.07)
Basophils Absolute: 0 10*3/uL (ref 0.0–0.1)
Basophils Relative: 0 %
Eosinophils Absolute: 0.1 10*3/uL (ref 0.0–0.5)
Eosinophils Relative: 1 %
HCT: 23.6 % — ABNORMAL LOW (ref 36.0–46.0)
Hemoglobin: 7.2 g/dL — ABNORMAL LOW (ref 12.0–15.0)
Immature Granulocytes: 1 %
Lymphocytes Relative: 3 %
Lymphs Abs: 0.3 10*3/uL — ABNORMAL LOW (ref 0.7–4.0)
MCH: 29.8 pg (ref 26.0–34.0)
MCHC: 30.5 g/dL (ref 30.0–36.0)
MCV: 97.5 fL (ref 80.0–100.0)
Monocytes Absolute: 0.8 10*3/uL (ref 0.1–1.0)
Monocytes Relative: 7 %
Neutro Abs: 8.9 10*3/uL — ABNORMAL HIGH (ref 1.7–7.7)
Neutrophils Relative %: 88 %
Platelets: ADEQUATE 10*3/uL (ref 150–400)
RBC: 2.42 MIL/uL — ABNORMAL LOW (ref 3.87–5.11)
RDW: 23 % — ABNORMAL HIGH (ref 11.5–15.5)
WBC: 10.1 10*3/uL (ref 4.0–10.5)
nRBC: 0 % (ref 0.0–0.2)

## 2020-05-22 LAB — GLUCOSE, CAPILLARY
Glucose-Capillary: 146 mg/dL — ABNORMAL HIGH (ref 70–99)
Glucose-Capillary: 142 mg/dL — ABNORMAL HIGH (ref 70–99)
Glucose-Capillary: 152 mg/dL — ABNORMAL HIGH (ref 70–99)
Glucose-Capillary: 148 mg/dL — ABNORMAL HIGH (ref 70–99)
Glucose-Capillary: 163 mg/dL — ABNORMAL HIGH (ref 70–99)
Glucose-Capillary: 137 mg/dL — ABNORMAL HIGH (ref 70–99)

## 2020-05-22 LAB — BASIC METABOLIC PANEL
Anion gap: 11 (ref 5–15)
BUN: 26 mg/dL — ABNORMAL HIGH (ref 8–23)
CO2: 27 mmol/L (ref 22–32)
Calcium: 8.6 mg/dL — ABNORMAL LOW (ref 8.9–10.3)
Chloride: 97 mmol/L — ABNORMAL LOW (ref 98–111)
Creatinine, Ser: 4.34 mg/dL — ABNORMAL HIGH (ref 0.44–1.00)
GFR, Estimated: 10 mL/min — ABNORMAL LOW (ref >60)
Glucose, Bld: 170 mg/dL — ABNORMAL HIGH (ref 70–99)
Potassium: 3.5 mmol/L (ref 3.5–5.1)
Sodium: 135 mmol/L (ref 135–145)

## 2020-05-22 MED ORDER — CHLORHEXIDINE GLUCONATE CLOTH 2 % EX PADS
6.0000 | MEDICATED_PAD | Freq: Every day | CUTANEOUS | Status: DC
  Administered 2020-05-22 – 2020-05-25 (×3): 6 via TOPICAL

## 2020-05-22 NOTE — Progress Notes (Signed)
Patient's wound care was performed this morning. Right surgical incision and injuries on the left ankle, right hip, left and right abdomen, abdominal pannus, and sacrum. Wounds consisted of white drainage and were pink inside. All dressing dated.

## 2020-05-22 NOTE — Progress Notes (Signed)
CPAP declined at this time.

## 2020-05-22 NOTE — Progress Notes (Signed)
[facility] ROUNDING NOTE   Subjective:   Brief history: 69 year old female morbid obesity end-stage renal disease Monday Wednesday Friday history of pulmonary embolus on Eliquis insulin-dependent diabetes mellitus type 2, hypertension hyperlipidemia asthma admitted with ruptured hematoma right lower leg.  Status post evacuation of hematoma cauterization of bleeding area and closure of wound by general surgery 05/14/2020. Tolerated dialysis 2 L removed 05/21/2020 next dialysis treatment be 05/23/2020  Patient has been seen and assessed by palliative medicine appreciate assistance of [facility]  Blood pressure 88/42 pulse 75 temperature 98.4 O2 sats 99% 4 L nasal cannula  Sodium 135 potassium 3.5 chloride 97 CO2 27 BUN 26 creatinine 4.34 glucose 170 calcium 8.6 hemoglobin 7.2  Hectorol 2 mcg Monday Wednesday Friday, insulin sliding scale, Levemir 20 units daily, midodrine 10 mg 3 times daily singular 10 mg daily pravastatin 40 mg daily    Objective:  Vital signs in last 24 hours:  Temp:  [97.7 F (36.5 C)-98.8 F (37.1 C)] 98.4 F (36.9 C) (10/11 1936) Pulse Rate:  [94-108] 105 (10/11 1936) Resp:  [14-25] 20 (10/11 1936) BP: (83-137)/(25-98) 88/42 (10/11 1936) SpO2:  [93 %-100 %] 99 % (10/11 1936) Weight:  [0.03 kg] 0.03 kg (10/12 0102)  Weight change:  Filed Weights   05/21/20 2214 05/22/20 0102  Weight: (!) 0.03 kg (!) 0.03 kg    Intake/Output: I/O last 3 completed shifts: In: 240 [P.O.:240] Out: 2000 [Other:2000]   Intake/Output this shift:  No intake/output data recorded.  General:Obese female, nad Lungs: Clear, anteriorly, diminished breath sounds.Breathing is unlabored. Heart:RRR with S1 S2 Abdomen: softobese, non-tender Lower extremities:R leg in bandage;mild-mod bilat pitting LE edema  Neuro: A &O X 3. Moves all extremities spontaneously. Dialysis Access:LUEAVG+bruit; some bruising to LUE   Basic Metabolic Panel: Recent  Labs  Lab 05/17/20 0034 05/17/20 0034 05/18/20 0013 05/18/20 0013 05/19/20 0052 05/21/20 0123 05/22/20 0126  NA 132*  --  130*  --  135 130* 135  K 3.4*  --  3.5  --  4.0 4.9 3.5  CL 93*  --  92*  --  95* 93* 97*  CO2 25  --  23  --  25 22 27   GLUCOSE 237*  --  183*  --  149* 227* 170*  BUN 31*  --  39*  --  21 43* 26*  CREATININE 4.43*  --  5.46*  --  3.66* 5.88* 4.34*  CALCIUM 8.7*   < > 8.9   < > 8.8* 9.1 8.6*   < > = values in this interval not displayed.    Liver Function Tests: No results for input(s): AST, ALT, ALKPHOS, BILITOT, PROT, ALBUMIN in the last 168 hours. No results for input(s): LIPASE, AMYLASE in the last 168 hours. No results for input(s): AMMONIA in the last 168 hours.  CBC: Recent Labs  Lab 05/17/20 0034 05/18/20 0013 05/19/20 0052 05/21/20 0123 05/22/20 0126  WBC 12.8* 12.4* 10.6* 10.6* 10.1  NEUTROABS 10.8* 10.3* 9.5* 9.5* 8.9*  HGB 7.3* 7.8* 8.3* 8.2* 7.2*  HCT 24.2* 25.0* 27.3* 26.9* 23.6*  MCV 96.4 98.0 98.9 97.8 97.5  PLT 121* 127* 130* 173 PLATELET CLUMPS NOTED ON SMEAR, COUNT APPEARS ADEQUATE    Cardiac Enzymes: No results for input(s): CKTOTAL, CKMB, CKMBINDEX, TROPONINI in the last 168 hours.  BNP: Invalid input(s): POCBNP  CBG: Recent Labs  Lab 05/21/20 1208 05/21/20 1642 05/21/20 1939 05/21/20 2332 05/22/20 0306  GLUCAP 135* 161* 167* 169* 146*    Microbiology: Results for  orders placed or performed during the hospital encounter of 05/13/20  Respiratory Panel by RT PCR (Flu A&B, Covid) - Nasopharyngeal Swab     Status: None   Collection Time: 05/13/20  5:07 PM   Specimen: Nasopharyngeal Swab  Result Value Ref Range Status   SARS Coronavirus 2 by RT PCR NEGATIVE NEGATIVE Final    Comment: (NOTE) SARS-CoV-2 target nucleic acids are NOT DETECTED.  The SARS-CoV-2 RNA is generally detectable in upper respiratoy specimens during the acute phase of infection. The lowest concentration of SARS-CoV-2 viral copies this assay  can detect is 131 copies/mL. A negative result does not preclude SARS-Cov-2 infection and should not be used as the sole basis for treatment or other patient management decisions. A negative result may occur with  improper specimen collection/handling, submission of specimen other than nasopharyngeal swab, presence of viral mutation(s) within the areas targeted by this assay, and inadequate number of viral copies (<131 copies/mL). A negative result must be combined with clinical observations, patient history, and epidemiological information. The expected result is Negative.  Fact Sheet for Patients:  PinkCheek.be  Fact Sheet for Healthcare Providers:  GravelBags.it  This test is no t yet approved or cleared by the [place] FDA and  has been authorized for detection and/or diagnosis of SARS-CoV-2 by FDA under an Emergency Use Authorization (EUA). This EUA will remain  in effect (meaning this test can be used) for the duration of the COVID-19 declaration under Section 564(b)(1) of the Act, 21 U.S.C. section 360bbb-3(b)(1), unless the authorization is terminated or revoked sooner.     Influenza A by PCR NEGATIVE NEGATIVE Final   Influenza B by PCR NEGATIVE NEGATIVE Final    Comment: (NOTE) The Xpert Xpress SARS-CoV-2/FLU/RSV assay is intended as an aid in  the diagnosis of influenza from Nasopharyngeal swab specimens and  should not be used as a sole basis for treatment. Nasal washings and  aspirates are unacceptable for Xpert Xpress SARS-CoV-2/FLU/RSV  testing.  Fact Sheet for Patients: PinkCheek.be  Fact Sheet for Healthcare Providers: GravelBags.it  This test is not yet approved or cleared by the [place] FDA and  has been authorized for detection and/or diagnosis of SARS-CoV-2 by  FDA under an Emergency Use Authorization (EUA). This EUA will remain   in effect (meaning this test can be used) for the duration of the  Covid-19 declaration under Section 564(b)(1) of the Act, 21  U.S.C. section 360bbb-3(b)(1), unless the authorization is  terminated or revoked. Performed at [facility], [place] [address], [place], [place] [address]     Coagulation Studies: No results for input(s): LABPROT, INR in the last 72 hours.  Urinalysis: No results for input(s): COLORURINE, LABSPEC, PHURINE, GLUCOSEU, HGBUR, BILIRUBINUR, KETONESUR, PROTEINUR, UROBILINOGEN, NITRITE, LEUKOCYTESUR in the last 72 hours.  Invalid input(s): APPERANCEUR    Imaging: No results found.   Medications:    . Chlorhexidine Gluconate Cloth  6 each Topical Q0600  . collagenase   Topical Daily  . doxercalciferol  2 mcg Intravenous Q M,W,F-HD  . insulin aspart  0-6 Units Subcutaneous Q4H  . insulin detemir  20 Units Subcutaneous Daily  . midodrine  10 mg Oral TID WC  . montelukast  10 mg Oral Daily  . pravastatin  40 mg Oral Daily   acetaminophen **OR** acetaminophen, albuterol, hydrOXYzine, ondansetron **OR** ondansetron (ZOFRAN) IV, oxyCODONE, traMADol  Assessment/ Plan:  Outpatient hemodialysis: [facility] Monday Wednesday Friday   4h 90min 500/800   156kg  2K/2.25Ca  L AVG   Heparin none Hectorol 2  Venofer 50 q week Mircera 225 (last 9/27)  1. Traumatic hematoma of RLE - s/p surgical evacuation 10/4. Being f/b gen surgery. Lots of leg pain. Appreciate gen surg assistance.  2. ESRD -HD MWF.   HD 05/21/2020.  Tolerated dialysis 05/21/2020 with 2 L removed.  Next dialysis treatment will be 10/13/2021no heparin  3. Volume- Chronic hypotension on midodrine. Significant LE edema unable to pull volume due to BP drops on HD. Midodrine started here at 10 mg tid.  Palliative medicine involved due to poor prognosis  4. Anemia - Post op blood loss noted. S/p 2 u prbcs 10/3.  Recent ESAdose as outpatient. Hb 7- 8 range here.  5. Metabolic bone disease  -Ca/phos ok.  Continue binders/Hectorol. 6. Nutrition -Renal diet/vitalmins 7. Hx PE- Holding Eliquis 8. H/o blindness 9. IDDM - per primary 10. Morbid obesity/ debility / WC dependent  11. Prognosis - pt is now DNR, appreciate [facility] and palliative assistance. LTAC is one option, awaiting response. OP HD would not be possible w/ her low BP's and immobility. Prognosis appears quite poor.  Options may be to discharge to [facility]   LOS: [place] @TODAY @7 :10 AM

## 2020-05-22 NOTE — Progress Notes (Signed)
Patient ID: Suzanne Levy, female   DOB: 1951/07/16, 69 y.o.   MRN: [id number]  PROGRESS NOTE    Suzanne Levy  GEX:[id number] DOB: Oct 13, 1950 DOA: 05/13/2020 PCP: [name], MD   Brief Narrative:  69 year old female, morbidly obese, with past medical history significant for ESRD on MWF HD, history of PE on Eliquis, insulin-dependent type 2 diabetes, hypertension, hyperlipidemia, morbid obesity and asthma who is wheelchair-bound was admitted with ruptured hematoma of the right lower leg.  She underwent evacuation of the hematoma, cauterization of bleeding areas and closure of the wound by general surgery.  Nephrology is following for dialysis needs.  Assessment & Plan:   Ruptured hematoma of the right lower extremity -Occurred in the setting of anticoagulation use -Status post evacuation of the hematoma, cauterization of the bleeding vessels and closure of the large wound by general surgery -Doxycycline has been discontinued by general surgery -Eliquis still on hold. -Wound care as per general surgery recommendations  Acute blood loss anemia -Due to above.  Status post 2 units packed red cells transfusion.  Hemoglobin 7.2 today.  End-stage renal disease on hemodialysis -Nephrology following.  Hemodialysis as per nephrology schedule and recommendations.  Outpatient hemodialysis would not be possible with her low blood pressure and immobility as per nephrology.  Chronic hypotension -Continue midodrine. Blood pressure still  low overnight.  Hyperkalemia -Lokelma has been discontinued.  Resolved.  Nephrology following.  History of PE/DVT on Eliquis -Eliquis on hold  Insulin-dependent diabetes mellitus type 2 uncontrolled with hyperglycemia -Blood sugars currently stable.  Continue CBGs with SSI.  Continue Lantus.  Leukocytosis -Resolved  Thrombocytopenia -Questionable cause.  Improved  Morbid obesity/OSA/OHS -Continue CPAP at bedtime.  Left ankle and pannus  wounds -Recent admission at [facility] from 04/11/2020-04/21/2020 requiring debridement of right flank and groin wounds on 04/14/2020.  On oral antibiotics as an outpatient -Wound care team input is appreciated.  General surgery following  Left foot fractures -X-ray of the left ankle is showing bimalleolar fracture.  Orthopedics recommended conservative management with CAM boot and office follow-up.  Generalized deconditioning -Overall prognosis is guarded to poor.  Palliative care following. CODE STATUS changed to DNR. Patient/family hoping that patient will be discharged to [facility]. Social worker aware. -After discussion with nephrology/Dr. Jonnie Finner on 05/20/2020, I have asked palliative care to have further discussion with patient and family regarding patient's overall poor prognosis and probably consideration of hospice. -Palliative care is supposed to have family meeting again today.  DVT prophylaxis: SCDs Code Status: Full Family Communication: None at bedside Disposition Plan: Status is: Inpatient  Remains inpatient appropriate because:Inpatient level of care appropriate due to severity of illness.  [facility] once bed is available and once cleared by general surgery   Dispo: The patient is from: Home              Anticipated d/c is to: SNF/[facility]              Anticipated d/c date is: 1 day              Patient currently is not medically stable to d/c.  Consultants: General surgery/nephrology.  Orthopedics, palliative care  Procedures: Evacuation of hematoma from right leg, cauterization of bleeding areas, closure of 20 cm of wounds on 05/14/2020 by general surgery  Antimicrobials:  Anti-infectives (From admission, onward)   Start     Dose/Rate Route Frequency Ordered Stop   05/14/20 2200  doxycycline (VIBRA-TABS) tablet 100 mg        100 mg Oral 2  times daily 05/14/20 1017 05/17/20 2150   05/14/20 1000  doxycycline (VIBRA-TABS) tablet 100 mg  Status:  Discontinued        100 mg Oral 2  times daily 05/13/20 2038 05/14/20 1017   05/14/20 1000  linezolid (ZYVOX) tablet 600 mg  Status:  Discontinued        600 mg Oral 2 times daily 05/13/20 2038 05/14/20 1017   05/13/20 1945  clindamycin (CLEOCIN) IVPB 900 mg        900 mg 100 mL/hr over 30 Minutes Intravenous On call to O.R. 05/13/20 1933 05/14/20 0040       Subjective: Patient seen and examined at bedside.  Extremely poor historian.  Very poor historian.  Awake, hardly participates in any conversation.  No overnight fever, vomiting reported.  Objective: Vitals:   05/21/20 1936 05/21/20 2214 05/22/20 0102 05/22/20 0800  BP: (!) 88/42   (!) 81/51  Pulse: (!) 105   90  Resp: 20   (!) 23  Temp: 98.4 F (36.9 C)     TempSrc: Axillary     SpO2: 99%   100%  Weight:  (!) 0.03 kg (!) 0.03 kg   Height:        Intake/Output Summary (Last 24 hours) at 05/22/2020 0937 Last data filed at 05/21/2020 1300 Gross per 24 hour  Intake 240 ml  Output 2000 ml  Net -1760 ml   Filed Weights   05/21/20 2214 05/22/20 0102  Weight: (!) 0.03 kg (!) 0.03 kg    Examination:  General exam: No acute distress.  Looks chronically ill.  Looks older than stated age.  Very poor historian. Respiratory system: Bilateral decreased breath sounds at bases with no wheezing.  Some crackles heard  cardiovascular system: Rate controlled, S1-S2 heard  gastrointestinal system: Abdomen is morbidly obese, distended, soft and nontender.  Bowel sounds heard Extremities: Right lower extremity dressing present.  No clubbing.  Bilateral lower extremity edema present  Central nervous system: Awake, still hardly participates in any conversation.  No focal neurological deficits.  Moving extremities Skin: No other petechiae  psychiatry: Affect is very flat  Data Reviewed: I have personally reviewed following labs and imaging studies  CBC: Recent Labs  Lab 05/17/20 0034 05/18/20 0013 05/19/20 0052 05/21/20 0123 05/22/20 0126  WBC 12.8* 12.4* 10.6*  10.6* 10.1  NEUTROABS 10.8* 10.3* 9.5* 9.5* 8.9*  HGB 7.3* 7.8* 8.3* 8.2* 7.2*  HCT 24.2* 25.0* 27.3* 26.9* 23.6*  MCV 96.4 98.0 98.9 97.8 97.5  PLT 121* 127* 130* 173 PLATELET CLUMPS NOTED ON SMEAR, COUNT APPEARS ADEQUATE   Basic Metabolic Panel: Recent Labs  Lab 05/17/20 0034 05/18/20 0013 05/19/20 0052 05/21/20 0123 05/22/20 0126  NA 132* 130* 135 130* 135  K 3.4* 3.5 4.0 4.9 3.5  CL 93* 92* 95* 93* 97*  CO2 25 23 25 22 27   GLUCOSE 237* 183* 149* 227* 170*  BUN 31* 39* 21 43* 26*  CREATININE 4.43* 5.46* 3.66* 5.88* 4.34*  CALCIUM 8.7* 8.9 8.8* 9.1 8.6*   GFR: Estimated Creatinine Clearance: 0.1 mL/min (A) (by C-G formula based on SCr of 4.34 mg/dL (H)). Liver Function Tests: No results for input(s): AST, ALT, ALKPHOS, BILITOT, PROT, ALBUMIN in the last 168 hours. No results for input(s): LIPASE, AMYLASE in the last 168 hours. No results for input(s): AMMONIA in the last 168 hours. Coagulation Profile: No results for input(s): INR, PROTIME in the last 168 hours. Cardiac Enzymes: No results for input(s): CKTOTAL, CKMB, CKMBINDEX, TROPONINI in the  last 168 hours. BNP (last 3 results) No results for input(s): PROBNP in the last 8760 hours. HbA1C: No results for input(s): HGBA1C in the last 72 hours. CBG: Recent Labs  Lab 05/21/20 1642 05/21/20 1939 05/21/20 2332 05/22/20 0306 05/22/20 0834  GLUCAP 161* 167* 169* 146* 142*   Lipid Profile: No results for input(s): CHOL, HDL, LDLCALC, TRIG, CHOLHDL, LDLDIRECT in the last 72 hours. Thyroid Function Tests: No results for input(s): TSH, T4TOTAL, FREET4, T3FREE, THYROIDAB in the last 72 hours. Anemia Panel: No results for input(s): VITAMINB12, FOLATE, FERRITIN, TIBC, IRON, RETICCTPCT in the last 72 hours. Sepsis Labs: No results for input(s): PROCALCITON, LATICACIDVEN in the last 168 hours.  Recent Results (from the past 240 hour(s))  Respiratory Panel by RT PCR (Flu A&B, Covid) - Nasopharyngeal Swab     Status:  None   Collection Time: 05/13/20  5:07 PM   Specimen: Nasopharyngeal Swab  Result Value Ref Range Status   SARS Coronavirus 2 by RT PCR NEGATIVE NEGATIVE Final    Comment: (NOTE) SARS-CoV-2 target nucleic acids are NOT DETECTED.  The SARS-CoV-2 RNA is generally detectable in upper respiratoy specimens during the acute phase of infection. The lowest concentration of SARS-CoV-2 viral copies this assay can detect is 131 copies/mL. A negative result does not preclude SARS-Cov-2 infection and should not be used as the sole basis for treatment or other patient management decisions. A negative result may occur with  improper specimen collection/handling, submission of specimen other than nasopharyngeal swab, presence of viral mutation(s) within the areas targeted by this assay, and inadequate number of viral copies (<131 copies/mL). A negative result must be combined with clinical observations, patient history, and epidemiological information. The expected result is Negative.  Fact Sheet for Patients:  PinkCheek.be  Fact Sheet for Healthcare Providers:  GravelBags.it  This test is no t yet approved or cleared by the [place] FDA and  has been authorized for detection and/or diagnosis of SARS-CoV-2 by FDA under an Emergency Use Authorization (EUA). This EUA will remain  in effect (meaning this test can be used) for the duration of the COVID-19 declaration under Section 564(b)(1) of the Act, 21 U.S.C. section 360bbb-3(b)(1), unless the authorization is terminated or revoked sooner.     Influenza A by PCR NEGATIVE NEGATIVE Final   Influenza B by PCR NEGATIVE NEGATIVE Final    Comment: (NOTE) The Xpert Xpress SARS-CoV-2/FLU/RSV assay is intended as an aid in  the diagnosis of influenza from Nasopharyngeal swab specimens and  should not be used as a sole basis for treatment. Nasal washings and  aspirates are unacceptable for  Xpert Xpress SARS-CoV-2/FLU/RSV  testing.  Fact Sheet for Patients: PinkCheek.be  Fact Sheet for Healthcare Providers: GravelBags.it  This test is not yet approved or cleared by the [place] FDA and  has been authorized for detection and/or diagnosis of SARS-CoV-2 by  FDA under an Emergency Use Authorization (EUA). This EUA will remain  in effect (meaning this test can be used) for the duration of the  Covid-19 declaration under Section 564(b)(1) of the Act, 21  U.S.C. section 360bbb-3(b)(1), unless the authorization is  terminated or revoked. Performed at [facility], [place] [address], [place], [place] [address]          Radiology Studies: No results found.      Scheduled Meds: . Chlorhexidine Gluconate Cloth  6 each Topical Q0600  . Chlorhexidine Gluconate Cloth  6 each Topical Q0600  . collagenase   Topical Daily  .  doxercalciferol  2 mcg Intravenous Q M,W,F-HD  . insulin aspart  0-6 Units Subcutaneous Q4H  . insulin detemir  20 Units Subcutaneous Daily  . midodrine  10 mg Oral TID WC  . montelukast  10 mg Oral Daily  . pravastatin  40 mg Oral Daily   Continuous Infusions:        Aline August, MD [facility] 05/22/2020, 9:37 AM

## 2020-05-22 NOTE — Progress Notes (Signed)
Daily Progress Note   Patient Name: Suzanne Levy       Date: 05/22/2020 DOB: 08-12-1950  Age: 69 y.o. MRN#: [id number] Attending Physician: Aline August, MD Primary Care Physician: [name], MD Admit Date: 05/13/2020  Reason for Consultation/Follow-up: Goals of care/terminal care  Subjective: Patient drowsy but will wake to voice and participate in conversation. Used teach back throughout [name] discussion to determine patient's understanding of her condition.   GOC:   F/u with patient, sister Suzanne Levy at bedside, and sister Suzanne Levy on speaker phone.   Introduced role of palliative medicine.   Asked patient her understanding of her medical condition. Initially, she is unable to verbalize or remember what the doctors have told her this admission.   Discussed events leading up to admission and course of hospitalization including diagnoses, interventions, plan of care. Frankly and compassionately expressed concern with poor prognosis due to challenges with dialysis due to low blood pressure, immobility, and wounds. Explained to Suzanne Levy that unless [facility] will accept her, she is not a candidate for outpatient dialysis (from SNF) due to hypotension, immobility, and inability to sit in the chair during dialysis for >4 hours.   Explored patient's understanding of what would happen when dialysis is stopped. She states "I will die." Frankly and compassionately explained to patient and sisters poor prognosis once dialysis is discontinued and recommendation for hospice facility. Discussed hospice philosophy. Patient states "this is hard." Emotional support provided. She does acknowledge that it has gotten harder with dialysis and that she "don't have a choice" if they providers can no  longer dialyze her.   Patient would like to continue dialysis longer if [facility] will accept her. We did discuss that her body is very fragile and she will likely not tolerate dialysis for much longer. Patient/family confirm understanding of this. Sisters wish to honor her wishes and wait to hear if [facility] is an option. If not, they understand we will further discuss hospice facility placement. Suzanne Levy does speak of not wishing to see her sister suffer.   PMT contact information given. Reassured of ongoing support from this PMT provider this week. Answered questions.    Length of Stay: 9   Vital Signs: BP (!) 81/51   Pulse 90   Temp 98.4 F (36.9 C) (Axillary)   Resp (!) 23  Ht 5\' 11"  (1.803 m)   Wt (!) 0.03 kg   LMP  (LMP Unknown)   SpO2 100%   BMI 0.01 kg/m  SpO2: SpO2: 100 % O2 Device: O2 Device: Nasal Cannula O2 Flow Rate: O2 Flow Rate (L/min): 2 L/min       Palliative Assessment/Data: 30%     Palliative Care Plan    Patient Profile/HPI:  68 y.o. female  with past medical history of legally blind, ESRD on HD, history of PE on Eliquis, IDDM 2, HTN/HLD, asthma, morbid obesity/wheelchair-bound admitted on 05/13/2020 with ruptured hematoma of the right lower leg after being hit by the automatic door at dialysis on 10/1.   Went to OR for surgical clean out and suture of wound. Required blood transfusion. Overall poor prognosis. Outpatient HD not possible with hypotension and immobility. ? LTACH for ongoing dialysis. Awaiting SW follow-up. Palliative medicine consultation for goals of care.   Assessment: ESRD on hemodialysis Ruptured hematoma of RLE Acute blood loss anemia Chronic hypotension Immobility and generalized deconditioning Hyperkalemia Hx of PE/DVT DM type 2 Left foot fracture Left ankle and pannus wounds Morbid obesity OSA/OHS  Recommendations/Plan:  DNR. Otherwise continue current plan of care. Attempt HD on 10/13.   Patient and sisters would like to  see if [facility] will accept her for ongoing dialysis and wound care. SW following and awaiting eligibility.  Patient and sisters understand tenuous clinical condition and that if she is not accepted by [facility], likely will need transition to comfort focused pathway with discontinuation of dialysis and hospice home transfer.   PMT provider will continue to follow daily.   Code Status:  DNR  Prognosis:   Poor long-term prognosis: given hypotension, multiple wounds, decline in mobility, the inability to tolerate outpatient HD - necessitating LTAC.  Discharge Planning:  To Be Determined  Care plan was discussed with patient, sisters, RN, SW  Thank you for allowing the Palliative Medicine Team to assist in the care of this patient.  Total time spent:  40  Greater than 50% of this time was spent counseling and coordinating care related to the above assessment and plan.   Ihor Dow, DNP, FNP-C Palliative Medicine Team  Phone: [phone] Fax: [phone]  Please contact Palliative MedicineTeam phone at [phone] for questions and concerns between 7 am - 7 pm.   Please see AMION for individual provider pager numbers.

## 2020-05-22 NOTE — Progress Notes (Signed)
Pt referred to [facility] to [place] at [facility] and pt was not referred there.  LM with Prem at [facility].  Spoke with Megan, palliative, and pt may be appropriate for LTAC.  Spoke with Raquel Sarna at [facility] and she agrees that pt may meet criteria.  She will review pt and get back to CSW. Lurline Idol, MSW, LCSW 10/12/20213:05 PM

## 2020-05-22 NOTE — Plan of Care (Signed)

## 2020-05-23 DIAGNOSIS — I9589 Other hypotension: Secondary | ICD-10-CM | POA: Diagnosis not present

## 2020-05-23 DIAGNOSIS — S82842A Displaced bimalleolar fracture of left lower leg, initial encounter for closed fracture: Secondary | ICD-10-CM | POA: Diagnosis not present

## 2020-05-23 DIAGNOSIS — S8011XD Contusion of right lower leg, subsequent encounter: Secondary | ICD-10-CM | POA: Diagnosis not present

## 2020-05-23 DIAGNOSIS — N186 End stage renal disease: Secondary | ICD-10-CM | POA: Diagnosis not present

## 2020-05-23 DIAGNOSIS — D62 Acute posthemorrhagic anemia: Secondary | ICD-10-CM | POA: Diagnosis not present

## 2020-05-23 LAB — CBC WITH DIFFERENTIAL/PLATELET
Abs Immature Granulocytes: 0.09 10*3/uL — ABNORMAL HIGH (ref 0.00–0.07)
Basophils Absolute: 0 10*3/uL (ref 0.0–0.1)
Basophils Relative: 0 %
Eosinophils Absolute: 0 10*3/uL (ref 0.0–0.5)
Eosinophils Relative: 0 %
HCT: 25 % — ABNORMAL LOW (ref 36.0–46.0)
Hemoglobin: 7.7 g/dL — ABNORMAL LOW (ref 12.0–15.0)
Immature Granulocytes: 1 %
Lymphocytes Relative: 4 %
Lymphs Abs: 0.3 10*3/uL — ABNORMAL LOW (ref 0.7–4.0)
MCH: 29.6 pg (ref 26.0–34.0)
MCHC: 30.8 g/dL (ref 30.0–36.0)
MCV: 96.2 fL (ref 80.0–100.0)
Monocytes Absolute: 0.5 10*3/uL (ref 0.1–1.0)
Monocytes Relative: 5 %
Neutro Abs: 8.7 10*3/uL — ABNORMAL HIGH (ref 1.7–7.7)
Neutrophils Relative %: 90 %
Platelets: 120 10*3/uL — ABNORMAL LOW (ref 150–400)
RBC: 2.6 MIL/uL — ABNORMAL LOW (ref 3.87–5.11)
RDW: 22.8 % — ABNORMAL HIGH (ref 11.5–15.5)
WBC: 9.7 10*3/uL (ref 4.0–10.5)
nRBC: 0 % (ref 0.0–0.2)

## 2020-05-23 LAB — GLUCOSE, CAPILLARY
Glucose-Capillary: 145 mg/dL — ABNORMAL HIGH (ref 70–99)
Glucose-Capillary: 148 mg/dL — ABNORMAL HIGH (ref 70–99)
Glucose-Capillary: 137 mg/dL — ABNORMAL HIGH (ref 70–99)
Glucose-Capillary: 107 mg/dL — ABNORMAL HIGH (ref 70–99)
Glucose-Capillary: 111 mg/dL — ABNORMAL HIGH (ref 70–99)
Glucose-Capillary: 103 mg/dL — ABNORMAL HIGH (ref 70–99)

## 2020-05-23 LAB — BASIC METABOLIC PANEL
Anion gap: 13 (ref 5–15)
BUN: 37 mg/dL — ABNORMAL HIGH (ref 8–23)
CO2: 24 mmol/L (ref 22–32)
Calcium: 8.6 mg/dL — ABNORMAL LOW (ref 8.9–10.3)
Chloride: 96 mmol/L — ABNORMAL LOW (ref 98–111)
Creatinine, Ser: 5.65 mg/dL — ABNORMAL HIGH (ref 0.44–1.00)
GFR, Estimated: 7 mL/min — ABNORMAL LOW (ref >60)
Glucose, Bld: 139 mg/dL — ABNORMAL HIGH (ref 70–99)
Potassium: 4 mmol/L (ref 3.5–5.1)
Sodium: 133 mmol/L — ABNORMAL LOW (ref 135–145)

## 2020-05-23 MED ORDER — DOXERCALCIFEROL 4 MCG/2ML IV SOLN
INTRAVENOUS | Status: AC
  Filled 2020-05-23: qty 2

## 2020-05-23 NOTE — TOC Progression Note (Signed)
Transition of Care [facility]) - Progression Note    Patient Details  Name: Mckynlee Luse MRN: [id number] Date of Birth: [birth year]  Transition of Care [facility]) CM/SW Contact  Joanne Chars, LCSW Phone Number: 05/23/2020, 8:35 AM  Clinical Narrative:   TC message from [place] at [facility]: pt does not meet criteria for LTAC admission at this time.         Expected Discharge Plan and Services                                                 Social Determinants of Health (SDOH) Interventions    Readmission Risk Interventions Readmission Risk Prevention Plan 03/01/2020  Transportation Screening Complete  Medication Review ([place]) Complete  PCP or Specialist appointment within 3-5 days of discharge Complete  HRI or Home Care Consult Complete  SW Recovery Care/Counseling Consult Complete  Palliative Care Screening Complete  Skilled Nursing Facility Complete  Some recent data might be hidden

## 2020-05-23 NOTE — Progress Notes (Signed)
Patient ID: Eudelia Hiltunen, female   DOB: Dec 27, 1950, 69 y.o.   MRN: [id number]  PROGRESS NOTE    Amilee Janvier  QMG:[id number] DOB: [birth year] DOA: 05/13/2020 PCP: [name], MD   Brief Narrative:  69 year old female, morbidly obese, with past medical history significant for ESRD on MWF HD, history of PE on Eliquis, insulin-dependent type 2 diabetes, hypertension, hyperlipidemia, morbid obesity and asthma who is wheelchair-bound was admitted with ruptured hematoma of the right lower leg.  She underwent evacuation of the hematoma, cauterization of bleeding areas and closure of the wound by general surgery.  Nephrology is following for dialysis needs.  Assessment & Plan:   Ruptured hematoma of the right lower extremity Occurred in the setting of anticoagulation use Status post evacuation of the hematoma, cauterization of the bleeding vessels and closure of the large wound by general surgery Wound care as per general surgery recommendations Eliquis on hold Transition to hospice after dialysis on 05/23/2020  Acute on chronic blood loss anemia Due to above Status post 2 units packed red cells transfusion  End-stage renal disease on hemodialysis Nephrology following Outpatient hemodialysis would not be possible with her low blood pressure and immobility as per nephrology Patient is going to be switched to hospice after last HD session on 05/23/2020  Chronic hypotension Continue midodrine  History of PE/DVT Eliquis on hold  Diabetes mellitus type 2 SSI, Lantus, Accu-Cheks, hypoglycemic protocol  Thrombocytopenia Ongoing  Morbid obesity/OSA/OHS Continue CPAP at bedtime.  Left ankle and pannus wounds Recent admission at [facility] from 04/11/2020-04/21/2020 requiring debridement of right flank and groin wounds on 04/14/2020.  On oral antibiotics as an outpatient Wound care team input is appreciated.  General surgery following  Left foot fractures X-ray of the left ankle is showing  bimalleolar fracture.  Orthopedics recommended conservative management with CAM boot and office follow-up  Generalized deconditioning Overall prognosis is very poor Palliative care following. CODE STATUS changed to DNR [facility] refused patient, due to poor prognosis and no hope of recovery After further discussion with family, agreed to switch over to residential hospice after dialysis session on 05/23/2020    DVT prophylaxis: SCDs Code Status: DNR Family Communication: None at bedside Disposition Plan: Residential hospice Status is: Inpatient  Remains inpatient appropriate because:Inpatient level of care appropriate due to severity of illness.    Dispo: The patient is from: Home              Anticipated d/c is to: Residential hospice              Anticipated d/c date is: 1 day              Patient currently is not medically stable to d/c.  Consultants: General surgery/nephrology.  Orthopedics, palliative care  Procedures: Evacuation of hematoma from right leg, cauterization of bleeding areas, closure of 20 cm of wounds on 05/14/2020 by general surgery  Antimicrobials:  Anti-infectives (From admission, onward)   Start     Dose/Rate Route Frequency Ordered Stop   05/14/20 2200  doxycycline (VIBRA-TABS) tablet 100 mg        100 mg Oral 2 times daily 05/14/20 1017 05/17/20 2150   05/14/20 1000  doxycycline (VIBRA-TABS) tablet 100 mg  Status:  Discontinued        100 mg Oral 2 times daily 05/13/20 2038 05/14/20 1017   05/14/20 1000  linezolid (ZYVOX) tablet 600 mg  Status:  Discontinued        600 mg Oral 2 times daily 05/13/20 2038  05/14/20 1017   05/13/20 1945  clindamycin (CLEOCIN) IVPB 900 mg        900 mg 100 mL/hr over 30 Minutes Intravenous On call to O.R. 05/13/20 1933 05/14/20 0040       Subjective: Patient seen and examined at bedside, very poor prognosis, unable to have a meaningful conversation.  Noted to be intermittently confused.     Objective: Vitals:     05/23/20 1221 05/23/20 1250 05/23/20 1305 05/23/20 1330  BP: (!) 88/53 126/74 (!) 125/39 (!) 92/40  Pulse: (!) 102 64 65 (!) 52  Resp: 16     Temp: 98.1 F (36.7 C) 97.9 F (36.6 C)    TempSrc: Oral Oral    SpO2: 99% 100%    Weight:      Height:       No intake or output data in the 24 hours ending 05/23/20 1448 Filed Weights   05/22/20 0102 05/22/20 2230 05/23/20 0111  Weight: (!) 0.03 kg (!) 0.03 kg (!) 0.03 kg    Examination:  General: NAD, chronically ill-appearing, intermittently confused  Cardiovascular: S1, S2 present  Respiratory:  Decreased breath sounds bilaterally  Abdomen: Soft, obese, nontender, nondistended, bowel sounds present  Musculoskeletal: Right lower extremity dressing present, bilateral pedal edema noted  Skin:  Noted ulcers  Psychiatry:  Very flat affect, low mood    Data Reviewed: I have personally reviewed following labs and imaging studies  CBC: Recent Labs  Lab 05/18/20 0013 05/19/20 0052 05/21/20 0123 05/22/20 0126 05/23/20 0407  WBC 12.4* 10.6* 10.6* 10.1 9.7  NEUTROABS 10.3* 9.5* 9.5* 8.9* 8.7*  HGB 7.8* 8.3* 8.2* 7.2* 7.7*  HCT 25.0* 27.3* 26.9* 23.6* 25.0*  MCV 98.0 98.9 97.8 97.5 96.2  PLT 127* 130* 173 PLATELET CLUMPS NOTED ON SMEAR, COUNT APPEARS ADEQUATE 510*   Basic Metabolic Panel: Recent Labs  Lab 05/18/20 0013 05/19/20 0052 05/21/20 0123 05/22/20 0126 05/23/20 0407  NA 130* 135 130* 135 133*  K 3.5 4.0 4.9 3.5 4.0  CL 92* 95* 93* 97* 96*  CO2 23 25 22 27 24   GLUCOSE 183* 149* 227* 170* 139*  BUN 39* 21 43* 26* 37*  CREATININE 5.46* 3.66* 5.88* 4.34* 5.65*  CALCIUM 8.9 8.8* 9.1 8.6* 8.6*   GFR: Estimated Creatinine Clearance: 0.1 mL/min (A) (by C-G formula based on SCr of 5.65 mg/dL (H)). Liver Function Tests: No results for input(s): AST, ALT, ALKPHOS, BILITOT, PROT, ALBUMIN in the last 168 hours. No results for input(s): LIPASE, AMYLASE in the last 168 hours. No results for input(s): AMMONIA in the  last 168 hours. Coagulation Profile: No results for input(s): INR, PROTIME in the last 168 hours. Cardiac Enzymes: No results for input(s): CKTOTAL, CKMB, CKMBINDEX, TROPONINI in the last 168 hours. BNP (last 3 results) No results for input(s): PROBNP in the last 8760 hours. HbA1C: No results for input(s): HGBA1C in the last 72 hours. CBG: Recent Labs  Lab 05/22/20 1939 05/22/20 2307 05/23/20 0312 05/23/20 1032 05/23/20 1219  GLUCAP 163* 137* 145* 148* 137*   Lipid Profile: No results for input(s): CHOL, HDL, LDLCALC, TRIG, CHOLHDL, LDLDIRECT in the last 72 hours. Thyroid Function Tests: No results for input(s): TSH, T4TOTAL, FREET4, T3FREE, THYROIDAB in the last 72 hours. Anemia Panel: No results for input(s): VITAMINB12, FOLATE, FERRITIN, TIBC, IRON, RETICCTPCT in the last 72 hours. Sepsis Labs: No results for input(s): PROCALCITON, LATICACIDVEN in the last 168 hours.  Recent Results (from the past 240 hour(s))  Respiratory Panel by RT PCR (Flu  A&B, Covid) - Nasopharyngeal Swab     Status: None   Collection Time: 05/13/20  5:07 PM   Specimen: Nasopharyngeal Swab  Result Value Ref Range Status   SARS Coronavirus 2 by RT PCR NEGATIVE NEGATIVE Final    Comment: (NOTE) SARS-CoV-2 target nucleic acids are NOT DETECTED.  The SARS-CoV-2 RNA is generally detectable in upper respiratoy specimens during the acute phase of infection. The lowest concentration of SARS-CoV-2 viral copies this assay can detect is 131 copies/mL. A negative result does not preclude SARS-Cov-2 infection and should not be used as the sole basis for treatment or other patient management decisions. A negative result may occur with  improper specimen collection/handling, submission of specimen other than nasopharyngeal swab, presence of viral mutation(s) within the areas targeted by this assay, and inadequate number of viral copies (<131 copies/mL). A negative result must be combined with  clinical observations, patient history, and epidemiological information. The expected result is Negative.  Fact Sheet for Patients:  PinkCheek.be  Fact Sheet for Healthcare Providers:  GravelBags.it  This test is no t yet approved or cleared by the [place] FDA and  has been authorized for detection and/or diagnosis of SARS-CoV-2 by FDA under an Emergency Use Authorization (EUA). This EUA will remain  in effect (meaning this test can be used) for the duration of the COVID-19 declaration under Section 564(b)(1) of the Act, 21 U.S.C. section 360bbb-3(b)(1), unless the authorization is terminated or revoked sooner.     Influenza A by PCR NEGATIVE NEGATIVE Final   Influenza B by PCR NEGATIVE NEGATIVE Final    Comment: (NOTE) The Xpert Xpress SARS-CoV-2/FLU/RSV assay is intended as an aid in  the diagnosis of influenza from Nasopharyngeal swab specimens and  should not be used as a sole basis for treatment. Nasal washings and  aspirates are unacceptable for Xpert Xpress SARS-CoV-2/FLU/RSV  testing.  Fact Sheet for Patients: PinkCheek.be  Fact Sheet for Healthcare Providers: GravelBags.it  This test is not yet approved or cleared by the [place] FDA and  has been authorized for detection and/or diagnosis of SARS-CoV-2 by  FDA under an Emergency Use Authorization (EUA). This EUA will remain  in effect (meaning this test can be used) for the duration of the  Covid-19 declaration under Section 564(b)(1) of the Act, 21  U.S.C. section 360bbb-3(b)(1), unless the authorization is  terminated or revoked. Performed at [facility], [place] [address], [place], [place] [address]          Radiology Studies: No results found.      Scheduled Meds: . Chlorhexidine Gluconate Cloth  6 each Topical Q0600  . Chlorhexidine Gluconate Cloth  6 each Topical  Q0600  . collagenase   Topical Daily  . doxercalciferol  2 mcg Intravenous Q M,W,F-HD  . insulin aspart  0-6 Units Subcutaneous Q4H  . insulin detemir  20 Units Subcutaneous Daily  . midodrine  10 mg Oral TID WC  . montelukast  10 mg Oral Daily  . pravastatin  40 mg Oral Daily   Continuous Infusions:        Alma Friendly, MD [facility] 05/23/2020, 2:48 PM

## 2020-05-23 NOTE — Progress Notes (Signed)
Daily Progress Note   Patient Name: Suzanne Levy       Date: 05/23/2020 DOB: 1950-09-05  Age: 69 y.o. MRN#: [id number] Attending Physician: Alma Friendly, MD Primary Care Physician: [name], MD Admit Date: 05/13/2020  Reason for Consultation/Follow-up: Goals of care/terminal care  Subjective: Patient wakes to voice. She does not remember our conversation yesterday with sister present. She complains of pain. RN planning to give PO oxy.   GOC:   F/u with SW, Greg this morning. Unfortunately, [facility] patient.   Attempted to update patient of this information. Again, she does not remember our conversation yesterday. She can verbalize to me her understanding that when dialysis is discontinued she will "die" but otherwise does not seem to understand complexity of her condition. Flat affect and continues to ask about her options. Frankly and compassionately expressed that there are no other options besides transition to hospice facility. Attempted education on hospice facility and philosophy.   No family at bedside. Spoke with sisters [name] and [name]) via telephone. Provided update on LTACH decline. Sisters understand and have been preparing for this. They understand Maddilynn's medical diagnoses, inability to tolerate outpatient dialysis, and recommendation for transition to comfort and hospice facility placement. Discussed hospice facility and philosophy. Sisters would like for her final dialysis to be attempted today to have 'peace of mind' and then ready for transition to comfort in the hospital. [name] plans to come from [facility] tomorrow. [name] is local. Answered questions and concerns. Sisters have PMT contact information.   Length of  Stay: 10   Vital Signs: BP (!) 95/46 (BP Location: Right Arm)   Pulse 97   Temp 98.3 F (36.8 C)   Resp (!) 22   Ht 5\' 11"  (1.803 m)   Wt (!) 0.03 kg   LMP  (LMP Unknown)   SpO2 100%   BMI 0.01 kg/m  SpO2: SpO2: 100 % O2 Device: O2 Device: Nasal Cannula O2 Flow Rate: O2 Flow Rate (L/min): 2 L/min       Palliative Assessment/Data: 30%     Palliative Care Plan    Patient Profile/HPI:  69 y.o. female  with past medical history of legally blind, ESRD on HD, history of PE on Eliquis, IDDM 2, HTN/HLD, asthma, morbid obesity/wheelchair-bound admitted on 05/13/2020 with ruptured  hematoma of the right lower leg after being hit by the automatic door at dialysis on 10/1.   Went to OR for surgical clean out and suture of wound. Required blood transfusion. Overall poor prognosis. Outpatient HD not possible with hypotension and immobility. ? LTACH for ongoing dialysis. Awaiting SW follow-up. Palliative medicine consultation for goals of care.   Assessment: ESRD on hemodialysis Ruptured hematoma of RLE Acute blood loss anemia Chronic hypotension Immobility and generalized deconditioning Hyperkalemia Hx of PE/DVT DM type 2 Left foot fracture Left ankle and pannus wounds Morbid obesity OSA/OHS  Recommendations/Plan:  DNR/DNI  Final hemodialysis attempt today, 10/13.  LTACH has unfortunately declined. Patient is not a candidate for outpatient hemodialysis with hypotension and immobility. Updated sisters who agree with transition to comfort focused care after final dialysis and hospice facility placement. TOC team notified.   Start unrestricted visitor access.  Comfort feeds per patient/family request.  Continue current pain regimen. Sister coming from [facility] tomorrow and will likely liberalize comfort medications once family has visited.   PMT will continue to follow inpatient.   Code Status:  DNR  Prognosis:  Poor prognosis likely <2 weeks with transition to comfort  measures and discontinuation of hemodialysis.   Discharge Planning:  Hospice facility  Care plan was discussed with patient, sisters, RN, SW, Dr. Horris Latino, Dr. Justin Mend  Thank you for allowing the Palliative Medicine Team to assist in the care of this patient.  Total time spent:  45  Greater than 50% of this time was spent counseling and coordinating care related to the above assessment and plan.   Ihor Dow, DNP, FNP-C Palliative Medicine Team  Phone: [phone] Fax: [phone]  Please contact Palliative MedicineTeam phone at [phone] for questions and concerns between 7 am - 7 pm.   Please see AMION for individual provider pager numbers.

## 2020-05-23 NOTE — Progress Notes (Signed)
Renal Navigator spoke with Palliative Care NP/M. Mason and understands plan for last HD today and then transition to comfort care. Navigator updated patient's outpatient HD clinic.  Alphonzo Cruise, [place] Renal Navigator [phone]

## 2020-05-23 NOTE — Progress Notes (Signed)
[facility] ROUNDING NOTE   Subjective:   Brief history: 69 year old female morbid obesity end-stage renal disease Monday Wednesday Friday history of pulmonary embolus on Eliquis insulin-dependent diabetes mellitus type 2, hypertension hyperlipidemia asthma admitted with ruptured hematoma right lower leg.  Status post evacuation of hematoma cauterization of bleeding area and closure of wound by general surgery 05/14/2020. Tolerated dialysis 2 L removed 05/21/2020 next dialysis treatment be 05/23/2020  Patient has been seen and assessed by palliative medicine appreciate assistance.  Possibility of patient being accepted at [facility] for management of wounds  Blood pressure blood pressure 95/46 pulse 104 temperature 100.5 O2 sats 100% 2 L nasal cannula  Sodium 133 potassium 4 chloride 96 CO2 24 BUN 37 creatinine 5.65 glucose 139 calcium 8.6 hemoglobin 7.7  Hectorol 2 mcg Monday Wednesday Friday, insulin sliding scale, Levemir 20 units daily, midodrine 10 mg 3 times daily singular 10 mg daily pravastatin 40 mg daily    Objective:  Vital signs in last 24 hours:  Temp:  [98.1 F (36.7 C)-100.5 F (38.1 C)] 100.5 F (38.1 C) (10/12 1900) Pulse Rate:  [89-104] 89 (10/12 1900) Resp:  [20-23] 22 (10/12 1900) BP: (81-112)/(46-93) 95/46 (10/12 1900) SpO2:  [100 %] 100 % (10/12 1900) Weight:  [0.03 kg] 0.03 kg (10/13 0111)  Weight change: 0 kg Filed Weights   05/22/20 0102 05/22/20 2230 05/23/20 0111  Weight: (!) 0.03 kg (!) 0.03 kg (!) 0.03 kg    Intake/Output: I/O last 3 completed shifts: In: 240 [P.O.:240] Out: 2000 [Other:2000]   Intake/Output this shift:  No intake/output data recorded.  General:Obese female, nad Lungs: Clear, anteriorly, diminished breath sounds.Breathing is unlabored. Heart:RRR with S1 S2 Abdomen: softobese, non-tender Lower extremities:R leg in bandage;mild-mod bilat pitting LE edema  Neuro: A &O X 3. Moves  all extremities spontaneously. Dialysis Access:LUEAVG+bruit; some bruising to LUE   Basic Metabolic Panel: Recent Labs  Lab 05/18/20 0013 05/18/20 0013 05/19/20 0052 05/19/20 0052 05/21/20 0123 05/22/20 0126 05/23/20 0407  NA 130*  --  135  --  130* 135 133*  K 3.5  --  4.0  --  4.9 3.5 4.0  CL 92*  --  95*  --  93* 97* 96*  CO2 23  --  25  --  22 27 24   GLUCOSE 183*  --  149*  --  227* 170* 139*  BUN 39*  --  21  --  43* 26* 37*  CREATININE 5.46*  --  3.66*  --  5.88* 4.34* 5.65*  CALCIUM 8.9   < > 8.8*   < > 9.1 8.6* 8.6*   < > = values in this interval not displayed.    Liver Function Tests: No results for input(s): AST, ALT, ALKPHOS, BILITOT, PROT, ALBUMIN in the last 168 hours. No results for input(s): LIPASE, AMYLASE in the last 168 hours. No results for input(s): AMMONIA in the last 168 hours.  CBC: Recent Labs  Lab 05/18/20 0013 05/19/20 0052 05/21/20 0123 05/22/20 0126 05/23/20 0407  WBC 12.4* 10.6* 10.6* 10.1 9.7  NEUTROABS 10.3* 9.5* 9.5* 8.9* 8.7*  HGB 7.8* 8.3* 8.2* 7.2* 7.7*  HCT 25.0* 27.3* 26.9* 23.6* 25.0*  MCV 98.0 98.9 97.8 97.5 96.2  PLT 127* 130* 173 PLATELET CLUMPS NOTED ON SMEAR, COUNT APPEARS ADEQUATE 120*    Cardiac Enzymes: No results for input(s): CKTOTAL, CKMB, CKMBINDEX, TROPONINI in the last 168 hours.  BNP: Invalid input(s): POCBNP  CBG: Recent Labs  Lab 05/22/20 1213 05/22/20 1612  05/22/20 1939 05/22/20 2307 05/23/20 0312  GLUCAP 152* 148* 163* 137* 145*    Microbiology: Results for orders placed or performed during the hospital encounter of 05/13/20  Respiratory Panel by RT PCR (Flu A&B, Covid) - Nasopharyngeal Swab     Status: None   Collection Time: 05/13/20  5:07 PM   Specimen: Nasopharyngeal Swab  Result Value Ref Range Status   SARS Coronavirus 2 by RT PCR NEGATIVE NEGATIVE Final    Comment: (NOTE) SARS-CoV-2 target nucleic acids are NOT DETECTED.  The SARS-CoV-2 RNA is generally detectable in upper  respiratoy specimens during the acute phase of infection. The lowest concentration of SARS-CoV-2 viral copies this assay can detect is 131 copies/mL. A negative result does not preclude SARS-Cov-2 infection and should not be used as the sole basis for treatment or other patient management decisions. A negative result may occur with  improper specimen collection/handling, submission of specimen other than nasopharyngeal swab, presence of viral mutation(s) within the areas targeted by this assay, and inadequate number of viral copies (<131 copies/mL). A negative result must be combined with clinical observations, patient history, and epidemiological information. The expected result is Negative.  Fact Sheet for Patients:  PinkCheek.be  Fact Sheet for Healthcare Providers:  GravelBags.it  This test is no t yet approved or cleared by the [place] FDA and  has been authorized for detection and/or diagnosis of SARS-CoV-2 by FDA under an Emergency Use Authorization (EUA). This EUA will remain  in effect (meaning this test can be used) for the duration of the COVID-19 declaration under Section 564(b)(1) of the Act, 21 U.S.C. section 360bbb-3(b)(1), unless the authorization is terminated or revoked sooner.     Influenza A by PCR NEGATIVE NEGATIVE Final   Influenza B by PCR NEGATIVE NEGATIVE Final    Comment: (NOTE) The Xpert Xpress SARS-CoV-2/FLU/RSV assay is intended as an aid in  the diagnosis of influenza from Nasopharyngeal swab specimens and  should not be used as a sole basis for treatment. Nasal washings and  aspirates are unacceptable for Xpert Xpress SARS-CoV-2/FLU/RSV  testing.  Fact Sheet for Patients: PinkCheek.be  Fact Sheet for Healthcare Providers: GravelBags.it  This test is not yet approved or cleared by the [place] FDA and  has been  authorized for detection and/or diagnosis of SARS-CoV-2 by  FDA under an Emergency Use Authorization (EUA). This EUA will remain  in effect (meaning this test can be used) for the duration of the  Covid-19 declaration under Section 564(b)(1) of the Act, 21  U.S.C. section 360bbb-3(b)(1), unless the authorization is  terminated or revoked. Performed at [facility], [place] [address] [place], [place] [address]     Coagulation Studies: No results for input(s): LABPROT, INR in the last 72 hours.  Urinalysis: No results for input(s): COLORURINE, LABSPEC, PHURINE, GLUCOSEU, HGBUR, BILIRUBINUR, KETONESUR, PROTEINUR, UROBILINOGEN, NITRITE, LEUKOCYTESUR in the last 72 hours.  Invalid input(s): APPERANCEUR    Imaging: No results found.   Medications:    . Chlorhexidine Gluconate Cloth  6 each Topical Q0600  . Chlorhexidine Gluconate Cloth  6 each Topical Q0600  . collagenase   Topical Daily  . doxercalciferol  2 mcg Intravenous Q M,W,F-HD  . insulin aspart  0-6 Units Subcutaneous Q4H  . insulin detemir  20 Units Subcutaneous Daily  . midodrine  10 mg Oral TID WC  . montelukast  10 mg Oral Daily  . pravastatin  40 mg Oral Daily   acetaminophen **OR** acetaminophen, albuterol, hydrOXYzine, ondansetron **  OR** ondansetron (ZOFRAN) IV, oxyCODONE, traMADol  Assessment/ Plan:  Outpatient hemodialysis: [facility] Monday Wednesday Friday   4h 45min 500/800   156kg  2K/2.25Ca   L AVG   Heparin none Hectorol 2  Venofer 50 q week Mircera 225 (last 9/27)  1. Traumatic hematoma of RLE - s/p surgical evacuation 10/4. Being f/b gen surgery. Lots of leg pain. Appreciate gen surg assistance.  2. ESRD -HD MWF.   HD 05/21/2020.  Tolerated dialysis 05/21/2020 with 2 L removed.  Next dialysis treatment will be 10/13/2021no heparin  3. Volume- Chronic hypotension on midodrine. Significant LE edema unable to pull volume due to BP drops on HD. Midodrine started here at 10 mg tid.  Palliative  medicine involved due to poor prognosis  4. Anemia - Post op blood loss noted. S/p 2 u prbcs 10/3.  Recent ESAdose as outpatient. Hb 7- 8 range here.  5. Metabolic bone disease -Ca/phos ok.  Continue binders/Hectorol. 6. Nutrition -Renal diet/vitalmins 7. Hx PE- Holding Eliquis 8. H/o blindness 9. IDDM - per primary 10. Morbid obesity/ debility / WC dependent  11. Prognosis - pt is now DNR, appreciate [facility] and palliative assistance. LTAC is one option, awaiting response. OP HD would not be possible w/ her low BP's and immobility. Prognosis appears quite poor.  Options may be to discharge to [facility]   LOS: [place] @TODAY @6 :27 AM

## 2020-05-23 NOTE — Progress Notes (Signed)
[facility] Progress Note  10 Days Post-Op  Subjective: Patient sleeping but easily awakened. Reports pain in RLE and pannus. Willing to allow me to examine wound on RLE. Refuses to allow me to examine pannus. Refuses to allow me to speak with any family members.  Objective: Vital signs in last 24 hours: Temp:  [98.1 F (36.7 C)-100.5 F (38.1 C)] 98.3 F (36.8 C) (10/12 2000) Pulse Rate:  [89-104] 97 (10/12 2000) Resp:  [20-22] 22 (10/12 2000) BP: (95-112)/(46-93) 95/46 (10/12 1900) SpO2:  [100 %] 100 % (10/12 2000) Weight:  [0.03 kg] 0.03 kg (10/13 0111) Last BM Date: 05/22/20  Intake/Output from previous day: No intake/output data recorded. Intake/Output this shift: No intake/output data recorded.  PE: General: WD, morbidly obese female who is laying in bed and lethargic but easily arouses Abd: soft, ttp over R pannus, unable to examine skin Ext: RLE wound as seen below with necrotic tissue sloughing, wound is malodorous, R foot insensate on plantar aspect but she does have sensation dorsally. Motor function of R toes intact. R DP pulse is 1+      Lab Results:  Recent Labs    05/22/20 0126 05/23/20 0407  WBC 10.1 9.7  HGB 7.2* 7.7*  HCT 23.6* 25.0*  PLT PLATELET CLUMPS NOTED ON SMEAR, COUNT APPEARS ADEQUATE 120*   BMET Recent Labs    05/22/20 0126 05/23/20 0407  NA 135 133*  K 3.5 4.0  CL 97* 96*  CO2 27 24  GLUCOSE 170* 139*  BUN 26* 37*  CREATININE 4.34* 5.65*  CALCIUM 8.6* 8.6*   PT/INR No results for input(s): LABPROT, INR in the last 72 hours. CMP     Component Value Date/Time   NA 133 (L) 05/23/2020 0407   K 4.0 05/23/2020 0407   CL 96 (L) 05/23/2020 0407   CO2 24 05/23/2020 0407   GLUCOSE 139 (H) 05/23/2020 0407   BUN 37 (H) 05/23/2020 0407   CREATININE 5.65 (H) 05/23/2020 0407   CALCIUM 8.6 (L) 05/23/2020 0407   PROT 7.7 04/04/2020 1626   ALBUMIN 2.3 (L) 05/15/2020 0039   AST 15 04/04/2020 1626   ALT 15 04/04/2020 1626     ALKPHOS 132 (H) 04/04/2020 1626   BILITOT 0.4 04/04/2020 1626   GFRNONAA 7 (L) 05/23/2020 0407   GFRAA 11 (L) 05/15/2020 0039   Lipase     Component Value Date/Time   LIPASE 25 04/04/2020 1626       Studies/Results: No results found.  Anti-infectives: Anti-infectives (From admission, onward)   Start     Dose/Rate Route Frequency Ordered Stop   05/14/20 2200  doxycycline (VIBRA-TABS) tablet 100 mg        100 mg Oral 2 times daily 05/14/20 1017 05/17/20 2150   05/14/20 1000  doxycycline (VIBRA-TABS) tablet 100 mg  Status:  Discontinued        100 mg Oral 2 times daily 05/13/20 2038 05/14/20 1017   05/14/20 1000  linezolid (ZYVOX) tablet 600 mg  Status:  Discontinued        600 mg Oral 2 times daily 05/13/20 2038 05/14/20 1017   05/13/20 1945  clindamycin (CLEOCIN) IVPB 900 mg        900 mg 100 mL/hr over 30 Minutes Intravenous On call to O.R. 05/13/20 1933 05/14/20 0040       Assessment/Plan ESRD - HD MWF Hypotension HLD Asthma T2DM Morbid obesity BMI 49 H/o PE/DVT on eliquis ABL anemia - H/H 7.7/25, stable  Hyponatremia -  Na 133 this AM  Pannus wound - Patient refused for me to examine and has refused [place] twice now. If patient agrees to [place] of pannus, please let general surgery know if abscess is identified and patient agreeable to I&D  Ruptured hematoma Non-healing underlying proximal tibial metaphyseal fracture s/p screw fixation -S/pevacuation of hematoma from right leg, cauterization of bleeding areas, closure of 20 cm of wounds10/4 Dr. Kieth Brightly -POD#9 - Skin flap now appears necrotic - at this point I would ask ortho to re-evaluate RLE, may need amputation vs further debridement  - I think this is past the scope of general surgery at this time  ID -doxycycline 10/4-10/7; linezolid 10/4 x 1; cleocin 10/3-10/4 FEN -CM/renal diet VTE -SCD Foley -none  General surgery will sign off, please re-consult if we can be of further assistance.   LOS: 10  days    Norm Parcel , [facility] 05/23/2020, 8:12 AM Please see Amion for pager number during day hours 7:00am-4:30pm

## 2020-05-23 NOTE — TOC Initial Note (Signed)
Transition of Care [facility]) - Initial/Assessment Note    Patient Details  Name: Suzanne Levy MRN: [id number] Date of Birth: Dec 10, 1950  [facility]) CM/SW Contact:    Joanne Chars, LCSW Phone Number: 05/23/2020, 9:31 AM  Clinical Narrative:   CSW spoke with pt regarding her meeting with palliative care and discussions regarding goals of care.  Permission given to speak with sister [name].  Pt is vaccinated.  PCP in place.                  Expected Discharge Plan:  (unsure-palliative working with famiy on goals of care) Barriers to Discharge: Continued Medical Work up   Patient Goals and CMS Choice Patient states their goals for this hospitalization and ongoing recovery are:: "not sure"      Expected Discharge Plan and Services Expected Discharge Plan:  (unsure-palliative working with famiy on goals of care)       Living arrangements for the past 2 months: Single Family Home                                      Prior Living Arrangements/Services Living arrangements for the past 2 months: Single Family Home Lives with:: Friends Patient language and need for interpreter reviewed:: Yes Do you feel safe going back to the place where you live?: Yes      Need for Family Participation in Patient Care: Yes (Comment) Care giver support system in place?: Yes (comment) Current home services:  (pt reports HH, can't remember which agency) Criminal Activity/Legal Involvement Pertinent to Current Situation/Hospitalization: No - Comment as needed  Activities of Daily Living      Permission Sought/Granted Permission sought to share information with : Family Supports Permission granted to share information with : Yes, Verbal Permission Granted  Share Information with NAME: sister [name]           Emotional Assessment Appearance:: Appears stated age Attitude/Demeanor/Rapport: Lethargic Affect (typically observed): Appropriate Orientation: :  Oriented to Self, Oriented to Place, Oriented to  Time, Oriented to Situation Alcohol / Substance Use: Not Applicable Psych Involvement: No (comment)  Admission diagnosis:  Acute blood loss anemia [D62] Hematoma of left lower extremity, initial encounter [S80.12XA] Traumatic hematoma of right lower leg, initial encounter [S80.11XA] Patient Active Problem List   Diagnosis Date Noted  . Chronic hypotension   . Morbid obesity ([place])   . Wound infection   . Goals of care, counseling/discussion   . Palliative care by specialist   . DNR (do not resuscitate) discussion   . Bimalleolar ankle fracture, left, closed, initial encounter 05/15/2020  . Pressure injury of skin 05/14/2020  . Hematoma of left lower extremity   . Traumatic hematoma of right lower leg 05/13/2020  . History of pulmonary embolus (PE) 05/13/2020  . Acute blood loss anemia 05/13/2020  . Insulin dependent type 2 diabetes mellitus ([place]) 05/13/2020  . Hyperlipidemia associated with type 2 diabetes mellitus ([place]) 05/13/2020  . 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 Hyperkalemia 02/29/2020  . Obesity, Class III, BMI 40-49.9 (morbid obesity) ([place]) 02/29/2020  . Hyperglycemia due to diabetes mellitus ([place]) 02/29/2020  . Osteopenia 02/29/2020  . Macrocytic anemia 02/29/2020  . Abrasion of right knee 02/29/2020  . Asthma 02/29/2020  . ESRD (end stage renal disease) ([place]) 02/29/2020  . Pulmonary embolism ([place]) 02/29/2020  . Fibula fracture 02/29/2020  . Right tibial fracture 02/28/2020  PCP:  [name], MD Pharmacy:   [id number] - [place], [place] [address]  [place] [address] Phone: [phone] Fax: [phone]     Social Determinants of Health (SDOH) Interventions    Readmission Risk Interventions Readmission Risk Prevention Plan 03/01/2020  Transportation Screening Complete  Medication Review ([place]) Complete  PCP or Specialist appointment within 3-5 days of discharge Complete  HRI or Home Care Consult Complete  SW Recovery Care/Counseling Consult Complete  Palliative Care Screening Complete  Skilled Nursing Facility Complete  Some recent data might be hidden

## 2020-05-23 NOTE — Plan of Care (Signed)
  Problem: Education: Goal: Knowledge of General Education information will improve Description: Including pain rating scale, medication(s)/side effects and non-pharmacologic comfort measures Outcome: Not Progressing   Problem: Health Behavior/Discharge Planning: Goal: Ability to manage health-related needs will improve Outcome: Not Progressing   Problem: Clinical Measurements: Goal: Ability to maintain clinical measurements within normal limits will improve Outcome: Progressing Goal: Will remain free from infection Outcome: Not Progressing Goal: Diagnostic test results will improve Outcome: Progressing Goal: Respiratory complications will improve Outcome: Progressing Goal: Cardiovascular complication will be avoided Outcome: Progressing   Problem: Elimination: Goal: Will not experience complications related to bowel motility Outcome: Progressing Goal: Will not experience complications related to urinary retention Outcome: Progressing   Problem: Pain Managment: Goal: General experience of comfort will improve Outcome: Progressing   Problem: Safety: Goal: Ability to remain free from injury will improve Outcome: Progressing

## 2020-05-23 NOTE — Progress Notes (Signed)
Patient declined CPAP at this time. Currently on 2L. 97% Spo2

## 2020-05-24 DIAGNOSIS — R52 Pain, unspecified: Secondary | ICD-10-CM

## 2020-05-24 LAB — GLUCOSE, CAPILLARY
Glucose-Capillary: 92 mg/dL (ref 70–99)
Glucose-Capillary: 95 mg/dL (ref 70–99)

## 2020-05-24 MED ORDER — FENTANYL CITRATE (PF) 100 MCG/2ML IJ SOLN
100.0000 ug | INTRAMUSCULAR | Status: DC | PRN
  Administered 2020-05-24: 100 ug via INTRAVENOUS
  Filled 2020-05-24: qty 2

## 2020-05-24 MED ORDER — GLYCOPYRROLATE 0.2 MG/ML IJ SOLN: 0.2000 mg | INTRAMUSCULAR | Status: DC | PRN

## 2020-05-24 MED ORDER — OXYCODONE HCL 5 MG PO TABS
5.0000 mg | ORAL_TABLET | ORAL | Status: DC | PRN
  Administered 2020-05-24: 5 mg via ORAL
  Filled 2020-05-24: qty 1

## 2020-05-24 MED ORDER — DIPHENHYDRAMINE HCL 50 MG/ML IJ SOLN: 25.0000 mg | Freq: Three times a day (TID) | INTRAMUSCULAR | Status: DC | PRN

## 2020-05-24 MED ORDER — LORAZEPAM 2 MG/ML IJ SOLN
0.5000 mg | INTRAMUSCULAR | Status: DC | PRN
  Administered 2020-05-25: 0.5 mg via INTRAVENOUS
  Filled 2020-05-24: qty 1

## 2020-05-24 MED ORDER — FENTANYL 25 MCG/HR TD PT72
1.0000 | MEDICATED_PATCH | TRANSDERMAL | Status: DC
  Administered 2020-05-24: 1 via TRANSDERMAL
  Filled 2020-05-24: qty 1

## 2020-05-24 MED ORDER — HYDROMORPHONE HCL 1 MG/ML IJ SOLN
0.5000 mg | INTRAMUSCULAR | Status: DC | PRN
  Administered 2020-05-25 (×4): 0.5 mg via INTRAVENOUS
  Filled 2020-05-24 (×4): qty 1

## 2020-05-24 NOTE — Progress Notes (Signed)
Patient assessed.  Reviewed LPN documentation, and agree with the findings.  

## 2020-05-24 NOTE — Progress Notes (Signed)
Patient ID: Suzanne Levy, female   DOB: [birth year], 69 y.o.   MRN: [id number]  PROGRESS NOTE    Towana Stenglein  YQI:[id number] DOB: October 25, 1950 DOA: 05/13/2020 PCP: [name], MD   Brief Narrative:  69 year old female, morbidly obese, with past medical history significant for ESRD on MWF HD, history of PE on Eliquis, insulin-dependent type 2 diabetes, hypertension, hyperlipidemia, morbid obesity and asthma who is wheelchair-bound was admitted with ruptured hematoma of the right lower leg.  She underwent evacuation of the hematoma, cauterization of bleeding areas and closure of the wound by general surgery.  Due to further decline, patient was eventually transitioned to hospice and comfort care.  Assessment & Plan:   Ruptured hematoma of the right lower extremity Occurred in the setting of anticoagulation use Status post evacuation of the hematoma, cauterization of the bleeding vessels and closure of the large wound by general surgery Wound care as per general surgery recommendations Eliquis on hold Transitioned to hospice and comfort care after dialysis on 05/23/2020  Acute on chronic blood loss anemia Due to above Status post 2 units packed red cells transfusion  End-stage renal disease on hemodialysis Outpatient hemodialysis would not be possible with her low blood pressure and immobility as per nephrology Patient switched to hospice with comfort care after last HD session on 05/23/2020  Chronic hypotension  History of PE/DVT Discontinue Eliquis  Diabetes mellitus type 2  Thrombocytopenia  Morbid obesity/OSA/OHS  Left ankle and pannus wounds Recent admission at [facility] from 04/11/2020-04/21/2020 requiring debridement of right flank and groin wounds on 04/14/2020  Left foot fractures X-ray of the left ankle is showing bimalleolar fracture.  Orthopedics recommended conservative management with CAM boot and office follow-up  Generalized deconditioning Overall prognosis is very  poor Palliative care following. CODE STATUS changed to DNR [facility] refused patient, due to poor prognosis and no hope of recovery After further discussion with family, agreed to switch over to residential hospice/comfort care    DVT prophylaxis: SCDs Code Status: DNR Family Communication: None at bedside Disposition Plan: Residential hospice Status is: Inpatient  Remains inpatient appropriate because:Inpatient level of care appropriate due to severity of illness.    Dispo: The patient is from: Home              Anticipated d/c is to: Residential hospice              Anticipated d/c date is: 1 day              Patient currently is medically stable to d/c.  Consultants: General surgery/nephrology.  Orthopedics, palliative care  Procedures: Evacuation of hematoma from right leg, cauterization of bleeding areas, closure of 20 cm of wounds on 05/14/2020 by general surgery  Antimicrobials:  Anti-infectives (From admission, onward)   Start     Dose/Rate Route Frequency Ordered Stop   05/14/20 2200  doxycycline (VIBRA-TABS) tablet 100 mg        100 mg Oral 2 times daily 05/14/20 1017 05/17/20 2150   05/14/20 1000  doxycycline (VIBRA-TABS) tablet 100 mg  Status:  Discontinued        100 mg Oral 2 times daily 05/13/20 2038 05/14/20 1017   05/14/20 1000  linezolid (ZYVOX) tablet 600 mg  Status:  Discontinued        600 mg Oral 2 times daily 05/13/20 2038 05/14/20 1017   05/13/20 1945  clindamycin (CLEOCIN) IVPB 900 mg        900 mg 100 mL/hr over 30 Minutes Intravenous On  call to O.R. 05/13/20 1933 05/14/20 0040       Subjective: Patient seen and examined at bedside, appeared comfortable     Objective: Vitals:   05/23/20 2200 05/23/20 2300 05/24/20 0103 05/24/20 0721  BP:    (!) 134/31  Pulse: 96 95  (!) 109  Resp: (!) 21 20  (!) 24  Temp:    100 F (37.8 C)  TempSrc:    Axillary  SpO2: 100% 100%  99%  Weight:   (!) 0.03 kg   Height:        Intake/Output Summary  (Last 24 hours) at 05/24/2020 1608 Last data filed at 05/24/2020 1258 Gross per 24 hour  Intake 160 ml  Output 2500 ml  Net -2340 ml   Filed Weights   05/23/20 0111 05/23/20 2142 05/24/20 0103  Weight: (!) 0.03 kg (!) 0.03 kg (!) 0.03 kg    Examination:  General: NAD, chronically ill-appearing  Cardiovascular: S1, S2 present  Respiratory:  Decreased breath sounds bilaterally  Abdomen: Soft, obese, nontender, nondistended, bowel sounds present  Musculoskeletal: Right lower extremity dressing present, bilateral pedal edema noted  Skin:  Noted ulcers  Psychiatry:  Very flat affect, low mood    Data Reviewed: I have personally reviewed following labs and imaging studies  CBC: Recent Labs  Lab 05/18/20 0013 05/19/20 0052 05/21/20 0123 05/22/20 0126 05/23/20 0407  WBC 12.4* 10.6* 10.6* 10.1 9.7  NEUTROABS 10.3* 9.5* 9.5* 8.9* 8.7*  HGB 7.8* 8.3* 8.2* 7.2* 7.7*  HCT 25.0* 27.3* 26.9* 23.6* 25.0*  MCV 98.0 98.9 97.8 97.5 96.2  PLT 127* 130* 173 PLATELET CLUMPS NOTED ON SMEAR, COUNT APPEARS ADEQUATE 856*   Basic Metabolic Panel: Recent Labs  Lab 05/18/20 0013 05/19/20 0052 05/21/20 0123 05/22/20 0126 05/23/20 0407  NA 130* 135 130* 135 133*  K 3.5 4.0 4.9 3.5 4.0  CL 92* 95* 93* 97* 96*  CO2 23 25 22 27 24   GLUCOSE 183* 149* 227* 170* 139*  BUN 39* 21 43* 26* 37*  CREATININE 5.46* 3.66* 5.88* 4.34* 5.65*  CALCIUM 8.9 8.8* 9.1 8.6* 8.6*   GFR: Estimated Creatinine Clearance: 0.1 mL/min (A) (by C-G formula based on SCr of 5.65 mg/dL (H)). Liver Function Tests: No results for input(s): AST, ALT, ALKPHOS, BILITOT, PROT, ALBUMIN in the last 168 hours. No results for input(s): LIPASE, AMYLASE in the last 168 hours. No results for input(s): AMMONIA in the last 168 hours. Coagulation Profile: No results for input(s): INR, PROTIME in the last 168 hours. Cardiac Enzymes: No results for input(s): CKTOTAL, CKMB, CKMBINDEX, TROPONINI in the last 168 hours. BNP  (last 3 results) No results for input(s): PROBNP in the last 8760 hours. HbA1C: No results for input(s): HGBA1C in the last 72 hours. CBG: Recent Labs  Lab 05/23/20 1807 05/23/20 1943 05/23/20 2311 05/24/20 0301 05/24/20 0719  GLUCAP 107* 111* 103* 92 95   Lipid Profile: No results for input(s): CHOL, HDL, LDLCALC, TRIG, CHOLHDL, LDLDIRECT in the last 72 hours. Thyroid Function Tests: No results for input(s): TSH, T4TOTAL, FREET4, T3FREE, THYROIDAB in the last 72 hours. Anemia Panel: No results for input(s): VITAMINB12, FOLATE, FERRITIN, TIBC, IRON, RETICCTPCT in the last 72 hours. Sepsis Labs: No results for input(s): PROCALCITON, LATICACIDVEN in the last 168 hours.  Recent Results (from the past 240 hour(s))  Culture, blood (routine x 2)     Status: None (Preliminary result)   Collection Time: 05/23/20  8:31 AM   Specimen: BLOOD RIGHT HAND  Result Value Ref  Range Status   Specimen Description BLOOD RIGHT HAND  Final   Special Requests   Final    BOTTLES DRAWN AEROBIC AND ANAEROBIC Blood Culture adequate volume   Culture   Final    NO GROWTH 1 DAY Performed at [facility], [address], [place], [place] [address]    Report Status PENDING  Incomplete  Culture, blood (routine x 2)     Status: None (Preliminary result)   Collection Time: 05/23/20  8:51 AM   Specimen: BLOOD RIGHT HAND  Result Value Ref Range Status   Specimen Description BLOOD RIGHT HAND  Final   Special Requests   Final    BOTTLES DRAWN AEROBIC ONLY Blood Culture adequate volume   Culture   Final    NO GROWTH 1 DAY Performed at [facility], [place] [address] [place], [place] [address]    Report Status PENDING  Incomplete         Radiology Studies: No results found.      Scheduled Meds: . Chlorhexidine Gluconate Cloth  6 each Topical Q0600  . Chlorhexidine Gluconate Cloth  6 each Topical Q0600  . collagenase   Topical Daily  . midodrine  10 mg Oral TID WC   Continuous  Infusions:        Alma Friendly, MD [facility] 05/24/2020, 4:08 PM

## 2020-05-24 NOTE — Progress Notes (Signed)
Declined CPAP for HS use.

## 2020-05-24 NOTE — Progress Notes (Signed)
Daily Progress Note   Patient Name: Suzanne Levy       Date: 05/24/2020 DOB: 1951-03-08  Age: 69 y.o. MRN#: [id number] Attending Physician: Alma Friendly, MD Primary Care Physician: [name], MD Admit Date: 05/13/2020  Reason for Consultation/Follow-up: Goals of care/terminal care  Subjective: Patient resting this morning. No s/s of discomfort. No family at bedside.    GOC:   Sisters plan to visit this afternoon and to meet with hospice liaison. Final hemodialysis yesterday with plans to transition to comfort measures and hospice facility placement.   ADDENDUM 1645: Medication adjustments made by request of hospice liaison for patient's ongoing pain despite prn fentanyl and oxycodone. Add fentanyl TD and Dilaudid IV.   Length of Stay: 11   Vital Signs: BP (!) 134/31   Pulse (!) 109   Temp 100 F (37.8 C) (Axillary)   Resp (!) 24   Ht 5\' 11"  (1.803 m)   Wt (!) 0.03 kg   LMP  (LMP Unknown)   SpO2 99%   BMI 0.01 kg/m  SpO2: SpO2: 99 % O2 Device: O2 Device: Nasal Cannula O2 Flow Rate: O2 Flow Rate (L/min): 4 L/min       Palliative Assessment/Data: 30%     Palliative Care Plan    Patient Profile/HPI:  69 y.o. female  with past medical history of legally blind, ESRD on HD, history of PE on Eliquis, IDDM 2, HTN/HLD, asthma, morbid obesity/wheelchair-bound admitted on 05/13/2020 with ruptured hematoma of the right lower leg after being hit by the automatic door at dialysis on 10/1.   Went to OR for surgical clean out and suture of wound. Required blood transfusion. Overall poor prognosis. Outpatient HD not possible with hypotension and immobility. ? LTACH for ongoing dialysis. Awaiting SW follow-up. Palliative medicine consultation for goals of care.    Assessment: ESRD on hemodialysis Ruptured hematoma of RLE Acute blood loss anemia Chronic hypotension Immobility and generalized deconditioning Hyperkalemia Hx of PE/DVT DM type 2 Left foot fracture Left ankle and pannus wounds Morbid obesity OSA/OHS  Recommendations/Plan:  DNR/DNI  Final hemodialysis on 10/13.   LTACH has unfortunately declined. Patient is not a candidate for outpatient hemodialysis with hypotension and immobility. Updated sisters who agree with transition to comfort focused care after final dialysis and hospice facility placement. TOC team notified. Awaiting [facility]  Place bed.  Start unrestricted visitor access.  Comfort feeds per patient/family request.  PRN comfort meds on MAR. Add Fentanyl TD.   Code Status:  DNR  Prognosis:  Poor prognosis likely <2 weeks with transition to comfort measures and discontinuation of hemodialysis.   Discharge Planning:  Hospice facility  Care plan was discussed with RN, hospice liaison, Dr. Horris Latino  Thank you for allowing the Palliative Medicine Team to assist in the care of this patient.  Total time spent:  25  Greater than 50% of this time was spent counseling and coordinating care related to the above assessment and plan.   Ihor Dow, DNP, FNP-C Palliative Medicine Team  Phone: [phone] Fax: [phone]  Please contact Palliative MedicineTeam phone at [phone] for questions and concerns between 7 am - 7 pm.   Please see AMION for individual provider pager numbers.

## 2020-05-24 NOTE — TOC Progression Note (Signed)
Transition of Care [facility]) - Progression Note    Patient Details  Name: Suzanne Levy MRN: [id number] Date of Birth: 1950/10/18  Transition [facility]) CM/SW Contact  Joanne Chars, LCSW Phone Number: 05/24/2020, 12:15 PM  Clinical Narrative:   CSW spoke with pt sister [name], discussed recommendation for Hospice services and choice.  [name] would like to use [facility]. [name] and her sister from [place] will both be at the hospital this afternoon after 230.  CSW wll call [facility] and ask them to reach out to them.  CSW spoke with Chrislyn King of [facility] and made referral.  She will reach out to family.     Expected Discharge Plan: [place] Barriers to Discharge: Continued Medical Work up  Expected Discharge Plan and Services Expected Discharge Plan: [place]       Living arrangements for the past 2 months: Single Family Home                                       Social Determinants of Health (SDOH) Interventions    Readmission Risk Interventions Readmission Risk Prevention Plan 03/01/2020  Transportation Screening Complete  Medication Review [employer]) Complete  PCP or Specialist appointment within 3-5 days of discharge Complete  HRI or Home Care Consult Complete  SW Recovery Care/Counseling Consult Complete  Palliative Care Screening Complete  Skilled Nursing Facility Complete  Some recent data might be hidden

## 2020-05-24 NOTE — Progress Notes (Signed)
Tele called and said pt had 2 runs of SVT @918am  HR 167, 1039am HR 186. MD made aware, pt is comfort care. MD discontinued cardiac monitoring.

## 2020-05-24 NOTE — Progress Notes (Signed)
[facility] ROUNDING NOTE   Subjective:   Brief history: 69 year old female morbid obesity end-stage renal disease Monday Wednesday Friday history of pulmonary embolus on Eliquis insulin-dependent diabetes mellitus type 2, hypertension hyperlipidemia asthma admitted with ruptured hematoma right lower leg.  Status post evacuation of hematoma cauterization of bleeding area and closure of wound by general surgery 05/14/2020. Tolerated dialysis 2.5 L removed 05/23/2020.  However it appears that patient is transitioning to comfort care.  I agree with this.  She is not a candidate for outpatient dialysis due to hypotension.  She is to be discharged to the hospice facility  Patient has been seen and assessed by palliative medicine appreciate assistance.  Appears to been turned down for [facility] and [facility]'s.  Blood pressure blood pressure 91/80 pulse 106 temperature 99 O2 sats 99% 4 L  Sodium 133 potassium 4 chloride 96 CO2 24 BUN 37 creatinine 5.65 glucose 139 calcium 8.6 hemoglobin 7.7  Hectorol 2 mcg Monday Wednesday Friday, insulin sliding scale, Levemir 20 units daily, midodrine 10 mg 3 times daily singular 10 mg daily pravastatin 40 mg daily    Objective:  Vital signs in last 24 hours:  Temp:  [97.6 F (36.4 C)-99 F (37.2 C)] 99 F (37.2 C) (10/13 1940) Pulse Rate:  [51-107] 95 (10/13 2300) Resp:  [16-21] 20 (10/13 2300) BP: (81-142)/(26-94) 91/80 (10/13 1940) SpO2:  [99 %-100 %] 100 % (10/13 2300) Weight:  [0.03 kg] 0.03 kg (10/14 0103)  Weight change: 0 kg Filed Weights   05/23/20 0111 05/23/20 2142 05/24/20 0103  Weight: (!) 0.03 kg (!) 0.03 kg (!) 0.03 kg    Intake/Output: I/O last 3 completed shifts: In: 360 [P.O.:360] Out: 2500 [Other:2500]   Intake/Output this shift:  No intake/output data recorded.  General:Obese female, nad Lungs: Clear, anteriorly, diminished breath sounds.Breathing is unlabored. Heart:RRR with S1 S2 Abdomen: softobese,  non-tender Lower extremities:R leg in bandage;mild-mod bilat pitting LE edema  Neuro: A &O X 3. Moves all extremities spontaneously. Dialysis Access:LUEAVG+bruit; some bruising to LUE   Basic Metabolic Panel: Recent Labs  Lab 05/18/20 0013 05/18/20 0013 05/19/20 0052 05/19/20 0052 05/21/20 0123 05/22/20 0126 05/23/20 0407  NA 130*  --  135  --  130* 135 133*  K 3.5  --  4.0  --  4.9 3.5 4.0  CL 92*  --  95*  --  93* 97* 96*  CO2 23  --  25  --  22 27 24   GLUCOSE 183*  --  149*  --  227* 170* 139*  BUN 39*  --  21  --  43* 26* 37*  CREATININE 5.46*  --  3.66*  --  5.88* 4.34* 5.65*  CALCIUM 8.9   < > 8.8*   < > 9.1 8.6* 8.6*   < > = values in this interval not displayed.    Liver Function Tests: No results for input(s): AST, ALT, ALKPHOS, BILITOT, PROT, ALBUMIN in the last 168 hours. No results for input(s): LIPASE, AMYLASE in the last 168 hours. No results for input(s): AMMONIA in the last 168 hours.  CBC: Recent Labs  Lab 05/18/20 0013 05/19/20 0052 05/21/20 0123 05/22/20 0126 05/23/20 0407  WBC 12.4* 10.6* 10.6* 10.1 9.7  NEUTROABS 10.3* 9.5* 9.5* 8.9* 8.7*  HGB 7.8* 8.3* 8.2* 7.2* 7.7*  HCT 25.0* 27.3* 26.9* 23.6* 25.0*  MCV 98.0 98.9 97.8 97.5 96.2  PLT 127* 130* 173 PLATELET CLUMPS NOTED ON SMEAR, COUNT APPEARS ADEQUATE 120*    Cardiac Enzymes: No  results for input(s): CKTOTAL, CKMB, CKMBINDEX, TROPONINI in the last 168 hours.  BNP: Invalid input(s): POCBNP  CBG: Recent Labs  Lab 05/23/20 1219 05/23/20 1807 05/23/20 1943 05/23/20 2311 05/24/20 0301  GLUCAP 137* 107* 111* 103* 15    Microbiology: Results for orders placed or performed during the hospital encounter of 05/13/20  Respiratory Panel by RT PCR (Flu A&B, Covid) - Nasopharyngeal Swab     Status: None   Collection Time: 05/13/20  5:07 PM   Specimen: Nasopharyngeal Swab  Result Value Ref Range Status   SARS Coronavirus 2 by RT PCR NEGATIVE NEGATIVE Final    Comment:  (NOTE) SARS-CoV-2 target nucleic acids are NOT DETECTED.  The SARS-CoV-2 RNA is generally detectable in upper respiratoy specimens during the acute phase of infection. The lowest concentration of SARS-CoV-2 viral copies this assay can detect is 131 copies/mL. A negative result does not preclude SARS-Cov-2 infection and should not be used as the sole basis for treatment or other patient management decisions. A negative result may occur with  improper specimen collection/handling, submission of specimen other than nasopharyngeal swab, presence of viral mutation(s) within the areas targeted by this assay, and inadequate number of viral copies (<131 copies/mL). A negative result must be combined with clinical observations, patient history, and epidemiological information. The expected result is Negative.  Fact Sheet for Patients:  PinkCheek.be  Fact Sheet for Healthcare Providers:  GravelBags.it  This test is no t yet approved or cleared by the [place] FDA and  has been authorized for detection and/or diagnosis of SARS-CoV-2 by FDA under an Emergency Use Authorization (EUA). This EUA will remain  in effect (meaning this test can be used) for the duration of the COVID-19 declaration under Section 564(b)(1) of the Act, 21 U.S.C. section 360bbb-3(b)(1), unless the authorization is terminated or revoked sooner.     Influenza A by PCR NEGATIVE NEGATIVE Final   Influenza B by PCR NEGATIVE NEGATIVE Final    Comment: (NOTE) The Xpert Xpress SARS-CoV-2/FLU/RSV assay is intended as an aid in  the diagnosis of influenza from Nasopharyngeal swab specimens and  should not be used as a sole basis for treatment. Nasal washings and  aspirates are unacceptable for Xpert Xpress SARS-CoV-2/FLU/RSV  testing.  Fact Sheet for Patients: PinkCheek.be  Fact Sheet for Healthcare  Providers: GravelBags.it  This test is not yet approved or cleared by the [place] FDA and  has been authorized for detection and/or diagnosis of SARS-CoV-2 by  FDA under an Emergency Use Authorization (EUA). This EUA will remain  in effect (meaning this test can be used) for the duration of the  Covid-19 declaration under Section 564(b)(1) of the Act, 21  U.S.C. section 360bbb-3(b)(1), unless the authorization is  terminated or revoked. Performed at [facility], [place] [address] [place], [place] [address]     Coagulation Studies: No results for input(s): LABPROT, INR in the last 72 hours.  Urinalysis: No results for input(s): COLORURINE, LABSPEC, PHURINE, GLUCOSEU, HGBUR, BILIRUBINUR, KETONESUR, PROTEINUR, UROBILINOGEN, NITRITE, LEUKOCYTESUR in the last 72 hours.  Invalid input(s): APPERANCEUR    Imaging: No results found.   Medications:    . Chlorhexidine Gluconate Cloth  6 each Topical Q0600  . Chlorhexidine Gluconate Cloth  6 each Topical Q0600  . collagenase   Topical Daily  . doxercalciferol  2 mcg Intravenous Q M,W,F-HD  . insulin aspart  0-6 Units Subcutaneous Q4H  . insulin detemir  20 Units Subcutaneous Daily  . midodrine  10 mg  Oral TID WC  . montelukast  10 mg Oral Daily  . pravastatin  40 mg Oral Daily   acetaminophen **OR** acetaminophen, albuterol, hydrOXYzine, ondansetron **OR** ondansetron (ZOFRAN) IV, oxyCODONE, traMADol  Assessment/ Plan:  Outpatient hemodialysis: [facility] Monday Wednesday Friday   4h 56min 500/800   156kg  2K/2.25Ca   L AVG   Heparin none Hectorol 2  Venofer 50 q week Mircera 225 (last 9/27)  1. Traumatic hematoma of RLE - s/p surgical evacuation 10/4. Being f/b gen surgery. Lots of leg pain. Appreciate gen surg assistance.  2. ESRD -HD MWF.  She appears to have tolerated dialysis 05/23/2020 with 2.5 L removed.  However it is felt that she is not a candidate for outpatient dialysis and  patient is to be transitioned to palliative care and facility hospice 3. Volume- Chronic hypotension on midodrine. Significant LE edema unable to pull volume due to BP drops on HD, patient did continue to have some fluid removal on 05/23/2020 but continues to be hypotensive. Midodrine started here at 10 mg tid.  Palliative medicine involved due to poor prognosis  4. Anemia - Post op blood loss noted. S/p 2 u prbcs 40/3.   5. Metabolic bone disease -Ca/phos ok.  Continue binders/Hectorol. 6. Nutrition -Renal diet/vitalmins 7. Hx PE- Holding Eliquis 8. H/o blindness 9. IDDM - per primary 10. Morbid obesity/ debility / WC dependent  11. Prognosis - pt is now DNR, patient now transitioning to palliative medicine and hospice care.   LOS: [place] @TODAY @7 :11 AM

## 2020-05-24 NOTE — Progress Notes (Signed)
[employer]ufacturing [employer] [facility]) Hospital Liaison note.    Received request from [facility] for family interest in [facility]. [facility] is unable to offer a room today. Hospital Liaison will follow up tomorrow or sooner if a room becomes available.   Spoke with sister Deri Fuelling by phone to confirm interest in [facility] bed.  Plan made for family meeting at bedside at 3pm today.   Please do not hesitate to call with questions. Thank you for the opportunity to participate in this patient's care.  Chrislyn Edison Pace, BSN, RN [facility] Liaison (listed on AMION under Hospice/[facility])    [phone] (24h on call)

## 2020-05-25 DIAGNOSIS — E785 Hyperlipidemia, unspecified: Secondary | ICD-10-CM

## 2020-05-25 DIAGNOSIS — E1169 Type 2 diabetes mellitus with other specified complication: Secondary | ICD-10-CM

## 2020-05-25 NOTE — Progress Notes (Signed)
Daily Progress Note   Patient Name: Suzanne Levy       Date: 05/25/2020 DOB: 1951-03-21  Age: 69 y.o. MRN#: [id number] Attending Physician: Alma Friendly, MD Primary Care Physician: [name], MD Admit Date: 05/13/2020  Reason for Consultation/Follow-up: Goals of care/terminal care  Subjective: Patient resting comfortably this AM. Recently give IV dilaudid. Fentanyl TD started yesterday.   No family at bedside. Discussed with RN.   Spoke with both sisters [name] and [name]) via telephone to provide update. Answered questions. Waiting on hospice bed. Family has PMT contact information.   Length of Stay: 12   Vital Signs: BP 105/65 (BP Location: Right Arm)   Pulse (!) 108   Temp 98.9 F (37.2 C)   Resp 20   Ht 5\' 11"  (1.803 m)   Wt (!) 0.03 kg   LMP  (LMP Unknown)   SpO2 99%   BMI 0.01 kg/m  SpO2: SpO2: 99 % O2 Device: O2 Device: Nasal Cannula O2 Flow Rate: O2 Flow Rate (L/min): 4 L/min       Palliative Assessment/Data: 20%     Palliative Care Plan    Patient Profile/HPI:  69 y.o. female  with past medical history of legally blind, ESRD on HD, history of PE on Eliquis, IDDM 2, HTN/HLD, asthma, morbid obesity/wheelchair-bound admitted on 05/13/2020 with ruptured hematoma of the right lower leg after being hit by the automatic door at dialysis on 10/1.   Went to OR for surgical clean out and suture of wound. Required blood transfusion. Overall poor prognosis. Outpatient HD not possible with hypotension and immobility. ? LTACH for ongoing dialysis. Awaiting SW follow-up. Palliative medicine consultation for goals of care.   Assessment: ESRD on hemodialysis Ruptured hematoma of RLE Acute blood loss anemia Chronic hypotension Immobility and generalized  deconditioning Hyperkalemia Hx of PE/DVT DM type 2 Left foot fracture Left ankle and pannus wounds Morbid obesity OSA/OHS  Recommendations/Plan:  DNR/DNI  Final hemodialysis on 10/13.   LTACH has unfortunately declined. Patient is not a candidate for outpatient hemodialysis with hypotension and immobility. Updated sisters who agree with transition to comfort focused care after final dialysis and hospice facility placement. TOC team notified. Awaiting [facility] bed. [facility] following.   Unrestricted visitor access.  Comfort feeds per patient/family request.  PRN comfort meds on MAR. Add Fentanyl  TD.    Code Status:  DNR  Prognosis:  Poor prognosis likely <2 weeks with transition to comfort measures and discontinuation of hemodialysis.   Discharge Planning:  Hospice facility  Care plan was discussed with RN, sisters [name] and [name]), Dr. Horris Latino, hospice liaison   Thank you for allowing the Palliative Medicine Team to assist in the care of this patient.  Total time spent:  20  Greater than 50% of this time was spent counseling and coordinating care related to the above assessment and plan.   Ihor Dow, DNP, FNP-C Palliative Medicine Team  Phone: [phone] Fax: [phone]  Please contact Palliative MedicineTeam phone at [phone] for questions and concerns between 7 am - 7 pm.   Please see AMION for individual provider pager numbers.

## 2020-05-25 NOTE — Progress Notes (Signed)
[facility] ([facility]) Hospital Liaison note.  Unfortunately,  [facility] is unable to offer a room today. Hospital Liaison will follow up tomorrow or sooner if a room becomes available.   Please do not hesitate to call with questions.    Thank you for the opportunity to participate in this patient's care.  Chrislyn King, BSN, RN [facility] Hospital Liaison (listed on AMION under Hospice/[facility])    [phone]    (24h on call) 

## 2020-05-25 NOTE — Progress Notes (Signed)
Report called to [facility] around 4pm. Per facility, okay to leave IV in incase she needs IV comfort care meds. Awaiting [facility] transport.

## 2020-05-25 NOTE — Discharge Summary (Signed)
Discharge Summary  Suzanne Levy WEX:[id number] DOB: 1951-02-01  PCP: [name], MD  Admit date: 05/13/2020 Discharge date: 05/25/2020  Time spent: 40 mins   Recommendations for Outpatient Follow-up:  1. Residential hospice  Discharge Diagnoses:  Active Hospital Problems   Diagnosis Date Noted  . Traumatic hematoma of right lower leg 05/13/2020  . Pain   . Chronic hypotension   . Morbid obesity ([place])   . Wound infection   . Goals of care, counseling/discussion   . Palliative care by specialist   . Terminal care   . Bimalleolar ankle fracture, left, closed, initial encounter 05/15/2020  . Pressure injury of skin 05/14/2020  . Hematoma of left lower extremity   . History of pulmonary embolus (PE) 05/13/2020  . Acute blood loss anemia 05/13/2020  . Insulin dependent type 2 diabetes mellitus ([place]) 05/13/2020  . Hyperlipidemia associated with type 2 diabetes mellitus ([place]) 05/13/2020  . ESRD (end stage renal disease) ([place]) 02/29/2020  . Hyperkalemia 02/29/2020    Resolved Hospital Problems  No resolved problems to display.    Discharge Condition: Poor   Diet recommendation: Comfort feeds  Vitals:   05/24/20 2005 05/25/20 0839  BP: (!) 143/75 105/65  Pulse: (!) 102 (!) 108  Resp: 20 20  Temp: 98.3 F (36.8 C) 98.9 F (37.2 C)  SpO2: 99% 99%    History of present illness:  69 year old female, morbidly obese, with past medical history significant forESRD on MWF HD, history of PE on Eliquis, insulin-dependent type 2 diabetes,hypertension, hyperlipidemia, morbid obesity and asthma who is wheelchair-bound was admitted with ruptured hematoma of the right lower leg.  She underwent evacuation of the hematoma, cauterization of bleeding areas and closure of the wound by general surgery.  Due to further decline, patient was eventually transitioned to hospice and comfort care.    Today, patient appeared comfortable, in no distress.  Patient discharged to residential  hospice at [facility] Course:  Principal Problem:   Traumatic hematoma of right lower leg Active Problems:   Hyperkalemia   ESRD (end stage renal disease) ([place])   History of pulmonary embolus (PE)   Acute blood loss anemia   Insulin dependent type 2 diabetes mellitus ([place])   Hyperlipidemia associated with type 2 diabetes mellitus ([facility])   Pressure injury of skin   Hematoma of left lower extremity   Bimalleolar ankle fracture, left, closed, initial encounter   Wound infection   Goals of care, counseling/discussion   Palliative care by specialist   Terminal care   Chronic hypotension   Morbid obesity ([place])   Pain   Ruptured hematoma of the right lower extremity Occurred in the setting of anticoagulation use Status post evacuation of the hematoma, cauterization of the bleeding vessels and closure of the large wound by general surgery Transitioned to hospice and comfort care after dialysis on 05/23/2020  Acute on chronic blood loss anemia Due to above Status post 2 units packed red cells transfusion  End-stage renal disease, no longer on hemodialysis Outpatient hemodialysis would not be possible with her low blood pressure and immobility as per nephrology Patient switched to hospice with comfort care after last HD session on 05/23/2020, no longer on HD, currently hospice  Chronic hypotension  History of PE/DVT Discontinue Eliquis  Diabetes mellitus type 2  Thrombocytopenia  Morbid obesity/OSA/OHS  Left ankle and pannus wounds Recent admission at [facility] from 04/11/2020-04/21/2020 requiring debridement of right flank and groin wounds on 04/14/2020  Left foot fractures X-ray of the  left ankle is showing bimalleolar fracture.  Orthopedics recommended conservative management with CAM boot and office follow-up No further management  Generalized deconditioning Overall prognosis is very poor Palliative care consulted. CODE STATUS changed to  DNR [facility] refused patient, due to poor prognosis and no hope of recovery After further discussion with family, agreed to switch over to residential hospice/comfort care       Malnutrition Type:      Malnutrition Characteristics:      Nutrition Interventions:      Estimated body mass index is 0.01 kg/m as calculated from the following:   Height as of this encounter: 5\' 11"  (1.803 m).   Weight as of this encounter: 0.03 kg.    Procedures: Evacuation of hematoma from right leg, cauterization of bleeding areas, closure of 20 cm of wounds on 05/14/2020 by general surgery  Consultations:  Gen surg  Nephrology  Orthopedics  Palliative care    Discharge Exam: BP 105/65 (BP Location: Right Arm)   Pulse (!) 108   Temp 98.9 F (37.2 C)   Resp 20   Ht 5\' 11"  (1.803 m)   Wt (!) 0.03 kg   LMP  (LMP Unknown)   SpO2 99%   BMI 0.01 kg/m   General: NAD Cardiovascular: S1, S2 present Respiratory: Decreased breath sounds bilaterally    Discharge Instructions You were cared for by a hospitalist during your hospital stay. If you have any questions about your discharge medications or the care you received while you were in the hospital after you are discharged, you can call the unit and asked to speak with the hospitalist on call if the hospitalist that took care of you is not available. Once you are discharged, your primary care physician will handle any further medical issues. Please note that NO REFILLS for any discharge medications will be authorized once you are discharged, as it is imperative that you return to your primary care physician (or establish a relationship with a primary care physician if you do not have one) for your aftercare needs so that they can reassess your need for medications and monitor your lab values.  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 Allergen: BLOOD THINNERS, Other Reaction: eye hemorrhage, seafood, (can eat salmon), acid drinks like orange juice,grape fruitt juice   Aspirin Other (See Comments)   Eye hemorrhage   Buprenorphine Hcl Other (See Comments)   unknown   Duloxetine Other (See Comments)   unknown   Gentamicin Other (See Comments)   unknown   Hydrocodone Itching   Morphine And Related Other (See Comments)   unknown   Penicillins Other (See Comments)   unknown   Vancomycin Other (See Comments)   unknown   Cephalexin Rash   Cephalosporins Rash   Codeine Itching   Heparin Other (See Comments)   "depletes something in my system"   Oxycodone Itching  Tomato Rash      Medication List    STOP taking these medications   albuterol 108 (90 Base) MCG/ACT inhaler Commonly known as: VENTOLIN HFA   ascorbic acid 500 MG tablet Commonly known as: VITAMIN C   doxercalciferol 4 MCG/2ML injection Commonly known as: HECTOROL   doxycycline 100 MG capsule Commonly known as: VIBRAMYCIN   Eliquis 5 MG Tabs tablet Generic drug: apixaban   eucerin cream   fluticasone 50 MCG/ACT nasal spray Commonly known as: FLONASE   folic acid 1 MG tablet Commonly known as: FOLVITE   gabapentin 100 MG capsule Commonly known as: NEURONTIN   insulin aspart protamine- aspart (70-30) 100 UNIT/ML injection Commonly known as: NOVOLOG MIX 70/30   insulin NPH Human 100 UNIT/ML injection Commonly known as: NOVOLIN N   lanthanum 1000 MG chewable tablet Commonly known as: FOSRENOL   linezolid 600 MG tablet Commonly  known as: ZYVOX   melatonin 3 MG Tabs tablet   metoCLOPramide 5 MG tablet Commonly known as: REGLAN   midodrine 10 MG tablet Commonly known as: PROAMATINE   montelukast 10 MG tablet Commonly known as: SINGULAIR   multivitamin Tabs tablet   pantoprazole 40 MG tablet Commonly known as: PROTONIX   polyethylene glycol 17 g packet Commonly known as: MIRALAX / GLYCOLAX   pravastatin 40 MG tablet Commonly known as: PRAVACHOL   RA B-Complex Tabs   Voltaren 1 % Gel Generic drug: diclofenac Sodium   zinc sulfate 220 (50 Zn) MG capsule     TAKE these medications   acetaminophen 500 MG tablet Commonly known as: TYLENOL Take 1,000 mg by mouth 3 (three) times daily.   traMADol 50 MG tablet Commonly known as: ULTRAM Take 1-2 tablets (50-100 mg total) by mouth every 6 (six) hours. What changed: when to take this      Allergies  Allergen Reactions  . Enoxaparin Other (See Comments)    unknown  . 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 Ace Inhibitors Other (See Comments)    ESRD   . Other Other (See Comments)    Uncoded Allergy. Allergen: BLOOD THINNERS, Other Reaction: eye hemorrhage, seafood, (can eat salmon), acid drinks like orange juice,grape fruitt juice  . Aspirin Other (See Comments)    Eye hemorrhage  . Buprenorphine Hcl Other (See Comments)    unknown  . Duloxetine Other (See Comments)    unknown  . Gentamicin Other (See Comments)    unknown  . Hydrocodone Itching  . Morphine And Related Other (See Comments)    unknown  . Penicillins Other (See Comments)    unknown  . Vancomycin Other (See Comments)    unknown  . Cephalexin Rash  . Cephalosporins Rash  . Codeine Itching  . Heparin Other (See Comments)    "depletes something in my system"  . Oxycodone Itching  . Tomato Rash      The results of significant diagnostics from this hospitalization (including imaging,  microbiology, ancillary and laboratory) are listed below for reference.    Significant Diagnostic Studies: DG Tibia/Fibula Right  Result Date: 05/11/2020 CLINICAL DATA:  Status post trauma. EXAM: RIGHT TIBIA AND FIBULA - 2 VIEW COMPARISON:  February 28, 2020 FINDINGS: There is diffuse osteopenia. A comminuted fracture deformity is seen involving the proximal shaft of the right tibia. This is seen on the prior study. No significant callus formation is identified. A radiopaque surgical screw is  seen within the proximal right tibia. This is present on the prior study. There is no evidence of dislocation. Degenerative changes seen within the visualized portion of the right knee. There is marked severity vascular calcification. IMPRESSION: 1. Nonhealing comminuted fracture of the proximal right tibia. Electronically Signed   By: Virgina Norfolk M.D.   On: 05/11/2020 19:04   DG Ankle Complete Left  Result Date: 05/14/2020 CLINICAL DATA:  Osteomyelitis. EXAM: LEFT ANKLE COMPLETE - 3+ VIEW COMPARISON:  None. FINDINGS: Evaluation is significantly limited by patient positioning. There is severe osteopenia which limits detection of nondisplaced fractures. There are age-indeterminate fractures of the medial and lateral malleoli. There is an impacted fracture of the distal tibia. There is extensive surrounding soft tissue swelling. There is lucency about the medial malleolar fracture of unknown clinical significance. Extensive vascular calcifications are noted. There are areas of sclerosis involving the calcaneus which may represent heterogeneous osteopenia versus an insufficiency fracture. There is a large ankle joint effusion. There is a questionable nondisplaced fracture through the base of the fifth metatarsal. IMPRESSION: 1. Examination is severely limited by patient positioning and diffuse osteopenia. 2. There are age-indeterminate displaced fractures of the lateral and medial malleoli in addition to a probable  impacted fracture of the tibial plafond. Further evaluation with cross-sectional imaging would be useful for further evaluation given the presence of osteopenia. 3. There is extensive soft tissue swelling about the ankle with a probable large joint effusion. 4. There is a lucency at the level of the medial malleolar fracture. This is of unknown clinical significance. Follow-up with cross-sectional imaging is recommended. 5. Advanced vascular calcifications are noted. 6. Questionable nondisplaced fracture through the base of the fifth metatarsal. Electronically Signed   By: Constance Holster M.D.   On: 05/14/2020 21:09   US Abdomen Limited  Result Date: 05/15/2020 CLINICAL DATA:  Wound left lateral abdominal wall EXAM: ULTRASOUND LEFT LATERAL ABDOMINAL WALL COMPARISON:  None. FINDINGS: Longitudinal and transverse images were obtained in the left lateral abdominal wall soft tissue region. Ultrasound of this area shows soft tissue induration without mass or fluid collection. No abscess evident by ultrasound in this region. IMPRESSION: Soft tissue induration in area lateral left abdominal wall without well-defined mass or abscess. No abnormal fluid in this area. If further evaluation of this area is felt to be warranted, CT through this area, ideally with intravenous contrast, could be helpful to further evaluate. Electronically Signed   By: Lowella Grip III M.D.   On: 05/15/2020 11:52   DG Femur Min 2 Views Right  Result Date: 05/11/2020 CLINICAL DATA:  Right leg injury EXAM: RIGHT FEMUR 2 VIEWS COMPARISON:  CT abdomen pelvis 04/04/2020, contemporary tibia/fibula radiograph FINDINGS: Suboptimal AP projection of the hip and femur.Additionally, the imaging quality is also suboptimal due to patient's body habitus and diffuse osteopenia which severely limits accuracy for detection of subtle, nondisplaced injuries. Nonstandard projection of the proximal femur demonstrating a somewhat atypical appearance of the  femoral head neck junction. This is possibly rotational though should correlate for point tenderness. Some mild lateral swelling is present. At the knee, there is a large joint effusion and tibial and fibular fractures with extensive surrounding soft tissue swelling. Suspect some underlying erosive arthropathy as well limiting evaluation of the distal femoral condyles with a questionable step-off of the lateral femoral condyle. IMPRESSION: Radiographic evaluation is severely limited due to suboptimal projections, diffuse osteopenia and severe degenerative changes at both joints. If there is persisting concern for injury, this patient  warrants CT evaluation as fairly significant injuries could be obscured by these technical limitations. Tibia and fibular fractures are better detailed on dedicated radiographs. Electronically Signed   By: Lovena Le M.D.   On: 05/11/2020 19:06    Microbiology: Recent Results (from the past 240 hour(s))  Culture, blood (routine x 2)     Status: None (Preliminary result)   Collection Time: 05/23/20  8:31 AM   Specimen: BLOOD RIGHT HAND  Result Value Ref Range Status   Specimen Description BLOOD RIGHT HAND  Final   Special Requests   Final    BOTTLES DRAWN AEROBIC AND ANAEROBIC Blood Culture adequate volume   Culture   Final    NO GROWTH 2 DAYS Performed at [facility], [address] [place], [place] [address]    Report Status PENDING  Incomplete  Culture, blood (routine x 2)     Status: None (Preliminary result)   Collection Time: 05/23/20  8:51 AM   Specimen: BLOOD RIGHT HAND  Result Value Ref Range Status   Specimen Description BLOOD RIGHT HAND  Final   Special Requests   Final    BOTTLES DRAWN AEROBIC ONLY Blood Culture adequate volume   Culture   Final    NO GROWTH 2 DAYS Performed at [facility], [place] [address], [place], [place] [address]    Report Status PENDING  Incomplete     Labs: Basic Metabolic Panel: Recent Labs  Lab  05/19/20 0052 05/21/20 0123 05/22/20 0126 05/23/20 0407  NA 135 130* 135 133*  K 4.0 4.9 3.5 4.0  CL 95* 93* 97* 96*  CO2 25 22 27 24   GLUCOSE 149* 227* 170* 139*  BUN 21 43* 26* 37*  CREATININE 3.66* 5.88* 4.34* 5.65*  CALCIUM 8.8* 9.1 8.6* 8.6*   Liver Function Tests: No results for input(s): AST, ALT, ALKPHOS, BILITOT, PROT, ALBUMIN in the last 168 hours. No results for input(s): LIPASE, AMYLASE in the last 168 hours. No results for input(s): AMMONIA in the last 168 hours. CBC: Recent Labs  Lab 05/19/20 0052 05/21/20 0123 05/22/20 0126 05/23/20 0407  WBC 10.6* 10.6* 10.1 9.7  NEUTROABS 9.5* 9.5* 8.9* 8.7*  HGB 8.3* 8.2* 7.2* 7.7*  HCT 27.3* 26.9* 23.6* 25.0*  MCV 98.9 97.8 97.5 96.2  PLT 130* 173 PLATELET CLUMPS NOTED ON SMEAR, COUNT APPEARS ADEQUATE 120*   Cardiac Enzymes: No results for input(s): CKTOTAL, CKMB, CKMBINDEX, TROPONINI in the last 168 hours. BNP: BNP (last 3 results) No results for input(s): BNP in the last 8760 hours.  ProBNP (last 3 results) No results for input(s): PROBNP in the last 8760 hours.  CBG: Recent Labs  Lab 05/23/20 1807 05/23/20 1943 05/23/20 2311 05/24/20 0301 05/24/20 0719  GLUCAP 107* 111* 103* 92 95       Signed:  Alma Friendly, MD [facility] 05/25/2020, 12:40 PM

## 2020-05-25 NOTE — Progress Notes (Signed)
Patient resting comfortably transition to palliative care.  Last dialysis treatment 05/23/2020  Will sign off patient

## 2020-05-25 NOTE — Progress Notes (Signed)
Manufacturing engineer [facility]) Hospital Liaison note.    [facility] now able to offer a bed to Ms Haff after 3pm today.  Spoke with sister Ms Magnus Ivan to confirm interest. Family agreeable to transfer today. TOC aware.    will notify TOC when registration paperwork has been completed to arrange transport.   RN please call report to [phone].  Please be sure DNR form transports with pt.  Thank you for the opportunity to participate in this patient's care.  Domenic Moras, BSN, RN [facility] Liaison (listed on [facility] under Hospice/[facility])    [phone] (24h on call)

## 2020-05-25 NOTE — Plan of Care (Signed)

## 2020-05-25 NOTE — TOC Transition Note (Signed)
Transition of Care [facility]) - CM/SW Discharge Note   Patient Details  Name: Suzanne Levy MRN: [id number] Date of Birth: 1950/10/13  Transition of Care [facility]) CM/SW Contact:  Joanne Chars, LCSW Phone Number: 05/25/2020, 3:12 PM   Clinical Narrative:   Pt transferring to [facility], arranged by Domenic Moras.  RN call report to [phone].  Room assignment on arrival.     Final next level of care: [place] Barriers to Discharge: Barriers Resolved   Patient Goals and CMS Choice Patient states their goals for this hospitalization and ongoing recovery are:: "not sure"      Discharge Placement              Patient chooses bed at:  [facility]) Patient to be transferred to facility by: [place] Name of family member notified: [name], sister Patient and family notified of of transfer: 05/25/20  Discharge Plan and Services                                     Social Determinants of Health (SDOH) Interventions     Readmission Risk Interventions Readmission Risk Prevention Plan 03/01/2020  Transportation Screening Complete  Medication Review Press photographer) Complete  PCP or Specialist appointment within 3-5 days of discharge Complete  HRI or Home Care Consult Complete  SW Recovery Care/Counseling Consult Complete  Palliative Care Screening Complete  Skilled Nursing Facility Complete  Some recent data might be hidden

## 2020-05-28 LAB — CULTURE, BLOOD (ROUTINE X 2)
Culture: NO GROWTH
Culture: NO GROWTH
Special Requests: ADEQUATE
Special Requests: ADEQUATE

## 2020-06-05 ENCOUNTER — Encounter (HOSPITAL_BASED_OUTPATIENT_CLINIC_OR_DEPARTMENT_OTHER): Payer: Medicare Other | Admitting: Internal Medicine

## 2020-06-05 ENCOUNTER — Ambulatory Visit: Payer: Medicare Other | Admitting: Orthopaedic Surgery

## 2020-06-11 DEATH — deceased

## 2022-08-04 IMAGING — CT CT ABD-PELV W/ CM
2 of 5 series · 15 of 46 positions shown, 17 images · IV contrast (Omni 300)
Comparison: None.

CLINICAL DATA: Bilateral flank pain. Right left lower quadrant
pain. Patient reports wound with packing and right side of abdomen.
Dialysis patient.

EXAM:
CT ABDOMEN AND PELVIS WITH CONTRAST
TECHNIQUE: Multidetector CT imaging of the abdomen and pelvis was performed
using the standard protocol following bolus administration of
intravenous contrast.
CONTRAST:  100mL OMNIPAQUE IOHEXOL 300 MG/ML  SOLN

[Series 4: a/p w/ 5mm · axial · 0.98mm/px · z∈[+888,+1344]mm · 12 of 103 slices shown, 14 images]
[im 6/103  soft-tissue]
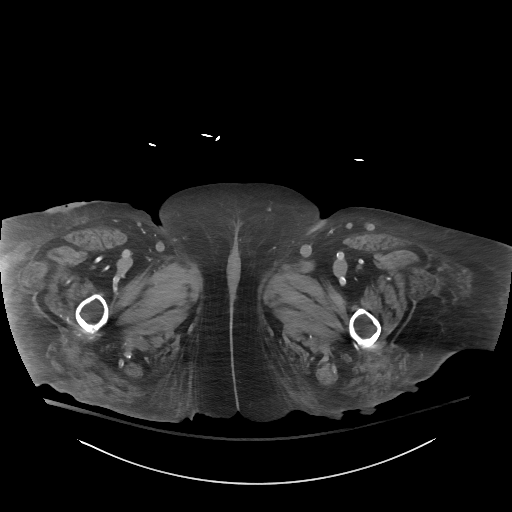
[im 6/103  bone]
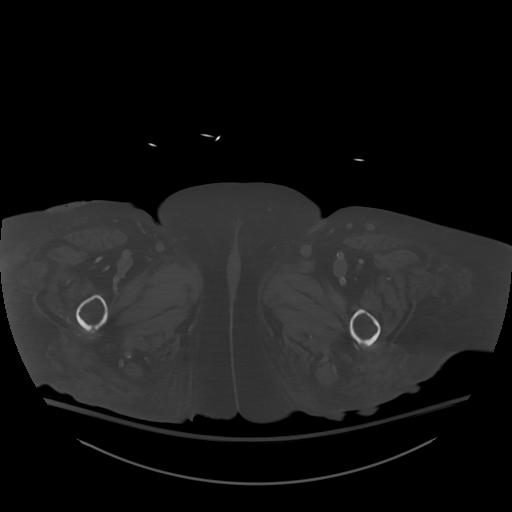
[im 16/103  soft-tissue]
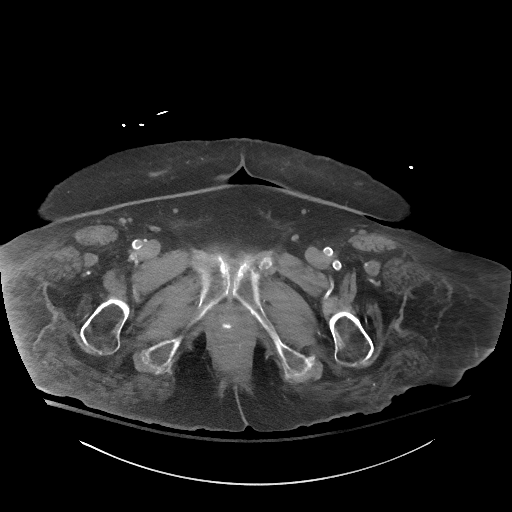
[im 21/103  soft-tissue]
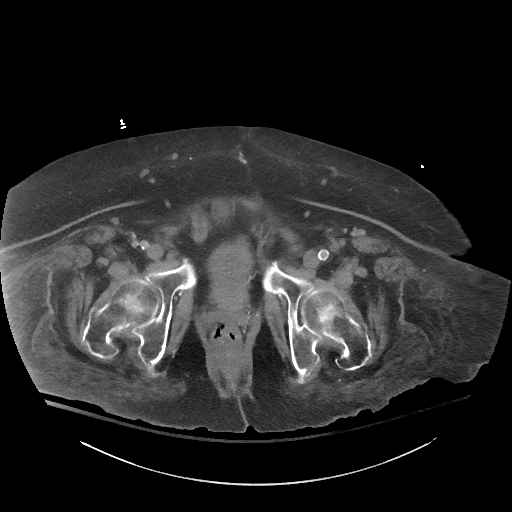
[im 31/103  soft-tissue]
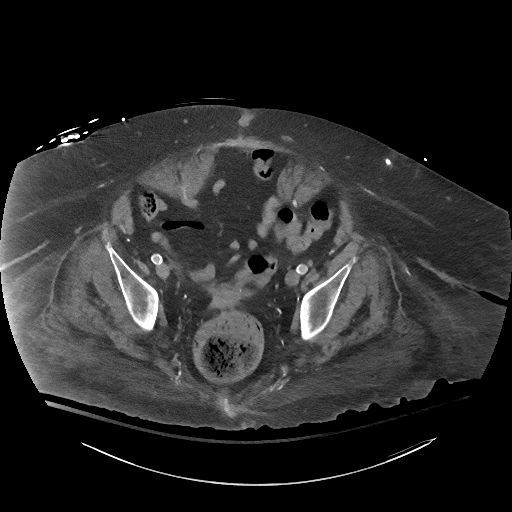
[im 41/103  soft-tissue]
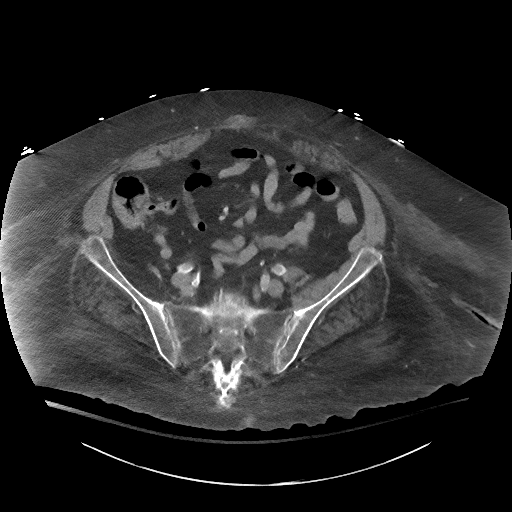
[im 46/103  soft-tissue]
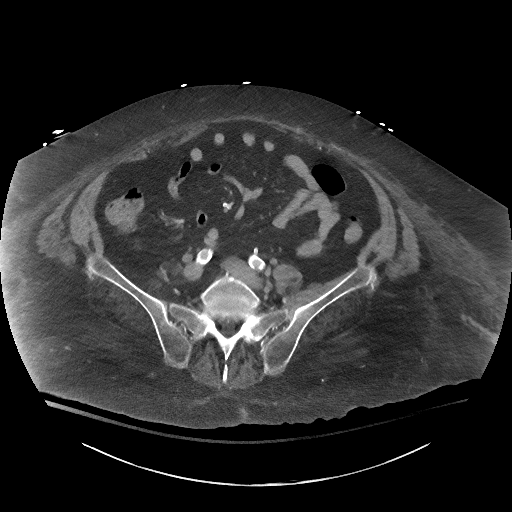
[im 57/103  soft-tissue]
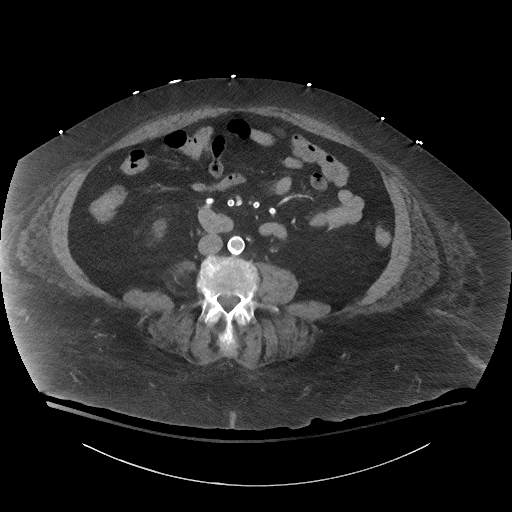
[im 62/103  soft-tissue]
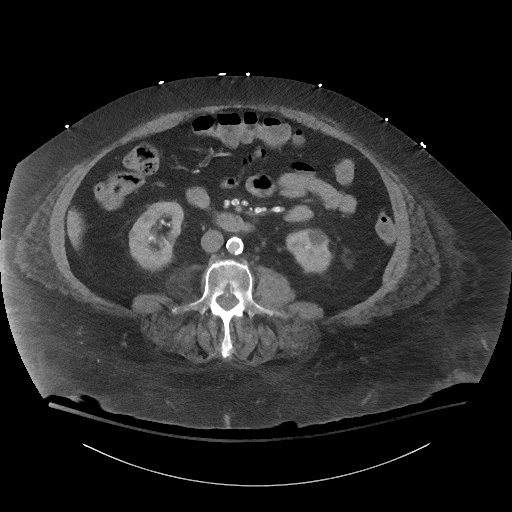
[im 72/103  soft-tissue]
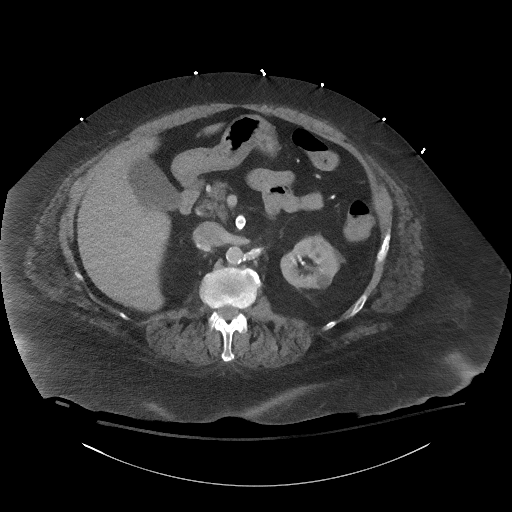
[im 72/103  bone]
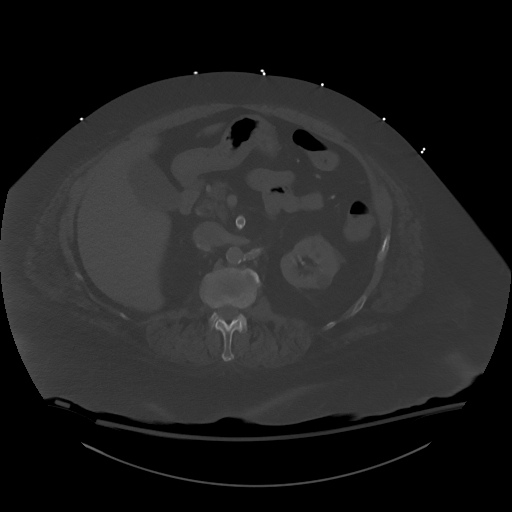
[im 82/103  soft-tissue]
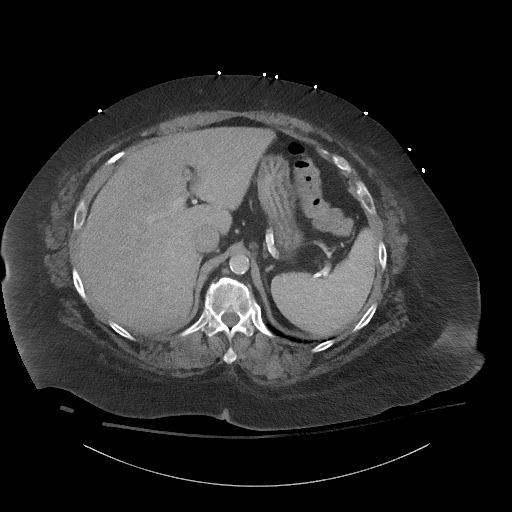
[im 87/103  soft-tissue]
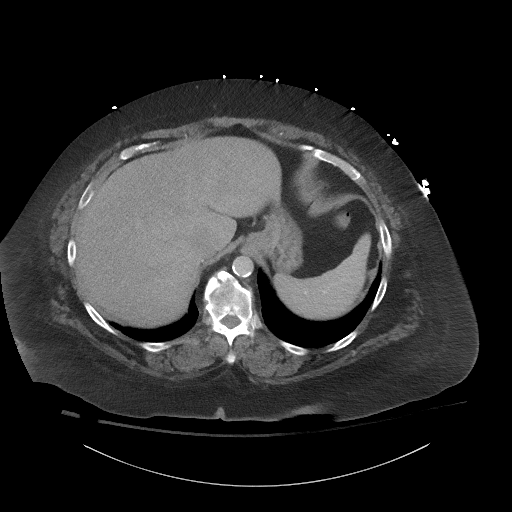
[im 97/103  soft-tissue]
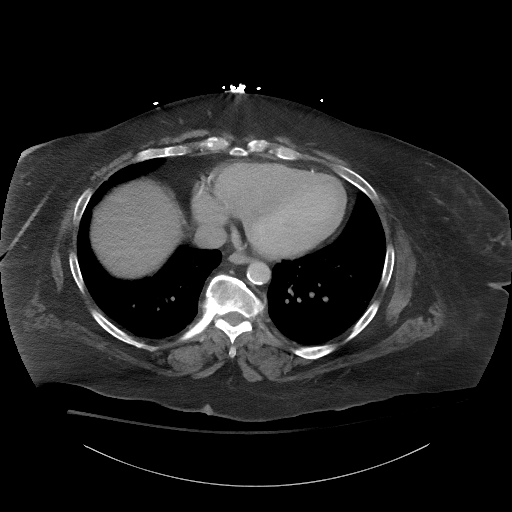

[Series 7: a/p w/ cor · coronal · 0.99mm/px · 3 of 191 slices shown]
[im 64/191  soft-tissue]
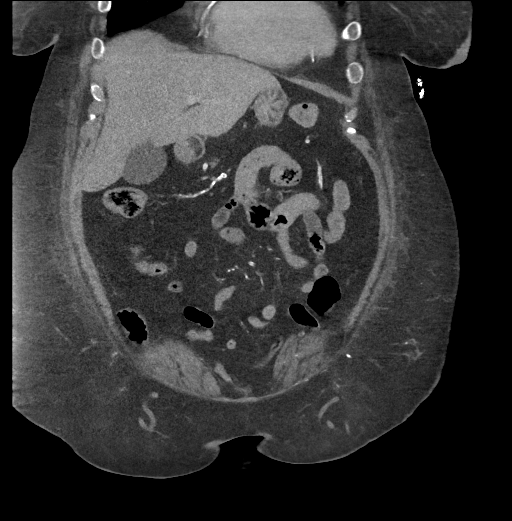
[im 85/191  soft-tissue]
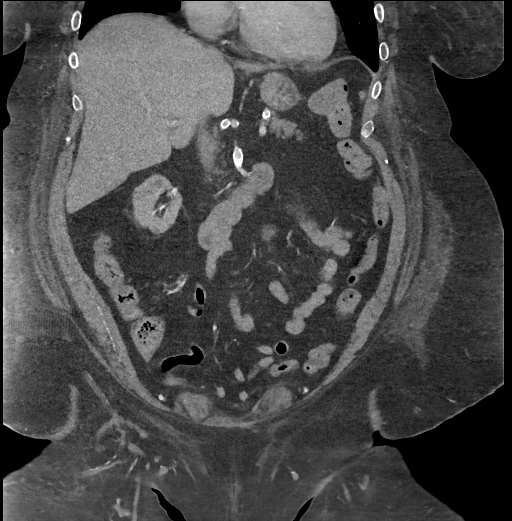
[im 106/191  soft-tissue]
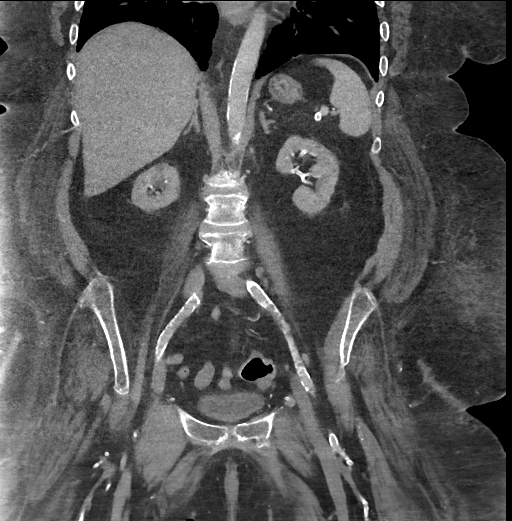

[15 of 46 positions shown; findings below may reference images not displayed]

FINDINGS: Lower chest: Mild cardiomegaly. Coronary artery calcifications.
There mitral annulus calcifications. 5 mm pleural based left lower
lobe pulmonary nodule, series 5, image 7. Slight heterogeneity of
pulmonary parenchyma in the lung bases. No pleural fluid.

Hepatobiliary: Enlarged liver spanning 20.9 cm cranial caudal with
mild hepatic steatosis. No focal hepatic abnormality. Gallbladder
physiologically distended, no calcified stone. No biliary
dilatation.

Pancreas: Mild parenchymal atrophy. No ductal dilatation or
inflammation.

Spleen: Normal in size without focal abnormality.

Adrenals/Urinary Tract: No adrenal nodule. Mild bilateral renal
parenchymal thinning with multiple low-density lesions in both
kidneys, larger lesions recommend simple cysts, smaller lesions are
too small to characterize. Hilar calcifications appear vascular.
There is no hydronephrosis. Absent renal excretion on delayed phase
imaging consistent with chronic renal disease. Nondistended urinary
bladder with wall thickening. No bladder air.

Stomach/Bowel: Nondistended stomach. There is no small bowel wall
thickening, obstruction, or inflammation. Appendix not definitively
visualized, no evidence of appendicitis. Small volume of stool
throughout the colon. No colonic wall thickening no colonic
inflammation. There is stool in the rectum with mild rectal
distention of 7.2 cm. No rectal wall thickening or perirectal edema.

Vascular/Lymphatic: Diffuse advanced aortic and branch
atherosclerosis. No aortic aneurysm. No abdominopelvic adenopathy.

Reproductive: Status post hysterectomy. No adnexal masses.

Other: No ascites. No free air. No evidence of intra-abdominal
abscess. Soft tissue calcifications at the umbilicus, of unknown
significance. Patchy soft tissue calcifications in the left lower
anterior abdominal wall may be related to medication injection site.
No obvious abdominal wall wound. Portions of the right and left
flank soft tissues are excluded from the field of view given
habitus. There is minimal skin thickening involving the anterior
upper right thigh, series 4, image 99.

Musculoskeletal: Diffuse degenerative change throughout the spine.
No acute osseous abnormalities are seen. Bones are under
mineralized. There is fatty atrophy of the right ileopsoas muscle.
IMPRESSION: 1. Minimal skin thickening involving the anterior upper right thigh,
may represent cellulitis. No focal fluid collection or abscess.
Portions of the right and left flank soft tissues are excluded from
the field of view given habitus.
2. No explanation for flank pain.
3. Mild urinary bladder wall thickening, likely chronic in the
setting of chronic renal disease.
4. Hepatomegaly and hepatic steatosis.
5. A 5 mm pleural based left lower lobe pulmonary nodule. In a low
risk patient, no further follow-up is needed. In a high-risk
patient, recommend 12 month follow-up chest CT.
6. Advanced aortic and branch atherosclerosis. No acute vascular
findings.

Aortic Atherosclerosis ([id number]).
